# Patient Record
Sex: Female | Born: 1950 | Race: White | Hispanic: No | Marital: Married | Smoking: Current every day smoker
Health system: Southern US, Community
[De-identification: ages and names within clinical notes are randomized; demographics above are authoritative.]

## PROBLEM LIST (undated history)

## (undated) DIAGNOSIS — R51 Headache: Secondary | ICD-10-CM

## (undated) DIAGNOSIS — G459 Transient cerebral ischemic attack, unspecified: Secondary | ICD-10-CM

## (undated) DIAGNOSIS — E039 Hypothyroidism, unspecified: Secondary | ICD-10-CM

## (undated) DIAGNOSIS — R519 Headache, unspecified: Secondary | ICD-10-CM

## (undated) DIAGNOSIS — M1990 Unspecified osteoarthritis, unspecified site: Secondary | ICD-10-CM

## (undated) DIAGNOSIS — R112 Nausea with vomiting, unspecified: Secondary | ICD-10-CM

## (undated) DIAGNOSIS — Z9889 Other specified postprocedural states: Secondary | ICD-10-CM

## (undated) DIAGNOSIS — F419 Anxiety disorder, unspecified: Secondary | ICD-10-CM

## (undated) DIAGNOSIS — J449 Chronic obstructive pulmonary disease, unspecified: Secondary | ICD-10-CM

## (undated) DIAGNOSIS — I609 Nontraumatic subarachnoid hemorrhage, unspecified: Secondary | ICD-10-CM

## (undated) DIAGNOSIS — C519 Malignant neoplasm of vulva, unspecified: Secondary | ICD-10-CM

## (undated) DIAGNOSIS — I10 Essential (primary) hypertension: Secondary | ICD-10-CM

## (undated) DIAGNOSIS — I639 Cerebral infarction, unspecified: Secondary | ICD-10-CM

## (undated) DIAGNOSIS — E785 Hyperlipidemia, unspecified: Secondary | ICD-10-CM

## (undated) DIAGNOSIS — T884XXA Failed or difficult intubation, initial encounter: Secondary | ICD-10-CM

## (undated) DIAGNOSIS — G8929 Other chronic pain: Secondary | ICD-10-CM

## (undated) HISTORY — PX: BREAST SURGERY: SHX581

## (undated) HISTORY — PX: EYE SURGERY: SHX253

## (undated) HISTORY — DX: Nontraumatic subarachnoid hemorrhage, unspecified: I60.9

## (undated) HISTORY — PX: PLACEMENT OF BREAST IMPLANTS: SHX6334

## (undated) HISTORY — PX: ABDOMINAL HYSTERECTOMY: SHX81

## (undated) HISTORY — PX: TOTAL HIP ARTHROPLASTY: SHX124

## (undated) HISTORY — PX: JOINT REPLACEMENT: SHX530

## (undated) HISTORY — PX: THYROIDECTOMY, PARTIAL: SHX18

## (undated) HISTORY — PX: TONSILLECTOMY: SUR1361

---

## 2001-04-29 ENCOUNTER — Emergency Department (HOSPITAL_COMMUNITY): Admission: EM | Admit: 2001-04-29 | Discharge: 2001-04-29 | Payer: Self-pay | Admitting: Emergency Medicine

## 2002-11-19 ENCOUNTER — Ambulatory Visit (HOSPITAL_COMMUNITY): Admission: RE | Admit: 2002-11-19 | Discharge: 2002-11-19 | Payer: Self-pay | Admitting: General Surgery

## 2002-11-19 ENCOUNTER — Encounter: Payer: Self-pay | Admitting: General Surgery

## 2002-11-20 ENCOUNTER — Encounter: Payer: Self-pay | Admitting: General Surgery

## 2002-11-20 ENCOUNTER — Ambulatory Visit (HOSPITAL_COMMUNITY): Admission: RE | Admit: 2002-11-20 | Discharge: 2002-11-20 | Payer: Self-pay | Admitting: General Surgery

## 2003-01-05 ENCOUNTER — Encounter (HOSPITAL_COMMUNITY): Admission: RE | Admit: 2003-01-05 | Discharge: 2003-02-04 | Payer: Self-pay | Admitting: Family Medicine

## 2003-04-08 ENCOUNTER — Emergency Department (HOSPITAL_COMMUNITY): Admission: EM | Admit: 2003-04-08 | Discharge: 2003-04-08 | Payer: Self-pay | Admitting: Emergency Medicine

## 2003-04-08 ENCOUNTER — Encounter: Payer: Self-pay | Admitting: Emergency Medicine

## 2005-03-08 ENCOUNTER — Emergency Department: Payer: Self-pay | Admitting: Emergency Medicine

## 2006-10-04 ENCOUNTER — Emergency Department: Payer: Self-pay | Admitting: Emergency Medicine

## 2006-10-15 DIAGNOSIS — G459 Transient cerebral ischemic attack, unspecified: Secondary | ICD-10-CM

## 2006-10-15 HISTORY — DX: Transient cerebral ischemic attack, unspecified: G45.9

## 2006-10-17 ENCOUNTER — Ambulatory Visit: Payer: Self-pay | Admitting: Podiatry

## 2008-04-26 ENCOUNTER — Ambulatory Visit: Payer: Self-pay | Admitting: Internal Medicine

## 2008-08-15 ENCOUNTER — Ambulatory Visit: Payer: Self-pay | Admitting: Internal Medicine

## 2008-08-17 ENCOUNTER — Ambulatory Visit: Payer: Self-pay | Admitting: Internal Medicine

## 2008-11-09 ENCOUNTER — Ambulatory Visit: Payer: Self-pay | Admitting: Internal Medicine

## 2008-11-21 ENCOUNTER — Ambulatory Visit: Payer: Self-pay | Admitting: Family Medicine

## 2008-11-30 ENCOUNTER — Ambulatory Visit: Payer: Self-pay | Admitting: Internal Medicine

## 2008-12-07 ENCOUNTER — Ambulatory Visit: Payer: Self-pay

## 2009-01-03 ENCOUNTER — Ambulatory Visit: Payer: Self-pay

## 2009-01-03 ENCOUNTER — Encounter (INDEPENDENT_AMBULATORY_CARE_PROVIDER_SITE_OTHER): Payer: Self-pay | Admitting: Neurology

## 2009-03-11 ENCOUNTER — Ambulatory Visit: Payer: Self-pay | Admitting: Internal Medicine

## 2009-07-11 ENCOUNTER — Ambulatory Visit: Payer: Self-pay | Admitting: Neurology

## 2010-02-20 ENCOUNTER — Ambulatory Visit: Payer: Self-pay | Admitting: Internal Medicine

## 2010-08-29 ENCOUNTER — Ambulatory Visit: Payer: Self-pay | Admitting: Internal Medicine

## 2010-09-17 ENCOUNTER — Emergency Department: Payer: Self-pay | Admitting: Emergency Medicine

## 2010-09-19 ENCOUNTER — Emergency Department: Payer: Self-pay | Admitting: Emergency Medicine

## 2011-01-12 ENCOUNTER — Ambulatory Visit: Payer: Self-pay | Admitting: Internal Medicine

## 2011-01-18 ENCOUNTER — Ambulatory Visit: Payer: Self-pay | Admitting: Internal Medicine

## 2011-01-20 ENCOUNTER — Ambulatory Visit: Payer: Self-pay | Admitting: Family Medicine

## 2011-01-22 ENCOUNTER — Ambulatory Visit: Payer: Self-pay | Admitting: Internal Medicine

## 2011-06-14 ENCOUNTER — Ambulatory Visit: Payer: Self-pay

## 2012-05-27 ENCOUNTER — Emergency Department: Payer: Self-pay | Admitting: Emergency Medicine

## 2012-05-27 LAB — CBC WITH DIFFERENTIAL/PLATELET
Basophil #: 0.1 10*3/uL (ref 0.0–0.1)
Basophil %: 1.2 %
Eosinophil #: 0.2 10*3/uL (ref 0.0–0.7)
Eosinophil %: 2.1 %
HCT: 43.5 % (ref 35.0–47.0)
HGB: 14.8 g/dL (ref 12.0–16.0)
Lymphocyte #: 3.1 10*3/uL (ref 1.0–3.6)
Lymphocyte %: 38.3 %
MCH: 33.5 pg (ref 26.0–34.0)
MCHC: 33.9 g/dL (ref 32.0–36.0)
MCV: 99 fL (ref 80–100)
Monocyte #: 0.8 x10 3/mm (ref 0.2–0.9)
Monocyte %: 9.4 %
Neutrophil #: 4 10*3/uL (ref 1.4–6.5)
Neutrophil %: 49 %
Platelet: 273 10*3/uL (ref 150–440)
RBC: 4.4 10*6/uL (ref 3.80–5.20)
RDW: 13.4 % (ref 11.5–14.5)
WBC: 8.1 10*3/uL (ref 3.6–11.0)

## 2012-05-27 LAB — COMPREHENSIVE METABOLIC PANEL
Albumin: 3.8 g/dL (ref 3.4–5.0)
Alkaline Phosphatase: 209 U/L — ABNORMAL HIGH (ref 50–136)
Anion Gap: 10 (ref 7–16)
BUN: 13 mg/dL (ref 7–18)
Bilirubin,Total: 0.5 mg/dL (ref 0.2–1.0)
Calcium, Total: 8.9 mg/dL (ref 8.5–10.1)
Chloride: 105 mmol/L (ref 98–107)
Co2: 24 mmol/L (ref 21–32)
Creatinine: 0.78 mg/dL (ref 0.60–1.30)
EGFR (African American): >60
EGFR (Non-African Amer.): >60
Glucose: 104 mg/dL — ABNORMAL HIGH (ref 65–99)
Osmolality: 278 (ref 275–301)
Potassium: 3.1 mmol/L — ABNORMAL LOW (ref 3.5–5.1)
SGOT(AST): 21 U/L (ref 15–37)
SGPT (ALT): 17 U/L (ref 12–78)
Sodium: 139 mmol/L (ref 136–145)
Total Protein: 7.7 g/dL (ref 6.4–8.2)

## 2012-05-27 LAB — TROPONIN I: Troponin-I: <0.02 ng/mL

## 2012-05-27 LAB — TSH: Thyroid Stimulating Horm: 1.85 u[IU]/mL

## 2012-12-09 ENCOUNTER — Ambulatory Visit: Payer: Self-pay

## 2012-12-09 LAB — URINALYSIS, COMPLETE
Bilirubin,UR: NEGATIVE
Glucose,UR: NEGATIVE mg/dL (ref 0–75)
Ketone: NEGATIVE
Nitrite: NEGATIVE
Ph: 6.5 (ref 4.5–8.0)
Protein: NEGATIVE
Specific Gravity: 1.01 (ref 1.003–1.030)
WBC UR: >30 /HPF (ref 0–5)

## 2012-12-11 LAB — URINE CULTURE

## 2014-07-10 ENCOUNTER — Inpatient Hospital Stay: Payer: Self-pay | Admitting: Internal Medicine

## 2014-07-10 LAB — URINALYSIS, COMPLETE
BACTERIA: NONE SEEN
BILIRUBIN, UR: NEGATIVE
BLOOD: NEGATIVE
Glucose,UR: NEGATIVE mg/dL (ref 0–75)
KETONE: NEGATIVE
LEUKOCYTE ESTERASE: NEGATIVE
Nitrite: NEGATIVE
PROTEIN: NEGATIVE
Ph: 5 (ref 4.5–8.0)
RBC,UR: 1 /HPF (ref 0–5)
SQUAMOUS EPITHELIAL: NONE SEEN
Specific Gravity: 1.011 (ref 1.003–1.030)
WBC UR: <1 /HPF (ref 0–5)

## 2014-07-10 LAB — BASIC METABOLIC PANEL
Anion Gap: 7 (ref 7–16)
BUN: 17 mg/dL (ref 7–18)
CHLORIDE: 106 mmol/L (ref 98–107)
CREATININE: 1.03 mg/dL (ref 0.60–1.30)
Calcium, Total: 9 mg/dL (ref 8.5–10.1)
Co2: 25 mmol/L (ref 21–32)
EGFR (African American): >60
EGFR (Non-African Amer.): 57 — ABNORMAL LOW
Glucose: 90 mg/dL (ref 65–99)
Osmolality: 277 (ref 275–301)
POTASSIUM: 3.7 mmol/L (ref 3.5–5.1)
SODIUM: 138 mmol/L (ref 136–145)

## 2014-07-10 LAB — CBC WITH DIFFERENTIAL/PLATELET
BASOS ABS: 0.1 10*3/uL (ref 0.0–0.1)
BASOS PCT: 1.1 %
EOS PCT: 1.8 %
Eosinophil #: 0.1 10*3/uL (ref 0.0–0.7)
HCT: 40.3 % (ref 35.0–47.0)
HGB: 13.4 g/dL (ref 12.0–16.0)
LYMPHS ABS: 2.2 10*3/uL (ref 1.0–3.6)
LYMPHS PCT: 30.2 %
MCH: 34.4 pg — AB (ref 26.0–34.0)
MCHC: 33.2 g/dL (ref 32.0–36.0)
MCV: 104 fL — AB (ref 80–100)
Monocyte #: 0.6 x10 3/mm (ref 0.2–0.9)
Monocyte %: 7.7 %
NEUTROS ABS: 4.4 10*3/uL (ref 1.4–6.5)
Neutrophil %: 59.2 %
PLATELETS: 262 10*3/uL (ref 150–440)
RBC: 3.89 10*6/uL (ref 3.80–5.20)
RDW: 13.7 % (ref 11.5–14.5)
WBC: 7.5 10*3/uL (ref 3.6–11.0)

## 2014-07-10 LAB — PROTIME-INR
INR: 0.9
Prothrombin Time: 11.8 secs (ref 11.5–14.7)

## 2014-07-10 LAB — APTT: Activated PTT: 28.4 secs (ref 23.6–35.9)

## 2014-07-11 LAB — CBC WITH DIFFERENTIAL/PLATELET
Basophil #: 0.1 10*3/uL (ref 0.0–0.1)
Basophil %: 1.3 %
EOS PCT: 2.2 %
Eosinophil #: 0.1 10*3/uL (ref 0.0–0.7)
HCT: 37.5 % (ref 35.0–47.0)
HGB: 12.8 g/dL (ref 12.0–16.0)
LYMPHS PCT: 37.6 %
Lymphocyte #: 1.9 10*3/uL (ref 1.0–3.6)
MCH: 35.3 pg — ABNORMAL HIGH (ref 26.0–34.0)
MCHC: 34.1 g/dL (ref 32.0–36.0)
MCV: 104 fL — ABNORMAL HIGH (ref 80–100)
MONO ABS: 0.4 x10 3/mm (ref 0.2–0.9)
Monocyte %: 7.6 %
NEUTROS PCT: 51.3 %
Neutrophil #: 2.6 10*3/uL (ref 1.4–6.5)
Platelet: 246 10*3/uL (ref 150–440)
RBC: 3.62 10*6/uL — ABNORMAL LOW (ref 3.80–5.20)
RDW: 13.4 % (ref 11.5–14.5)
WBC: 5 10*3/uL (ref 3.6–11.0)

## 2014-07-11 LAB — BASIC METABOLIC PANEL
Anion Gap: 6 — ABNORMAL LOW (ref 7–16)
BUN: 12 mg/dL (ref 7–18)
CALCIUM: 7.8 mg/dL — AB (ref 8.5–10.1)
Chloride: 114 mmol/L — ABNORMAL HIGH (ref 98–107)
Co2: 23 mmol/L (ref 21–32)
Creatinine: 0.6 mg/dL (ref 0.60–1.30)
EGFR (African American): >60
GLUCOSE: 76 mg/dL (ref 65–99)
Osmolality: 283 (ref 275–301)
Potassium: 3.7 mmol/L (ref 3.5–5.1)
Sodium: 143 mmol/L (ref 136–145)

## 2014-07-12 LAB — CBC WITH DIFFERENTIAL/PLATELET
BASOS ABS: 0.1 10*3/uL (ref 0.0–0.1)
BASOS PCT: 0.7 %
EOS ABS: 0 10*3/uL (ref 0.0–0.7)
EOS PCT: 0.3 %
HCT: 36.2 % (ref 35.0–47.0)
HGB: 12.3 g/dL (ref 12.0–16.0)
Lymphocyte #: 1 10*3/uL (ref 1.0–3.6)
Lymphocyte %: 12.9 %
MCH: 35.2 pg — AB (ref 26.0–34.0)
MCHC: 34 g/dL (ref 32.0–36.0)
MCV: 104 fL — AB (ref 80–100)
Monocyte #: 0.7 x10 3/mm (ref 0.2–0.9)
Monocyte %: 8.9 %
NEUTROS ABS: 5.9 10*3/uL (ref 1.4–6.5)
Neutrophil %: 77.2 %
Platelet: 252 10*3/uL (ref 150–440)
RBC: 3.49 10*6/uL — ABNORMAL LOW (ref 3.80–5.20)
RDW: 13.5 % (ref 11.5–14.5)
WBC: 7.7 10*3/uL (ref 3.6–11.0)

## 2014-07-12 LAB — BASIC METABOLIC PANEL
Anion Gap: 7 (ref 7–16)
BUN: 6 mg/dL — ABNORMAL LOW (ref 7–18)
CALCIUM: 8.1 mg/dL — AB (ref 8.5–10.1)
CHLORIDE: 108 mmol/L — AB (ref 98–107)
Co2: 24 mmol/L (ref 21–32)
Creatinine: 0.53 mg/dL — ABNORMAL LOW (ref 0.60–1.30)
GLUCOSE: 137 mg/dL — AB (ref 65–99)
Osmolality: 277 (ref 275–301)
POTASSIUM: 4.1 mmol/L (ref 3.5–5.1)
Sodium: 139 mmol/L (ref 136–145)

## 2014-07-13 ENCOUNTER — Encounter: Payer: Self-pay | Admitting: Internal Medicine

## 2014-07-13 LAB — PATHOLOGY REPORT

## 2014-07-13 LAB — HEMOGLOBIN: HGB: 11.9 g/dL — ABNORMAL LOW (ref 12.0–16.0)

## 2014-07-15 ENCOUNTER — Encounter: Payer: Self-pay | Admitting: Internal Medicine

## 2014-07-17 LAB — URINALYSIS, COMPLETE
BILIRUBIN, UR: NEGATIVE
Blood: NEGATIVE
Glucose,UR: NEGATIVE mg/dL (ref 0–75)
Ketone: NEGATIVE
Leukocyte Esterase: NEGATIVE
NITRITE: NEGATIVE
Ph: 6 (ref 4.5–8.0)
Protein: NEGATIVE
RBC,UR: 19 /HPF (ref 0–5)
Specific Gravity: 1.018 (ref 1.003–1.030)
Squamous Epithelial: 3
WBC UR: NONE SEEN /HPF (ref 0–5)

## 2014-07-19 LAB — URINE CULTURE

## 2014-07-21 ENCOUNTER — Ambulatory Visit: Payer: Self-pay | Admitting: Internal Medicine

## 2014-08-03 ENCOUNTER — Encounter: Payer: Self-pay | Admitting: Emergency Medicine

## 2014-08-15 ENCOUNTER — Encounter: Payer: Self-pay | Admitting: Internal Medicine

## 2014-08-15 ENCOUNTER — Encounter: Payer: Self-pay | Admitting: Emergency Medicine

## 2014-09-14 ENCOUNTER — Encounter: Payer: Self-pay | Admitting: Internal Medicine

## 2014-09-14 ENCOUNTER — Encounter: Payer: Self-pay | Admitting: Emergency Medicine

## 2014-10-15 ENCOUNTER — Encounter: Payer: Self-pay | Admitting: Emergency Medicine

## 2014-10-15 ENCOUNTER — Encounter: Payer: Self-pay | Admitting: Internal Medicine

## 2015-02-05 NOTE — Discharge Summary (Signed)
PATIENT NAME:  Sydney Coleman, Sydney Coleman MR#:  [id number] DATE OF BIRTH:  09-Sep-1951  DATE OF ADMISSION:  07/10/2014 DATE OF DISCHARGE:  07/14/2104  DISCHARGE DIAGNOSES:  1.  Left hip fracture.  2.  Hypertension.  3.  History of cerebrovascular accident.  4.  Tobacco dependence.  PROCEDURE: Left hip hemiarthroplasty.  CONDITION: Stable.   CODE STATUS: Full code.   HOME MEDICATIONS: Please medication reconciliation list.   DIET: Low-sodium diet.   ACTIVITY: As tolerated.   FOLLOWUP CARE: Follow-up with PCP and Dr. Mack Guise within 1-2 weeks.   REASON FOR ADMISSION: Left hip pain.   CONSULTATIONS: Timoteo Gaul, MD.  HOSPITAL COURSE: The patient is a 64 year old Caucasian female with a history of hypertension, hyperlipidemia, history of cerebrovascular accident who presented to the ED with  left hip pain for 1 week, worsening Monday. The patient got a CT of the hip which showed subcapital fracture of the left femur. For detailed history and physical examination please refer to the admission note dictated by Dr. Tressia Miners.   ADMISSION LABORATORY DATA:  Unremarkable.   Chest x-ray showed vascular congestion and chronic bronchitis.   1.  For the left hip fracture, after admission Dr. Mack Guise did a hemiarthroplasty. After surgery, the patient was getting physical therapy, but she could not tolerate PT well. Physical therapy evaluation suggested the patient needed to go to a skilled nursing facility. Physical therapist suggested that the patient needed subacute rehabilitation.  2.  Hypertension, has been controlled.  3.  Tobacco abuse.   The patient has no complaints. Vital signs are stable. She is clinically stable and will be discharged to subacute rehabilitation today. I discussed the patient's discharge plan with the patient, the patient's husband, nurse and case Metallurgist.   TIME SPENT: About 36 minutes.    ____________________________ Demetrios Loll,  MD qc:lt D: 07/14/2014 10:31:59 ET T: 07/14/2014 10:58:23 ET JOB#: [id number]  cc: Demetrios Loll, MD, <Dictator> Demetrios Loll MD ELECTRONICALLY SIGNED 07/14/2014 13:10
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
## 2015-03-29 DIAGNOSIS — H18899 Other specified disorders of cornea, unspecified eye: Secondary | ICD-10-CM | POA: Diagnosis not present

## 2015-05-03 DIAGNOSIS — E780 Pure hypercholesterolemia: Secondary | ICD-10-CM | POA: Diagnosis not present

## 2015-05-03 DIAGNOSIS — E782 Mixed hyperlipidemia: Secondary | ICD-10-CM | POA: Diagnosis not present

## 2015-05-03 DIAGNOSIS — Z720 Tobacco use: Secondary | ICD-10-CM | POA: Diagnosis not present

## 2015-05-03 DIAGNOSIS — I10 Essential (primary) hypertension: Secondary | ICD-10-CM | POA: Diagnosis not present

## 2015-05-11 ENCOUNTER — Encounter: Payer: Self-pay | Admitting: *Deleted

## 2015-05-11 ENCOUNTER — Other Ambulatory Visit: Payer: Self-pay

## 2015-05-11 ENCOUNTER — Emergency Department
Admission: EM | Admit: 2015-05-11 | Discharge: 2015-05-12 | Disposition: A | Discharge status: Other Acute Inpatient Hospital | Payer: Medicare Other | Attending: Emergency Medicine | Admitting: Emergency Medicine

## 2015-05-11 ENCOUNTER — Emergency Department: Payer: Medicare Other

## 2015-05-11 DIAGNOSIS — F911 Conduct disorder, childhood-onset type: Secondary | ICD-10-CM | POA: Diagnosis not present

## 2015-05-11 DIAGNOSIS — I609 Nontraumatic subarachnoid hemorrhage, unspecified: Secondary | ICD-10-CM | POA: Diagnosis not present

## 2015-05-11 DIAGNOSIS — Z720 Tobacco use: Secondary | ICD-10-CM | POA: Diagnosis not present

## 2015-05-11 DIAGNOSIS — R4701 Aphasia: Secondary | ICD-10-CM | POA: Diagnosis not present

## 2015-05-11 DIAGNOSIS — R4182 Altered mental status, unspecified: Secondary | ICD-10-CM | POA: Diagnosis present

## 2015-05-11 DIAGNOSIS — R32 Unspecified urinary incontinence: Secondary | ICD-10-CM | POA: Diagnosis not present

## 2015-05-11 DIAGNOSIS — F1310 Sedative, hypnotic or anxiolytic abuse, uncomplicated: Secondary | ICD-10-CM | POA: Diagnosis not present

## 2015-05-11 HISTORY — DX: Nontraumatic subarachnoid hemorrhage, unspecified: I60.9

## 2015-05-11 LAB — URINE DRUG SCREEN, QUALITATIVE (ARMC ONLY)
AMPHETAMINES, UR SCREEN: NOT DETECTED
Barbiturates, Ur Screen: POSITIVE — AB
Benzodiazepine, Ur Scrn: POSITIVE — AB
Cannabinoid 50 Ng, Ur ~~LOC~~: NOT DETECTED
Cocaine Metabolite,Ur ~~LOC~~: NOT DETECTED
MDMA (Ecstasy)Ur Screen: NOT DETECTED
Methadone Scn, Ur: NOT DETECTED
Opiate, Ur Screen: NOT DETECTED
PHENCYCLIDINE (PCP) UR S: NOT DETECTED
Tricyclic, Ur Screen: NOT DETECTED

## 2015-05-11 LAB — COMPREHENSIVE METABOLIC PANEL
ALK PHOS: 219 U/L — AB (ref 38–126)
ALT: 13 U/L — AB (ref 14–54)
ANION GAP: 9 (ref 5–15)
AST: 16 U/L (ref 15–41)
Albumin: 4.3 g/dL (ref 3.5–5.0)
BUN: 11 mg/dL (ref 6–20)
CHLORIDE: 101 mmol/L (ref 101–111)
CO2: 28 mmol/L (ref 22–32)
CREATININE: 1 mg/dL (ref 0.44–1.00)
Calcium: 9.6 mg/dL (ref 8.9–10.3)
GFR calc Af Amer: >60 mL/min (ref >60)
GFR calc non Af Amer: 58 mL/min — ABNORMAL LOW (ref >60)
GLUCOSE: 103 mg/dL — AB (ref 65–99)
Potassium: 3.7 mmol/L (ref 3.5–5.1)
SODIUM: 138 mmol/L (ref 135–145)
Total Bilirubin: 0.8 mg/dL (ref 0.3–1.2)
Total Protein: 8 g/dL (ref 6.5–8.1)

## 2015-05-11 LAB — CBC
HEMATOCRIT: 45.3 % (ref 35.0–47.0)
Hemoglobin: 15.7 g/dL (ref 12.0–16.0)
MCH: 34.7 pg — ABNORMAL HIGH (ref 26.0–34.0)
MCHC: 34.7 g/dL (ref 32.0–36.0)
MCV: 99.9 fL (ref 80.0–100.0)
Platelets: 293 10*3/uL (ref 150–440)
RBC: 4.54 MIL/uL (ref 3.80–5.20)
RDW: 14.6 % — AB (ref 11.5–14.5)
WBC: 9.4 10*3/uL (ref 3.6–11.0)

## 2015-05-11 LAB — URINALYSIS COMPLETE WITH MICROSCOPIC (ARMC ONLY)
Bilirubin Urine: NEGATIVE
GLUCOSE, UA: NEGATIVE mg/dL
Hgb urine dipstick: NEGATIVE
NITRITE: POSITIVE — AB
Protein, ur: 30 mg/dL — AB
Specific Gravity, Urine: 1.025 (ref 1.005–1.030)
pH: 5 (ref 5.0–8.0)

## 2015-05-11 LAB — ETHANOL: Alcohol, Ethyl (B): <5 mg/dL (ref <5)

## 2015-05-11 NOTE — ED Provider Notes (Signed)
[facility] Emergency Department Provider Note  ____________________________________________  Time seen: 9:30 PM  I have reviewed the triage vital signs and the nursing notes.   HISTORY  Chief Complaint Altered Mental Status    HPI Sydney Coleman is a 64 y.o. female who presents with altered mental status. Per daughter yesterday patient complained of a mild headache and "feeling funny". Later in the day she began having hallucinations where she would report seeing orange "goop" on the ground and would be picking it up but there was nothing there. Police were called on patient was walking in the street in her underwear and T-shirt. Daughter reports this is completely unlike her mother. No history of psychiatric illness. Patient moving all x-rays well. No fevers chills reported. Is unclear if she is on blood thinners. No trauma reported     No past medical history on file.  There are no active problems to display for this patient.   No past surgical history on file.  No current outpatient prescriptions on file.  Allergies Amoxil  No family history on file.  Social History History  Substance Use Topics  . Smoking status: Current Every Day Smoker  . Smokeless tobacco: Not on file  . Alcohol Use: No    Review of Systems  The noted secondary to altered mental status  Constitutional: Negative for fever. Eyes: No visual changes reported ENT: No neck pain Cardiovascular: Negative for chest pain. Respiratory: Negative for shortness of breath. Gastrointestinal: No nausea Genitourinary: Patient has a chronic history of incontinence Musculoskeletal: Negative for back pain. Skin: Negative for abrasions or injuries Neurological: Possible headache yesterday Psychiatric: Slightly aggressive   ____________________________________________   PHYSICAL EXAM:  VITAL SIGNS: ED Triage Vitals  Enc Vitals Group     BP 05/11/15 2031 137/77 mmHg     Pulse  Rate 05/11/15 2031 99     Resp 05/11/15 2031 20     Temp 05/11/15 2031 97.8 F (36.6 C)     Temp Source 05/11/15 2031 Oral     SpO2 05/11/15 2031 100 %     Weight 05/11/15 2031 135 lb (61.236 kg)     Height 05/11/15 2031 5\' 3"  (1.6 m)     Head Cir --      Peak Flow --      Pain Score 05/11/15 2141 4     Pain Loc --      Pain Edu? --      Excl. in [place]? --      Constitutional: Awake and alert. Eyes: Conjunctivae are normal. PERRLA. EOMI  ENT   Head: Normocephalic and atraumatic.   Mouth/Throat: Mucous membranes are moist. Cardiovascular: Normal rate, regular rhythm. Normal and symmetric distal pulses are present in all extremities. No murmurs, rubs, or gallops. Respiratory: Normal respiratory effort without tachypnea nor retractions. Breath sounds are clear and equal bilaterally.  Gastrointestinal: Soft and non-tender in all quadrants. No distention. There is no CVA tenderness. Genitourinary: deferred Musculoskeletal: Nontender with normal range of motion in all extremities. No lower extremity tenderness nor edema. Neurologic:  Patient appears to be demonstrating some aphasia. Her answers are bizarre frequently but sometimes they are appropriate. Cranial nerves II through XII appear to be intact Skin:  Skin is warm, dry and intact. No rash noted. No lacerations or abrasions Psychiatric: Patient is angry and somewhat aggressive  ____________________________________________    LABS (pertinent positives/negatives)  Labs Reviewed  COMPREHENSIVE METABOLIC PANEL - Abnormal; Notable for the following:  Glucose, Bld 103 (*)    ALT 13 (*)    Alkaline Phosphatase 219 (*)    GFR calc non Af Amer 58 (*)    All other components within normal limits  CBC - Abnormal; Notable for the following:    MCH 34.7 (*)    RDW 14.6 (*)    All other components within normal limits  URINE DRUG SCREEN, QUALITATIVE ([facility] ONLY) - Abnormal; Notable for the following:    Barbiturates, Ur Screen  POSITIVE (*)    Benzodiazepine, Ur Scrn POSITIVE (*)    All other components within normal limits  ETHANOL  URINALYSIS COMPLETEWITH MICROSCOPIC ([facility] ONLY)    ____________________________________________   EKG  ED ECG REPORT I, Lavonia Drafts, the attending physician, personally viewed and interpreted this ECG.  Date: 05/11/2015 EKG Time: 8:39 PM Rate: 89 Rhythm: normal sinus rhythm QRS Axis: normal Intervals: normal ST/T Wave abnormalities: normal Conduction Disutrbances: none Narrative Interpretation: unremarkable   ____________________________________________    RADIOLOGY I have personally reviewed any xrays that were ordered on this patient: CT head shows left frontal subarachnoid hemorrhage  I discussed this with the radiologist  ____________________________________________   PROCEDURES  Procedure(s) performed: none  Critical Care performed: yes  CRITICAL CARE Performed by: Lavonia Drafts   Total critical care time: 40  Critical care time was exclusive of separately billable procedures and treating other patients.  Critical care was necessary to treat or prevent imminent or life-threatening deterioration.  Critical care was time spent personally by me on the following activities: development of treatment plan with patient and/or surrogate as well as nursing, discussions with consultants, evaluation of patient's response to treatment, examination of patient, obtaining history from patient or surrogate, ordering and performing treatments and interventions, ordering and review of laboratory studies, ordering and review of radiographic studies, pulse oximetry and re-evaluation of patient's condition.   ____________________________________________   INITIAL IMPRESSION / ASSESSMENT AND PLAN / ED COURSE  Pertinent labs & imaging results that were available during my care of the patient were reviewed by me and considered in my medical decision making (see  chart for details).  Patient with bizarre behavior. Unclear whether this may be psychiatric versus neurologic  Called by radiology with report of a subarachnoid hemorrhage. This likely explains aphasia and mood swings noted by daughter.  D/W Dr. Cyndy Freeze of Neurosurgery at [facility]. He feels this is more a neurology case.  Accepted by Dr. Wallie Char of Neurology at [facility].  ----------------------------------------- 10:33 PM on 05/11/2015 -----------------------------------------  Patient is threatening to leave. I do not believe that she has decisional capacity at this time and her daughter agrees with me given her significant mood and behavior changes and hallucinations. I will involuntarily commit her. Her blood pressure is stable currently as is her heart rate.  ____________________________________________   FINAL CLINICAL IMPRESSION(S) / ED DIAGNOSES  Final diagnoses:  Subarachnoid hemorrhage     Lavonia Drafts, MD 05/11/15 2235

## 2015-05-11 NOTE — ED Notes (Signed)
Pt ambulatory to triage with confusion. Hx TIA's.  Sx began yesterday.  Speech clear. Pt denies headache at this time.  Daughter reports that pt has been hallucinating since yesterday.  Denies drug use or ETOH use.  Pt reports seeing orange goop and today was outside down the street in her underwear and tee shirt.  Pt calm and cooperative in triage

## 2015-05-12 ENCOUNTER — Encounter (HOSPITAL_COMMUNITY): Payer: Self-pay | Admitting: Neurology

## 2015-05-12 ENCOUNTER — Inpatient Hospital Stay (HOSPITAL_COMMUNITY)
Admission: EM | Admit: 2015-05-12 | Discharge: 2015-05-13 | DRG: 065 | Disposition: A | Discharge status: Home / Self Care | Payer: Medicare Other | Source: Other Acute Inpatient Hospital | Attending: Neurology | Admitting: Neurology

## 2015-05-12 ENCOUNTER — Inpatient Hospital Stay (HOSPITAL_COMMUNITY): Payer: Medicare Other

## 2015-05-12 DIAGNOSIS — J439 Emphysema, unspecified: Secondary | ICD-10-CM | POA: Diagnosis not present

## 2015-05-12 DIAGNOSIS — Z823 Family history of stroke: Secondary | ICD-10-CM | POA: Diagnosis not present

## 2015-05-12 DIAGNOSIS — F209 Schizophrenia, unspecified: Secondary | ICD-10-CM | POA: Diagnosis present

## 2015-05-12 DIAGNOSIS — I639 Cerebral infarction, unspecified: Secondary | ICD-10-CM | POA: Diagnosis not present

## 2015-05-12 DIAGNOSIS — R2990 Unspecified symptoms and signs involving the nervous system: Secondary | ICD-10-CM

## 2015-05-12 DIAGNOSIS — F17210 Nicotine dependence, cigarettes, uncomplicated: Secondary | ICD-10-CM | POA: Diagnosis not present

## 2015-05-12 DIAGNOSIS — Z79899 Other long term (current) drug therapy: Secondary | ICD-10-CM | POA: Diagnosis not present

## 2015-05-12 DIAGNOSIS — I609 Nontraumatic subarachnoid hemorrhage, unspecified: Secondary | ICD-10-CM | POA: Diagnosis not present

## 2015-05-12 DIAGNOSIS — I10 Essential (primary) hypertension: Secondary | ICD-10-CM | POA: Diagnosis not present

## 2015-05-12 DIAGNOSIS — I700 Atherosclerosis of aorta: Secondary | ICD-10-CM | POA: Diagnosis not present

## 2015-05-12 DIAGNOSIS — I638 Other cerebral infarction: Secondary | ICD-10-CM | POA: Diagnosis not present

## 2015-05-12 DIAGNOSIS — F419 Anxiety disorder, unspecified: Secondary | ICD-10-CM | POA: Diagnosis not present

## 2015-05-12 DIAGNOSIS — R441 Visual hallucinations: Secondary | ICD-10-CM | POA: Diagnosis present

## 2015-05-12 DIAGNOSIS — F319 Bipolar disorder, unspecified: Secondary | ICD-10-CM | POA: Diagnosis not present

## 2015-05-12 DIAGNOSIS — R4701 Aphasia: Secondary | ICD-10-CM | POA: Diagnosis not present

## 2015-05-12 DIAGNOSIS — Z8673 Personal history of transient ischemic attack (TIA), and cerebral infarction without residual deficits: Secondary | ICD-10-CM

## 2015-05-12 DIAGNOSIS — Z0000 Encounter for general adult medical examination without abnormal findings: Secondary | ICD-10-CM

## 2015-05-12 DIAGNOSIS — R4182 Altered mental status, unspecified: Secondary | ICD-10-CM

## 2015-05-12 DIAGNOSIS — Z01818 Encounter for other preprocedural examination: Secondary | ICD-10-CM

## 2015-05-12 HISTORY — DX: Essential (primary) hypertension: I10

## 2015-05-12 HISTORY — DX: Transient cerebral ischemic attack, unspecified: G45.9

## 2015-05-12 HISTORY — DX: Nontraumatic subarachnoid hemorrhage, unspecified: I60.9

## 2015-05-12 HISTORY — DX: Malignant neoplasm of vulva, unspecified: C51.9

## 2015-05-12 HISTORY — DX: Headache: R51

## 2015-05-12 HISTORY — DX: Anxiety disorder, unspecified: F41.9

## 2015-05-12 HISTORY — DX: Headache, unspecified: R51.9

## 2015-05-12 HISTORY — DX: Other chronic pain: G89.29

## 2015-05-12 HISTORY — DX: Failed or difficult intubation, initial encounter: T88.4XXA

## 2015-05-12 LAB — GLUCOSE, CAPILLARY: Glucose-Capillary: 91 mg/dL (ref 65–99)

## 2015-05-12 LAB — APTT: aPTT: 20 seconds — ABNORMAL LOW (ref 24–37)

## 2015-05-12 LAB — SEDIMENTATION RATE: Sed Rate: 5 mm/hr (ref 0–22)

## 2015-05-12 LAB — PROTIME-INR
INR: 1.08 (ref 0.00–1.49)
Prothrombin Time: 14.2 seconds (ref 11.6–15.2)

## 2015-05-12 LAB — MRSA PCR SCREENING: MRSA by PCR: NEGATIVE

## 2015-05-12 MED ORDER — PANTOPRAZOLE SODIUM 40 MG IV SOLR
40.0000 mg | Freq: Every day | INTRAVENOUS | Status: DC
  Administered 2015-05-12: 40 mg via INTRAVENOUS
  Filled 2015-05-12 (×2): qty 40

## 2015-05-12 MED ORDER — ACETAMINOPHEN 325 MG PO TABS: 650.0000 mg | ORAL_TABLET | ORAL | Status: DC | PRN

## 2015-05-12 MED ORDER — LORAZEPAM 2 MG/ML IJ SOLN
0.5000 mg | Freq: Three times a day (TID) | INTRAMUSCULAR | Status: DC | PRN
  Administered 2015-05-13: 0.5 mg via INTRAVENOUS
  Filled 2015-05-12 (×2): qty 1

## 2015-05-12 MED ORDER — GADOBENATE DIMEGLUMINE 529 MG/ML IV SOLN: 13.0000 mL | Freq: Once | INTRAVENOUS | Status: AC | PRN

## 2015-05-12 MED ORDER — ACETAMINOPHEN-CODEINE #3 300-30 MG PO TABS
1.0000 | ORAL_TABLET | ORAL | Status: DC | PRN
  Administered 2015-05-12: 2 via ORAL
  Filled 2015-05-12: qty 2

## 2015-05-12 MED ORDER — ACETAMINOPHEN 650 MG RE SUPP: 650.0000 mg | RECTAL | Status: DC | PRN

## 2015-05-12 MED ORDER — SODIUM CHLORIDE 0.9 % IV SOLN
INTRAVENOUS | Status: DC
  Administered 2015-05-12 – 2015-05-13 (×2): via INTRAVENOUS

## 2015-05-12 MED ORDER — LABETALOL HCL 5 MG/ML IV SOLN: 10.0000 mg | INTRAVENOUS | Status: DC | PRN

## 2015-05-12 MED ORDER — STROKE: EARLY STAGES OF RECOVERY BOOK
Freq: Once | Status: AC
  Administered 2015-05-12: 1
  Filled 2015-05-12: qty 1

## 2015-05-12 MED ORDER — SENNOSIDES-DOCUSATE SODIUM 8.6-50 MG PO TABS
1.0000 | ORAL_TABLET | Freq: Two times a day (BID) | ORAL | Status: DC
Start: 2015-05-12 — End: 2015-05-14
  Administered 2015-05-12: 1 via ORAL
  Filled 2015-05-12 (×5): qty 1

## 2015-05-12 MED ORDER — NICOTINE 21 MG/24HR TD PT24
21.0000 mg | MEDICATED_PATCH | Freq: Every day | TRANSDERMAL | Status: DC
  Filled 2015-05-12 (×2): qty 1

## 2015-05-12 NOTE — Progress Notes (Signed)
At this time, patient is refusing insertion of IV.  At this time patient has no IV access.  Necessity for the IV access has been thoroughly explained to the patient and she is still refusing the IV.

## 2015-05-12 NOTE — H&P (Signed)
Admission H&P    Chief Complaint: Altered mental status with subarachnoid hemorrhage demonstrated on CT of the head.  HPI: Sydney Coleman is an 64 y.o. female with a history of hypertension, TIAs, anxiety and chronic headaches, who presented to the emergency room and [facility] for acute change in mental status. Exact onset is unclear. Her daughter thinks that symptoms may have started last night a possible early this morning with confusion and at times visual hallucinations. This evening she was noted to be outside of her house wearing underwear and T-shirt, and was subsequently brought to the emergency room. CT scan of her head showed mild acute subarachnoid hemorrhage involving left frontal region. There was no mass effect. There is no history of recent trauma. Laboratory studies were unremarkable except for urinalysis which showed findings indicative of possible UTI. Patient has been afebrile.  LSN: Unclear tPA Given: No: Acute SAH mRankin:  Past Medical History  Diagnosis Date  . TIA (transient ischemic attack)   . Hypertension   . Chronic headaches   . Anxiety     No past surgical history on file.  Family history: Positive for stroke involving her mother.  Social History:  reports that she has been smoking.  She does not have any smokeless tobacco history on file. She reports that she does not drink alcohol. Her drug history is not on file.  Allergies:  Allergies  Allergen Reactions  . Amoxil [Amoxicillin] Rash   Medications: Patient's preadmission medications were reviewed by me.  ROS: History obtained from patient's daughter.  General ROS: negative for - chills, fatigue, fever, night sweats, weight gain or weight loss Psychological ROS: negative for - behavioral disorder, hallucinations, memory difficulties, mood swings or suicidal ideation Ophthalmic ROS: negative for - blurry vision, double vision, eye pain or loss of vision ENT ROS: negative for - epistaxis, nasal  discharge, oral lesions, sore throat, tinnitus or vertigo Allergy and Immunology ROS: negative for - hives or itchy/watery eyes Hematological and Lymphatic ROS: negative for - bleeding problems, bruising or swollen lymph nodes Endocrine ROS: negative for - galactorrhea, hair pattern changes, polydipsia/polyuria or temperature intolerance Respiratory ROS: negative for - cough, hemoptysis, shortness of breath or wheezing Cardiovascular ROS: negative for - chest pain, dyspnea on exertion, edema or irregular heartbeat Gastrointestinal ROS: negative for - abdominal pain, diarrhea, hematemesis, nausea/vomiting or stool incontinence Genito-Urinary ROS: negative for - dysuria, hematuria, incontinence or urinary frequency/urgency Musculoskeletal ROS: negative for - joint swelling or muscular weakness Neurological ROS: as noted in HPI Dermatological ROS: negative for rash and skin lesion changes  Physical Examination: Vital signs: Temp 97.6; pulse 72/m; respirations 18/m; BP 100/50; O2 sat 97%.  HEENT-  Normocephalic, no lesions, without obvious abnormality.  Normal external eye and conjunctiva.  Normal TM's bilaterally.  Normal auditory canals and external ears. Normal external nose, mucus membranes and septum.  Normal pharynx. Neck supple with no masses, nodes, nodules or enlargement. Cardiovascular - regular rate and rhythm, S1, S2 normal, no murmur, click, rub or gallop Lungs - chest clear, no wheezing, rales, normal symmetric air entry Abdomen - soft, non-tender; bowel sounds normal; no masses,  no organomegaly Extremities - no joint deformities, effusion, or inflammation, no edema and no skin discoloration  Neurologic Examination: Mental Status: Alert, oriented, moderately agitated.  Speech fluent without evidence of aphasia. Able to follow commands without difficulty. Cranial Nerves: II-Visual fields were normal. III/IV/VI-Pupils were equal and reacted normally to light. Extraocular movements  were full and conjugate.    V/VII-no  facial numbness and no facial weakness. VIII-normal. X-normal speech and symmetrical palatal movement. XI: trapezius strength/neck flexion strength normal bilaterally XII-midline tongue extension with normal strength. Motor: 5/5 bilaterally with normal tone and bulk Sensory: Normal throughout. Deep Tendon Reflexes: 1+ and symmetric. Plantars: Mute bilaterally Cerebellar: Normal finger-to-nose testing. Carotid auscultation: Normal  Results for orders placed or performed during the hospital encounter of 05/12/15 (from the past 48 hour(s))  Glucose, capillary     Status: None   Collection Time: 05/12/15 12:54 AM  Result Value Ref Range   Glucose-Capillary 91 65 - 99 mg/dL   Ct Head Wo Contrast  05/11/2015   CLINICAL DATA:  Confusion being yesterday.  Hallucinations.  EXAM: CT HEAD WITHOUT CONTRAST  TECHNIQUE: Contiguous axial images were obtained from the base of the skull through the vertex without intravenous contrast.  COMPARISON:  07/21/2014  FINDINGS: Mild acute subarachnoid hemorrhage is seen in the left frontal region. No other sites of intracranial hemorrhage identified. No evidence of brain edema, mass effect, or other signs of acute cerebral infarct. Mild to moderate chronic small vessel disease is stable in appearance. No evidence hydrocephalus. No skull abnormality identified.  IMPRESSION: Mild acute subarachnoid hemorrhage in left frontal region. No evidence of mass effect or midline shift.  Stable mild moderate chronic small vessel disease.  Critical Value/emergent results were called by telephone at the time of interpretation on 05/11/2015 at 9:25 pm to Dr. Lavonia Drafts , who verbally acknowledged these results.   Electronically Signed   By: Earle Gell M.D.   On: 05/11/2015 21:28    Assessment: 64 y.o. female with a history of hypertension and TIAs presenting with altered mental status with apparent delirium, which is resolving, as well as  acute left frontal subarachnoid hemorrhage as described above.  Stroke Risk Factors - family history, hypertension and smoking  Plan: 1. HgbA1c, fasting lipid panel 2. MRI, MRA  of the brain without contrast 3. PT consult, OT consult 4. Echocardiogram 5. Carotid dopplers 6. Prophylactic therapy-None 7. Risk factor modification 8. Telemetry monitoring  This patient is critically ill and at significant risk of neurological worsening or death, and care requires constant monitoring of vital signs, hemodynamics,respiratory and cardiac monitoring, neurological assessment, discussion with family, other specialists and medical decision making of high complexity. Total critical care time was 60 minutes.  C.R. Nicole Kindred, MD [facility] [phone]  05/12/2015, 1:44 AM

## 2015-05-12 NOTE — Progress Notes (Signed)
STROKE TEAM PROGRESS NOTE   HISTORY Sydney Coleman is an 64 y.o. female with a history of hypertension, TIAs, anxiety and chronic headaches, who presented to the emergency room at [facility] for acute change in mental status. Exact onset is unclear (LKW 05/12/2015, time unknown). Her daughter thinks that symptoms may have started last night a possible early this morning with confusion and at times visual hallucinations. This evening she was noted to be outside of her house wearing underwear and T-shirt, and was subsequently brought to the emergency room. CT scan of her head showed mild acute subarachnoid hemorrhage involving left frontal region. There was no mass effect. There is no history of recent trauma. Laboratory studies were unremarkable except for urinalysis which showed findings indicative of possible UTI. Patient has been afebrile. Patient was not administered TPA secondary to [facility]. She was admitted for further evaluation and treatment.   SUBJECTIVE (INTERVAL HISTORY) Her sitter is at the bedside.  Overall she feels her condition is rapidly improving. She is much more oriented and pleasant this am. Mood stabilized. Blood pressure has not been elevated.   OBJECTIVE Temp:  [97.6 F (36.4 C)-97.8 F (36.6 C)] 97.6 F (36.4 C) (07/28 0354) Pulse Rate:  [69-99] 70 (07/28 0800) Cardiac Rhythm:  [-] Normal sinus rhythm (07/28 0800) Resp:  [14-20] 16 (07/28 0700) BP: (85-137)/(41-84) 102/49 mmHg (07/28 0800) SpO2:  [93 %-100 %] 97 % (07/28 0800) Weight:  [61.236 kg (135 lb)] 61.236 kg (135 lb) (07/27 2031)   Recent Labs Lab 05/12/15 0054  GLUCAP 91    Recent Labs Lab 05/11/15 2037  NA 138  K 3.7  CL 101  CO2 28  GLUCOSE 103*  BUN 11  CREATININE 1.00  CALCIUM 9.6    Recent Labs Lab 05/11/15 2037  AST 16  ALT 13*  ALKPHOS 219*  BILITOT 0.8  PROT 8.0  ALBUMIN 4.3    Recent Labs Lab 05/11/15 2037  WBC 9.4  HGB 15.7  HCT 45.3  MCV 99.9  PLT 293   No results for  input(s): CKTOTAL, CKMB, CKMBINDEX, TROPONINI in the last 168 hours.  Recent Labs  05/12/15 0136  LABPROT 14.2  INR 1.08    Recent Labs  05/11/15 2037  COLORURINE AMBER*  LABSPEC 1.025  PHURINE 5.0  GLUCOSEU NEGATIVE  HGBUR NEGATIVE  BILIRUBINUR NEGATIVE  KETONESUR TRACE*  PROTEINUR 30*  NITRITE POSITIVE*  LEUKOCYTESUR 3+*    No results found for: CHOL, TRIG, HDL, CHOLHDL, VLDL, LDLCALC No results found for: HGBA1C    Component Value Date/Time   LABOPIA NONE DETECTED 05/11/2015 2037   LABBENZ POSITIVE* 05/11/2015 2037   AMPHETMU NONE DETECTED 05/11/2015 2037   THCU NONE DETECTED 05/11/2015 2037   LABBARB POSITIVE* 05/11/2015 2037     Recent Labs Lab 05/11/15 2037  ETH <5    Ct Head Wo Contrast  05/12/2015   CLINICAL DATA:  Change in mental status. Confusion, hallucination for 2 days. Follow-up subarachnoid hemorrhage. History of transient ischemic attack, hypertension, chronic headaches.  EXAM: CT HEAD WITHOUT CONTRAST  TECHNIQUE: Contiguous axial images were obtained from the base of the skull through the vertex without intravenous contrast.  COMPARISON:  CT head May 11, 2015  FINDINGS: Small amount of residual LEFT frontal subarachnoid hemorrhage. No intraparenchymal hemorrhage, mass effect, midline shift or acute large vascular territory infarct. The ventricles and sulci are otherwise normal for patient's age. Tiny hypodensities in the basal ganglia can be seen with small old lacunar infarcts or, perivascular spaces. Patchy  to confluent supratentorial white matter hypodensities.  No abnormal extra-axial fluid collections. Moderate calcific atherosclerosis of the carotid siphons. Basal cisterns are patent.  Ocular globes and orbital contents are normal. Visualized paranasal sinuses and mastoid air cells are well aerated. No skull fracture.  IMPRESSION: Small amount of residual LEFT frontal subarachnoid hemorrhage.  At least moderate white matter changes compatible with  chronic small vessel ischemic disease.   Electronically Signed   By: Elon Alas M.D.   On: 05/12/2015 06:09   Ct Head Wo Contrast  05/11/2015   CLINICAL DATA:  Confusion being yesterday.  Hallucinations.  EXAM: CT HEAD WITHOUT CONTRAST  TECHNIQUE: Contiguous axial images were obtained from the base of the skull through the vertex without intravenous contrast.  COMPARISON:  07/21/2014  FINDINGS: Mild acute subarachnoid hemorrhage is seen in the left frontal region. No other sites of intracranial hemorrhage identified. No evidence of brain edema, mass effect, or other signs of acute cerebral infarct. Mild to moderate chronic small vessel disease is stable in appearance. No evidence hydrocephalus. No skull abnormality identified.  IMPRESSION: Mild acute subarachnoid hemorrhage in left frontal region. No evidence of mass effect or midline shift.  Stable mild moderate chronic small vessel disease.  Critical Value/emergent results were called by telephone at the time of interpretation on 05/11/2015 at 9:25 pm to Dr. Lavonia Drafts , who verbally acknowledged these results.   Electronically Signed   By: Earle Gell M.D.   On: 05/11/2015 21:28   Chest Port 1 View  05/12/2015   CLINICAL DATA:  Retained adult health maintenance  EXAM: PORTABLE CHEST - 1 VIEW  COMPARISON:  None.  FINDINGS: The heart size is normal. The lungs are clear. Emphysematous changes are again noted. Breast implants are noted. The visualized soft tissues and bony thorax are unremarkable. Atherosclerotic calcifications are noted at the aortic arch.  IMPRESSION: No active disease.  Imaging findings of potential clinical significance:  1. Emphysema 2. Atherosclerosis of the thoracic aorta   Electronically Signed   By: San Morelle M.D.   On: 05/12/2015 07:18     PHYSICAL EXAM Pleasant frail middle-aged Caucasian lady currently not in distress. . Afebrile. Head is nontraumatic. Neck is supple without bruit.    Cardiac exam no murmur  or gallop. Lungs are clear to auscultation. Distal pulses are well felt. Neurological Exam ;  Awake  Alert oriented x 3. Normal speech and language.mildly diminished attention, registration and recall. Follows 3 step commands. eye movements full without nystagmus.fundi were not visualized. Vision acuity and fields appear normal. Hearing is normal. Palatal movements are normal. Face symmetric. Tongue midline. Normal strength, tone, reflexes and coordination. Normal sensation. Gait deferred .ASSESSMENT/PLAN Sydney Coleman is a 64 y.o. female with history of hypertension, TIAs, anxiety and chronic headaches presenting with altered mental status. She did not receive IV t-PA due to [facility] on CT.   Stroke:  Left frontal SAH secondary to unknown etiology, AVM vs cortical venous thrombosis  No visible trauma, no source of injury  Resultant  Mild expressive aphasia  Repeat CT shows resolving SAH  MRI brain ordered  MRA brain ordered  Consider cerebral angio based on MRI results. Will put on schedule for tomorrow in the event it is needed.  Check vasculitis panel   SCDs for VTE prophylaxis  Diet NPO time specified  no antithrombotic scheduled prior to admission. Does admit to taking ~1 Gabriel Earing Powder/day  Continue hydration  Continue ICU level care  Therapy recommendations:  Pending.  Ok to be OOB from stroke standpoint  Disposition:  Anticipate return home  Hypertension  Reported history  Stable in hospital  No Home meds listed  Other Stroke Risk Factors  Advanced age  Cigarette smoker, advised to stop smoking. Refused patch  UDS positive for benzos and barbituates  Hx TIA  Family hx stroke (mother)  Other Active Problems  UTI, UA pos for nitrates, 3+ leukocytes, many bacteria. Culture pending. Has had a history of 8 bladder surgeries. Will hold abx until culture results available given hx.  Other Pertinent History  Chronic headaches. Takes Fiorecet, not  daily.  [facility] day # 0  Radene Journey [facility] [place] for Pager information 05/12/2015 9:57 AM  I have personally examined this patient, reviewed notes, independently viewed imaging studies, participated in medical decision making and plan of care. I have made any additions or clarifications directly to the above note. Agree with note above. She presented with confusion and hallucinations and brain scan shows frontal subarachnoid hemorrhage of unclear etiology. She remains at risk for neurological worsening, average expansion and needs close neurological monitoring and tight blood pressure control. Plan to check MRI scan today and if no obvious etiology found will do  cerebral catheter angiogram tomorrow. No family available at bedside This patient is critically ill and at significant risk of neurological worsening, death and care requires constant monitoring of vital signs, hemodynamics,respiratory and cardiac monitoring, extensive review of multiple databases, frequent neurological assessment, discussion with family, other specialists and medical decision making of high complexity.I have made any additions or clarifications directly to the above note.This critical care time does not reflect procedure time, or teaching time or supervisory time of PA/NP/Med Resident etc but could involve care discussion time.  I spent 30 minutes of neurocritical care time  in the care of  this patient.     Antony Contras, MD Medical Director [facility] Stroke [facility] Pager: [phone] 05/12/2015 2:19 PM    To contact Stroke Continuity provider, please refer to [email or web address] After hours, contact General Neurology

## 2015-05-12 NOTE — Evaluation (Signed)
Physical Therapy Evaluation Patient Details Name: Sydney Coleman MRN: [id number] DOB: 12/05/1950 Today's Date: 05/12/2015   History of Present Illness  Pt is a 64 y/o female with a PMH of HTN, TIA's, anxiety and chronic headaches, who presented to the ED at [facility] for acute change in mental status. CT scan revealed mild acute subsrachnoid hemorrhage involving the left frontal region. Labs revealed possible UTI.   Clinical Impression  Pt admitted with above diagnosis. Pt currently with functional limitations due to the deficits listed below (see PT Problem List). At the time of PT eval, pt was able to perform transfers and ambulation with min assist progressing to a light min guard assist. Balance improved throughout session. Pt will benefit from skilled PT to increase their independence and safety with mobility to allow discharge to the venue listed below.    Note: Evaluation completed 7/28, and order start time 7/29. Discussed with RN Shea Stakes who states that MD wanting pt OOB today, and gave the go-ahead to complete PT eval this morning.      Follow Up Recommendations Home health PT;Supervision - Intermittent (Depending on progress - may need no follow up)    Equipment Recommendations  None recommended by PT    Recommendations for Other Services       Precautions / Restrictions Precautions Precautions: Fall Restrictions Weight Bearing Restrictions: No      Mobility  Bed Mobility Overal bed mobility: Needs Assistance Bed Mobility: Supine to Sit     Supine to sit: Supervision     General bed mobility comments: No physical assistance required.   Transfers Overall transfer level: Needs assistance Equipment used: None Transfers: Sit to/from Stand Sit to Stand: Min guard         General transfer comment: Min guard initially for safety, and pt progressed to supervision level by end of session.   Ambulation/Gait Ambulation/Gait assistance: Min assist;Min guard Ambulation  Distance (Feet): 200 Feet Assistive device: None Gait Pattern/deviations: Step-through pattern;Decreased stride length Gait velocity: Decreased Gait velocity interpretation: Below normal speed for age/gender General Gait Details: Pt initially was very unsteady and required min assist to maintain balance. As pt walked, balance improved and pt at a light min guard level by end of session.   Stairs            Wheelchair Mobility    Modified Rankin (Stroke Patients Only)       Balance Overall balance assessment: Needs assistance Sitting-balance support: Feet supported;No upper extremity supported Sitting balance-Leahy Scale: Good     Standing balance support: No upper extremity supported;During functional activity Standing balance-Leahy Scale: Poor Standing balance comment: Required assist initially                              Pertinent Vitals/Pain Pain Assessment: No/denies pain    Home Living Family/patient expects to be discharged to:: Private residence Living Arrangements: Spouse/significant other Available Help at Discharge: Family;Available PRN/intermittently Type of Home: House Home Access: Level entry     Home Layout: One level Home Equipment: Walker - 2 wheels;Bedside commode Additional Comments: Pt states that her husband does not work however she does not believe that he will stay home with her 24 hours if needed. Husband not present and not able to confirm how much assist he will be able to provide.    Prior Function Level of Independence: Independent  Hand Dominance   Dominant Hand: Right    Extremity/Trunk Assessment   Upper Extremity Assessment: Defer to OT evaluation           Lower Extremity Assessment: Overall WFL for tasks assessed      Cervical / Trunk Assessment: Normal  Communication   Communication: No difficulties  Cognition Arousal/Alertness: Awake/alert Behavior During Therapy: WFL for tasks  assessed/performed Overall Cognitive Status: Within Functional Limits for tasks assessed                      General Comments      Exercises        Assessment/Plan    PT Assessment Patient needs continued PT services  PT Diagnosis Difficulty walking;Generalized weakness   PT Problem List Decreased strength;Decreased range of motion;Decreased activity tolerance;Decreased balance;Decreased mobility;Decreased knowledge of use of DME;Decreased safety awareness;Decreased knowledge of precautions  PT Treatment Interventions DME instruction;Gait training;Stair training;Functional mobility training;Therapeutic activities;Therapeutic exercise;Neuromuscular re-education;Patient/family education   PT Goals (Current goals can be found in the Care Plan section) Acute Rehab PT Goals Patient Stated Goal: [place] PT Goal Formulation: With patient Time For Goal Achievement: 05/19/15 Potential to Achieve Goals: Good    Frequency Min 3X/week   Barriers to discharge        Co-evaluation               End of Session Equipment Utilized During Treatment: Gait belt Activity Tolerance: Patient tolerated treatment well Patient left: in chair;with call bell/phone within reach;with nursing/sitter in room Nurse Communication: Mobility status         Time: 1010-1040 PT Time Calculation (min) (ACUTE ONLY): 30 min   Charges:   PT Evaluation $Initial PT Evaluation Tier I: 1 Procedure PT Treatments $Gait Training: 8-22 mins   PT G Codes:        Rolinda Roan 11-Jun-2015, 11:15 AM   Rolinda Roan, PT, DPT Acute Rehabilitation Services Pager: [phone]

## 2015-05-12 NOTE — Progress Notes (Signed)
Patient threatening to leave AMA.  Explained to patient the importance of her hospital stay and why she is here.  Patients states, "you cant keep me here, I know my rights".  Was able to calm patient down, but she states she is "not going to stay for days".

## 2015-05-12 NOTE — Progress Notes (Signed)
Called Dr. Nicole Kindred and notified him of change in patient's neuro status.  Patient has become increasingly more difficult to arouse, and is no longer alert and oriented to self, time, and situation.  Orders received for a stat head CT.  Patient was transported down to CT, scanned, and transported back to 9H73 with no complications.

## 2015-05-13 ENCOUNTER — Encounter (HOSPITAL_COMMUNITY): Payer: Self-pay | Admitting: Physician Assistant

## 2015-05-13 ENCOUNTER — Inpatient Hospital Stay (HOSPITAL_COMMUNITY): Payer: Medicare Other

## 2015-05-13 DIAGNOSIS — I639 Cerebral infarction, unspecified: Secondary | ICD-10-CM

## 2015-05-13 LAB — COMPLEMENT, TOTAL

## 2015-05-13 LAB — ANTITHROMBIN III: AntiThromb III Func: 112 % (ref 75–120)

## 2015-05-13 LAB — C4 COMPLEMENT: Complement C4, Body Fluid: 24 mg/dL (ref 14–44)

## 2015-05-13 LAB — ANTINUCLEAR ANTIBODIES, IFA: ANA Ab, IFA: NEGATIVE

## 2015-05-13 LAB — C3 COMPLEMENT: C3 Complement: 142 mg/dL (ref 82–167)

## 2015-05-13 MED ORDER — FENTANYL CITRATE (PF) 100 MCG/2ML IJ SOLN
INTRAMUSCULAR | Status: AC
  Filled 2015-05-13: qty 2

## 2015-05-13 MED ORDER — SODIUM CHLORIDE 0.9 % IV SOLN: INTRAVENOUS | Status: AC

## 2015-05-13 MED ORDER — MIDAZOLAM HCL 2 MG/2ML IJ SOLN
INTRAMUSCULAR | Status: AC | PRN
  Administered 2015-05-13: 1 mg via INTRAVENOUS

## 2015-05-13 MED ORDER — LIDOCAINE HCL 1 % IJ SOLN
INTRAMUSCULAR | Status: AC
  Filled 2015-05-13: qty 20

## 2015-05-13 MED ORDER — LORAZEPAM 2 MG/ML IJ SOLN
0.5000 mg | Freq: Once | INTRAMUSCULAR | Status: AC
  Administered 2015-05-13: 0.5 mg via INTRAVENOUS

## 2015-05-13 MED ORDER — HEPARIN SODIUM (PORCINE) 1000 UNIT/ML IJ SOLN
INTRAMUSCULAR | Status: AC
  Administered 2015-05-13: 15:00:00
  Filled 2015-05-13: qty 1

## 2015-05-13 MED ORDER — HEPARIN SODIUM (PORCINE) 1000 UNIT/ML IJ SOLN: 500.0000 [IU] | Freq: Once | INTRAMUSCULAR | Status: DC

## 2015-05-13 MED ORDER — IOHEXOL 300 MG/ML  SOLN
150.0000 mL | Freq: Once | INTRAMUSCULAR | Status: AC | PRN
Start: 2015-05-13 — End: 2015-05-13
  Administered 2015-05-13: 70 mL via INTRAVENOUS

## 2015-05-13 MED ORDER — MIDAZOLAM HCL 2 MG/2ML IJ SOLN
INTRAMUSCULAR | Status: AC
  Filled 2015-05-13: qty 2

## 2015-05-13 MED ORDER — FENTANYL CITRATE (PF) 100 MCG/2ML IJ SOLN
INTRAMUSCULAR | Status: AC | PRN
  Administered 2015-05-13: 25 ug via INTRAVENOUS

## 2015-05-13 NOTE — Sedation Documentation (Signed)
Patient is resting comfortably. 

## 2015-05-13 NOTE — Progress Notes (Signed)
STROKE TEAM PROGRESS NOTE   HISTORY Sydney Coleman is an 64 y.o. female with a history of hypertension, TIAs, anxiety and chronic headaches, who presented to the emergency room at [facility] for acute change in mental status. Exact onset is unclear (LKW 05/12/2015, time unknown). Her daughter thinks that symptoms may have started last night a possible early this morning with confusion and at times visual hallucinations. This evening she was noted to be outside of her house wearing underwear and T-shirt, and was subsequently brought to the emergency room. CT scan of her head showed mild acute subarachnoid hemorrhage involving left frontal region. There was no mass effect. There is no history of recent trauma. Laboratory studies were unremarkable except for urinalysis which showed findings indicative of possible UTI. Patient has been afebrile. Patient was not administered TPA secondary to [facility]. She was admitted for further evaluation and treatment.   SUBJECTIVE (INTERVAL HISTORY) Her husband and sitter is at the bedside.  Overall she feels her condition is rapidly improving. She is much more anxious this am and wants to go home. Mood stabilized. Blood pressure has not been elevated. MRI scan with contrast shows enhancing left frontal convexity dural vessels raising concern for dural AV fistula versus small AVM there are also 2 tiny lacunar infarcts in the right occipital and left parietal white matter   OBJECTIVE Temp:  [97.3 F (36.3 C)-98.1 F (36.7 C)] 97.3 F (36.3 C) (07/29 1226) Pulse Rate:  [59-83] 59 (07/29 1200) Cardiac Rhythm:  [-] Normal sinus rhythm (07/29 1200) Resp:  [13-20] 16 (07/29 1200) BP: (92-166)/(49-149) 123/80 mmHg (07/29 1200) SpO2:  [91 %-100 %] 100 % (07/29 1200) Weight:  [135 lb 12.9 oz (61.6 kg)] 135 lb 12.9 oz (61.6 kg) (07/28 2000)   Recent Labs Lab 05/12/15 0054  GLUCAP 91    Recent Labs Lab 05/11/15 2037  NA 138  K 3.7  CL 101  CO2 28  GLUCOSE 103*  BUN 11   CREATININE 1.00  CALCIUM 9.6    Recent Labs Lab 05/11/15 2037  AST 16  ALT 13*  ALKPHOS 219*  BILITOT 0.8  PROT 8.0  ALBUMIN 4.3    Recent Labs Lab 05/11/15 2037  WBC 9.4  HGB 15.7  HCT 45.3  MCV 99.9  PLT 293   No results for input(s): CKTOTAL, CKMB, CKMBINDEX, TROPONINI in the last 168 hours.  Recent Labs  05/12/15 0136  LABPROT 14.2  INR 1.08    Recent Labs  05/11/15 2037  COLORURINE AMBER*  LABSPEC 1.025  PHURINE 5.0  GLUCOSEU NEGATIVE  HGBUR NEGATIVE  BILIRUBINUR NEGATIVE  KETONESUR TRACE*  PROTEINUR 30*  NITRITE POSITIVE*  LEUKOCYTESUR 3+*    No results found for: CHOL, TRIG, HDL, CHOLHDL, VLDL, LDLCALC No results found for: HGBA1C    Component Value Date/Time   LABOPIA NONE DETECTED 05/11/2015 2037   LABBENZ POSITIVE* 05/11/2015 2037   AMPHETMU NONE DETECTED 05/11/2015 2037   THCU NONE DETECTED 05/11/2015 2037   LABBARB POSITIVE* 05/11/2015 2037     Recent Labs Lab 05/11/15 2037  ETH <5    Ct Head Wo Contrast  05/12/2015   CLINICAL DATA:  Change in mental status. Confusion, hallucination for 2 days. Follow-up subarachnoid hemorrhage. History of transient ischemic attack, hypertension, chronic headaches.  EXAM: CT HEAD WITHOUT CONTRAST  TECHNIQUE: Contiguous axial images were obtained from the base of the skull through the vertex without intravenous contrast.  COMPARISON:  CT head May 11, 2015  FINDINGS: Small amount of residual  LEFT frontal subarachnoid hemorrhage. No intraparenchymal hemorrhage, mass effect, midline shift or acute large vascular territory infarct. The ventricles and sulci are otherwise normal for patient's age. Tiny hypodensities in the basal ganglia can be seen with small old lacunar infarcts or, perivascular spaces. Patchy to confluent supratentorial white matter hypodensities.  No abnormal extra-axial fluid collections. Moderate calcific atherosclerosis of the carotid siphons. Basal cisterns are patent.  Ocular globes and  orbital contents are normal. Visualized paranasal sinuses and mastoid air cells are well aerated. No skull fracture.  IMPRESSION: Small amount of residual LEFT frontal subarachnoid hemorrhage.  At least moderate white matter changes compatible with chronic small vessel ischemic disease.   Electronically Signed   By: Elon Alas M.D.   On: 05/12/2015 06:09   Ct Head Wo Contrast  05/11/2015   CLINICAL DATA:  Confusion being yesterday.  Hallucinations.  EXAM: CT HEAD WITHOUT CONTRAST  TECHNIQUE: Contiguous axial images were obtained from the base of the skull through the vertex without intravenous contrast.  COMPARISON:  07/21/2014  FINDINGS: Mild acute subarachnoid hemorrhage is seen in the left frontal region. No other sites of intracranial hemorrhage identified. No evidence of brain edema, mass effect, or other signs of acute cerebral infarct. Mild to moderate chronic small vessel disease is stable in appearance. No evidence hydrocephalus. No skull abnormality identified.  IMPRESSION: Mild acute subarachnoid hemorrhage in left frontal region. No evidence of mass effect or midline shift.  Stable mild moderate chronic small vessel disease.  Critical Value/emergent results were called by telephone at the time of interpretation on 05/11/2015 at 9:25 pm to Dr. Lavonia Drafts , who verbally acknowledged these results.   Electronically Signed   By: Earle Gell M.D.   On: 05/11/2015 21:28   Mr Jodene Nam Head Wo Contrast  05/12/2015   CLINICAL DATA:  Subarachnoid hemorrhage on head CT. Confusion and hallucinations.  EXAM: MRI HEAD WITHOUT AND WITH CONTRAST  MRA HEAD WITHOUT CONTRAST  TECHNIQUE: Multiplanar, multiecho pulse sequences of the brain and surrounding structures were obtained without and with intravenous contrast. Angiographic images of the head were obtained using MRA technique without contrast.  CONTRAST:  13 mL MultiHance  COMPARISON:  Head CT 05/12/2015, MRA 07/11/2009, and MRI 12/07/2008  FINDINGS: MRI  HEAD FINDINGS  There is an 8 mm acute subcortical infarct in the left parietal lobe. There is also a punctate 3 mm acute cortical infarct in the inferomedial right occipital lobe. Cerebral volume is within normal limits for age. Patchy T2 hyperintensities throughout the subcortical and deep cerebral white matter bilaterally are nonspecific but consistent with age advanced chronic small vessel ischemia, progressed from the prior MRI.  Sulcal FLAIR hyperintensity and susceptibility artifact is present in the anterior left frontal lobe corresponding to the subarachnoid hemorrhage seen on CT. There is leptomeningeal enhancement and numerous small vessels along the surface of the left frontal lobe in this region.  There is also sulcal susceptibility artifact in the superior and anterior right frontal lobe as well as in the left parietal and occipital lobes without definite sulcal FLAIR hyperintensity in these regions, possibly reflecting hemosiderin staining related to remote subarachnoid hemorrhage.  Orbits are unremarkable. Paranasal sinuses are clear. There are trace bilateral mastoid effusions. Major intracranial vascular flow voids are preserved.  MRA HEAD FINDINGS  The visualized distal vertebral arteries are patent and codominant without stenosis. PICA and SCA origins are patent. Basilar artery is patent without stenosis. There are posterior communicating arteries bilaterally. There is mild PCA branch vessel irregularity  bilaterally without evidence of significant proximal stenosis.  Internal carotid arteries are patent from skullbase to carotid termini without evidence of significant stenosis. A 2 mm aneurysm is question projecting medially from the proximal left supraclinoid ICA in the superior hypophyseal region, grossly unchanged from the prior MRA. ACAs and MCAs are patent with mild branch vessel irregularity but no significant proximal stenosis.  IMPRESSION: 1. Acute left frontal subarachnoid hemorrhage.  Leptomeningeal enhancement and mildly prominent cortical vessels are present in this region, and a small pial AVM or AV fistula is not excluded. Consider catheter angiography for further evaluation. 2. Evidence of likely chronic subarachnoid hemorrhage in the right frontal and left parieto-occipital regions. 3. Subcentimeter acute infarcts in the left parietal and right occipital lobes. 4. Extensive chronic small vessel ischemia in the cerebral white matter, progressed from prior MRI. 5. No major intracranial arterial occlusion or significant proximal stenosis. 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   Electronically Signed   By: Logan Bores   On: 05/12/2015 20:41   Mr Brain W Wo Contrast  05/12/2015   CLINICAL DATA:  Subarachnoid hemorrhage on head CT. Confusion and hallucinations.  EXAM: MRI HEAD WITHOUT AND WITH CONTRAST  MRA HEAD WITHOUT CONTRAST  TECHNIQUE: Multiplanar, multiecho pulse sequences of the brain and surrounding structures were obtained without and with intravenous contrast. Angiographic images of the head were obtained using MRA technique without contrast.  CONTRAST:  13 mL MultiHance  COMPARISON:  Head CT 05/12/2015, MRA 07/11/2009, and MRI 12/07/2008  FINDINGS: MRI HEAD FINDINGS  There is an 8 mm acute subcortical infarct in the left parietal lobe. There is also a punctate 3 mm acute cortical infarct in the inferomedial right occipital lobe. Cerebral volume is within normal limits for age. Patchy T2 hyperintensities throughout the subcortical and deep cerebral white matter bilaterally are nonspecific but consistent with age advanced chronic small vessel ischemia, progressed from the prior MRI.  Sulcal FLAIR hyperintensity and susceptibility artifact is present in the anterior left frontal lobe corresponding to the subarachnoid hemorrhage seen on CT. There is leptomeningeal enhancement and numerous small vessels along the surface of the left frontal lobe in this  region.  There is also sulcal susceptibility artifact in the superior and anterior right frontal lobe as well as in the left parietal and occipital lobes without definite sulcal FLAIR hyperintensity in these regions, possibly reflecting hemosiderin staining related to remote subarachnoid hemorrhage.  Orbits are unremarkable. Paranasal sinuses are clear. There are trace bilateral mastoid effusions. Major intracranial vascular flow voids are preserved.  MRA HEAD FINDINGS  The visualized distal vertebral arteries are patent and codominant without stenosis. PICA and SCA origins are patent. Basilar artery is patent without stenosis. There are posterior communicating arteries bilaterally. There is mild PCA branch vessel irregularity bilaterally without evidence of significant proximal stenosis.  Internal carotid arteries are patent from skullbase to carotid termini without evidence of significant stenosis. A 2 mm aneurysm is question projecting medially from the proximal left supraclinoid ICA in the superior hypophyseal region, grossly unchanged from the prior MRA. ACAs and MCAs are patent with mild branch vessel irregularity but no significant proximal stenosis.  IMPRESSION: 1. Acute left frontal subarachnoid hemorrhage. Leptomeningeal enhancement and mildly prominent cortical vessels are present in this region, and a small pial AVM or AV fistula is not excluded. Consider catheter angiography for further evaluation. 2. Evidence of likely chronic subarachnoid hemorrhage in the right frontal and left parieto-occipital regions. 3. Subcentimeter acute infarcts in the left  parietal and right occipital lobes. 4. Extensive chronic small vessel ischemia in the cerebral white matter, progressed from prior MRI. 5. No major intracranial arterial occlusion or significant proximal stenosis. 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   Electronically Signed   By: Logan Bores   On: 05/12/2015 20:41   Chest  Port 1 View  05/12/2015   CLINICAL DATA:  Retained adult health maintenance  EXAM: PORTABLE CHEST - 1 VIEW  COMPARISON:  None.  FINDINGS: The heart size is normal. The lungs are clear. Emphysematous changes are again noted. Breast implants are noted. The visualized soft tissues and bony thorax are unremarkable. Atherosclerotic calcifications are noted at the aortic arch.  IMPRESSION: No active disease.  Imaging findings of potential clinical significance:  1. Emphysema 2. Atherosclerosis of the thoracic aorta   Electronically Signed   By: San Morelle M.D.   On: 05/12/2015 07:18     PHYSICAL EXAM Pleasant frail middle-aged Caucasian lady currently not in distress. . Afebrile. Head is nontraumatic. Neck is supple without bruit.    Cardiac exam no murmur or gallop. Lungs are clear to auscultation. Distal pulses are well felt. Neurological Exam ;  Awake  Alert oriented x 3. Normal speech and language.mildly diminished attention, registration and recall. Follows 3 step commands. eye movements full without nystagmus.fundi were not visualized. Vision acuity and fields appear normal. Hearing is normal. Palatal movements are normal. Face symmetric. Tongue midline. Normal strength, tone, reflexes and coordination. Normal sensation. Gait deferred .ASSESSMENT/PLAN Sydney Coleman is a 64 y.o. female with history of hypertension, TIAs, anxiety and chronic headaches presenting with altered mental status. She did not receive IV t-PA due to [facility] on CT.   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 Incidental silent small right occipital and pareital tiny white matter infarcts  No visible trauma, no source of injury  Resultant  Mild expressive aphasia  Repeat CT shows resolving SAH  MRI brain ordered  MRA brain ordered  Consider cerebral angio based on MRI results. Will put on schedule for tomorrow in the event it is needed.  Check vasculitis panel    SCDs for VTE prophylaxis Diet NPO time specified Except for: Sips with Meds  no antithrombotic scheduled prior to admission. Does admit to taking ~1 Gabriel Earing Powder/day  Continue hydration  Continue ICU level care  Therapy recommendations:  Pending. Ok to be OOB from stroke standpoint  Disposition:  Anticipate return home  Hypertension  Reported history  Stable in hospital  No Home meds listed  Other Stroke Risk Factors  Advanced age  Cigarette smoker, advised to stop smoking. Refused patch  UDS positive for benzos and barbituates  Hx TIA  Family hx stroke (mother)  Other Active Problems  UTI, UA pos for nitrates, 3+ leukocytes, many bacteria. Culture pending. Has had a history of 8 bladder surgeries. Will hold abx until culture results available given hx.  Other Pertinent History  Chronic headaches. Takes Fiorecet, not daily.  [facility] day # [place] for Pager information 05/13/2015 1:02 PM  I have personally examined this patient, reviewed notes, independently viewed imaging studies, participated in medical decision making and plan of care. I have made any additions or clarifications directly to the above note. Agree with note above. She presented with confusion and hallucinations and brain scan shows frontal subarachnoid hemorrhage   MRI scan shows dilated vessels  over the left frontal convexity with dural enhancement raising possibility of dural AV fistula versus a small AV malformation. Patient appears reluctant to undergo cerebral catheter angiogram but after extensive discussion with her and her husband I have convinced her to undergo a diagnostic angiogram to see if the vascular abnormality is amenable to endovascular treatment    Antony Contras, MD Medical Director [place] Pager: [phone] 05/13/2015 1:02 PM    To contact Stroke Continuity provider, please refer to  [email or web address] After hours, contact General Neurology

## 2015-05-13 NOTE — Progress Notes (Signed)
Pt discharged home with husband. CSW came up from ED to complete IVC paperwork with MD.  Dr Nicole Kindred came to bedside to assess and discharge pt. He put discharge orders in computer. I was unable to get AVS to print for patient's family to sign- even after working with MD, hospital [facility], and charge RN. Ended up writing follow up instructions on piece of paper with medications per MD and hospital [facility].  Had patient's family sign this instead of AVS since I could not get this to print, Patient and family verbalized understanding of discharge.  IV discontinued, angiocath intact. Pt ambulated outside into personal vehicle.  No questions or concerns about discharge.

## 2015-05-13 NOTE — Progress Notes (Signed)
  Patient currently refusing to sign consent for cerebral angiography.  Please call again when patient is willing to proceed.  Gareth Eagle, PA-C Interventional Radiology

## 2015-05-13 NOTE — Progress Notes (Addendum)
Patient is requesting to leave tonight by 8pm. Dr. Leonie Man spoke with patient this morning updating her on the scheduled cerebral angiogram. Dr. Leonie Man also informed pt if there was no concerning results from this procedure he would discharge her home this evening.  Per patient request I paged neuro on call and spoke with Dr. Armida Sans, he suggested pt stay until 8 pm, he will then come and evaluate pt with the plans to discharge her home.   Babs Bertin RN

## 2015-05-13 NOTE — Sedation Documentation (Signed)
Vital signs stable. 

## 2015-05-13 NOTE — Procedures (Signed)
S/P bilateral common carotid arteriograms,bilat vert arteriograms and  Selective Lt external carotid arteriograms. Rt CFA approach. Findings. 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 3.No AV shunting to suggest AVM or DAVF  4.NNo dissections noted 5Hypoplastic Lt transverse sinus

## 2015-05-13 NOTE — Progress Notes (Signed)
OT Cancellation Note  Patient Details Name: Sydney Coleman MRN: [id number] DOB: 1950-12-19   Cancelled Treatment:    Reason Eval/Treat Not Completed: Patient not medically ready Pt currently on bedrest. Please update activity orders to initiate therapy. thanks [place], OTR/L  [phone] 05/13/2015 05/13/2015, 7:00 AM

## 2015-05-13 NOTE — Progress Notes (Signed)
Jody CSW from ED is coming to look at pt's IVC papers and prepare paperwork for pt's discharge.   Dr. Nicole Kindred will come to see pt for assessment,  get her ready for discharge, and sign any papers needed to resend IVC.  Babs Bertin RN

## 2015-05-13 NOTE — Progress Notes (Signed)
I contacted America Brown from ED about IVC papers. She will resend IVC papers when pt is discharged home. Call her cell at [phone] when time to discharge.  Babs Bertin RN

## 2015-05-13 NOTE — H&P (Signed)
Chief Complaint: Patient was seen in consultation today for cerebral angiography at the request of Antony Contras  Referring Physician(s): Antony Contras  History of Present Illness: Sydney Coleman is a 64 y.o. female with a history of hypertension, TIAs, anxiety and chronic headaches, who presented to the emergency room at [facility] for acute change in mental status.   On the evening of 05/11/2015, she was found outside her house wearing underwear and T-shirt, and was subsequently brought to the emergency room.  Exact onset is unclear.   CT scan of her head showed mild acute subarachnoid hemorrhage involving left frontal region. There was no mass effect.   There is no history of recent trauma.  Laboratory studies were unremarkable except for urinalysis which showed findings indicative of possible UTI. Patient has been afebrile.  Ms Cirilo is somewhat uncooperative and really doesn't like answering my questions.  She keeps asking if she can leave after this procedure.  Her husband is at her bedside and assists with my questioning.   Past Medical History  Diagnosis Date  . TIA (transient ischemic attack)   . Hypertension   . Chronic headaches   . Anxiety   . Difficult intubation   . SAH (subarachnoid hemorrhage) 05/11/2015  . Cancer of vulva     Past Surgical History  Procedure Laterality Date  . Thyroidectomy, partial    . Placement of breast implants    . Abdominal hysterectomy    . Tonsillectomy      Allergies: Amoxil and Other  Medications: Prior to Admission medications   Medication Sig Start Date End Date Taking? Authorizing Provider  clonazePAM (KLONOPIN) 0.5 MG tablet Take 0.5 mg by mouth daily.   Yes Historical Provider, MD  losartan (COZAAR) 25 MG tablet Take 25 mg by mouth daily.   Yes Historical Provider, MD  pravastatin (PRAVACHOL) 10 MG tablet Take 10 mg by mouth daily.   Yes Historical Provider, MD  butalbital-acetaminophen-caffeine (FIORICET, ESGIC)  50-325-40 MG per tablet Take 1 tablet by mouth 4 (four) times daily as needed for migraine.  05/09/15   Historical Provider, MD     Family History  Problem Relation Age of Onset  . Stroke Mother     History   Social History  . Marital Status: Married    Spouse Name: N/A  . Number of Children: N/A  . Years of Education: N/A   Social History Main Topics  . Smoking status: Current Every Day Smoker    Types: Cigarettes  . Smokeless tobacco: Never Used     Comment: Refuses to tell how much she smokes   . Alcohol Use: No  . Drug Use: Not on file  . Sexual Activity: Not on file   Other Topics Concern  . None   Social History Narrative    Review of Systems  Constitutional: Negative for fever, chills, activity change and appetite change.  Respiratory: Negative for cough, chest tightness and shortness of breath.   Cardiovascular: Negative for chest pain.  Gastrointestinal: Negative for nausea, vomiting, abdominal pain and abdominal distention.  Musculoskeletal: Negative.   Skin: Negative.   Neurological: Positive for headaches. Negative for dizziness, seizures, syncope, speech difficulty, light-headedness and numbness.  Psychiatric/Behavioral: Positive for agitation. Negative for hallucinations and confusion. The patient is nervous/anxious.     Vital Signs: BP 123/80 mmHg  Pulse 59  Temp(Src) 97.3 F (36.3 C) (Oral)  Resp 16  Ht 5\' 3"  (1.6 m)  Wt 135 lb 12.9 oz (61.6  kg)  BMI 24.06 kg/m2  SpO2 100%  Physical Exam  Constitutional: She is oriented to person, place, and time. She appears well-developed and well-nourished.  HENT:  Head: Normocephalic and atraumatic.  Eyes: EOM are normal.  Neck: Normal range of motion. Neck supple.  Cardiovascular: Normal rate, regular rhythm and normal heart sounds.   Pulmonary/Chest: Effort normal and breath sounds normal. No respiratory distress.  Abdominal: Soft. Bowel sounds are normal. She exhibits no distension. There is no  tenderness.  Musculoskeletal: Normal range of motion.  Neurological: She is alert and oriented to person, place, and time.  Skin: Skin is warm and dry.  Psychiatric:  Patient is unpleasant, somewhat uncooperative, agitated and anxious.  Keeps asking to go home, but is now willing to go forward with procedure.  Vitals reviewed.     Mallampati Score:  MD Evaluation Airway: WNL Heart: WNL Abdomen: WNL Chest/ Lungs: WNL ASA  Classification: 3 Mallampati/Airway Score: Two  Imaging: Ct Head Wo Contrast  05/12/2015   CLINICAL DATA:  Change in mental status. Confusion, hallucination for 2 days. Follow-up subarachnoid hemorrhage. History of transient ischemic attack, hypertension, chronic headaches.  EXAM: CT HEAD WITHOUT CONTRAST  TECHNIQUE: Contiguous axial images were obtained from the base of the skull through the vertex without intravenous contrast.  COMPARISON:  CT head May 11, 2015  FINDINGS: Small amount of residual LEFT frontal subarachnoid hemorrhage. No intraparenchymal hemorrhage, mass effect, midline shift or acute large vascular territory infarct. The ventricles and sulci are otherwise normal for patient's age. Tiny hypodensities in the basal ganglia can be seen with small old lacunar infarcts or, perivascular spaces. Patchy to confluent supratentorial white matter hypodensities.  No abnormal extra-axial fluid collections. Moderate calcific atherosclerosis of the carotid siphons. Basal cisterns are patent.  Ocular globes and orbital contents are normal. Visualized paranasal sinuses and mastoid air cells are well aerated. No skull fracture.  IMPRESSION: Small amount of residual LEFT frontal subarachnoid hemorrhage.  At least moderate white matter changes compatible with chronic small vessel ischemic disease.   Electronically Signed   By: Elon Alas M.D.   On: 05/12/2015 06:09   Ct Head Wo Contrast  05/11/2015   CLINICAL DATA:  Confusion being yesterday.  Hallucinations.  EXAM: CT  HEAD WITHOUT CONTRAST  TECHNIQUE: Contiguous axial images were obtained from the base of the skull through the vertex without intravenous contrast.  COMPARISON:  07/21/2014  FINDINGS: Mild acute subarachnoid hemorrhage is seen in the left frontal region. No other sites of intracranial hemorrhage identified. No evidence of brain edema, mass effect, or other signs of acute cerebral infarct. Mild to moderate chronic small vessel disease is stable in appearance. No evidence hydrocephalus. No skull abnormality identified.  IMPRESSION: Mild acute subarachnoid hemorrhage in left frontal region. No evidence of mass effect or midline shift.  Stable mild moderate chronic small vessel disease.  Critical Value/emergent results were called by telephone at the time of interpretation on 05/11/2015 at 9:25 pm to Dr. Lavonia Drafts , who verbally acknowledged these results.   Electronically Signed   By: Earle Gell M.D.   On: 05/11/2015 21:28   Mr Jodene Nam Head Wo Contrast  05/12/2015   CLINICAL DATA:  Subarachnoid hemorrhage on head CT. Confusion and hallucinations.  EXAM: MRI HEAD WITHOUT AND WITH CONTRAST  MRA HEAD WITHOUT CONTRAST  TECHNIQUE: Multiplanar, multiecho pulse sequences of the brain and surrounding structures were obtained without and with intravenous contrast. Angiographic images of the head were obtained using MRA technique without contrast.  CONTRAST:  13 mL MultiHance  COMPARISON:  Head CT 05/12/2015, MRA 07/11/2009, and MRI 12/07/2008  FINDINGS: MRI HEAD FINDINGS  There is an 8 mm acute subcortical infarct in the left parietal lobe. There is also a punctate 3 mm acute cortical infarct in the inferomedial right occipital lobe. Cerebral volume is within normal limits for age. Patchy T2 hyperintensities throughout the subcortical and deep cerebral white matter bilaterally are nonspecific but consistent with age advanced chronic small vessel ischemia, progressed from the prior MRI.  Sulcal FLAIR hyperintensity and  susceptibility artifact is present in the anterior left frontal lobe corresponding to the subarachnoid hemorrhage seen on CT. There is leptomeningeal enhancement and numerous small vessels along the surface of the left frontal lobe in this region.  There is also sulcal susceptibility artifact in the superior and anterior right frontal lobe as well as in the left parietal and occipital lobes without definite sulcal FLAIR hyperintensity in these regions, possibly reflecting hemosiderin staining related to remote subarachnoid hemorrhage.  Orbits are unremarkable. Paranasal sinuses are clear. There are trace bilateral mastoid effusions. Major intracranial vascular flow voids are preserved.  MRA HEAD FINDINGS  The visualized distal vertebral arteries are patent and codominant without stenosis. PICA and SCA origins are patent. Basilar artery is patent without stenosis. There are posterior communicating arteries bilaterally. There is mild PCA branch vessel irregularity bilaterally without evidence of significant proximal stenosis.  Internal carotid arteries are patent from skullbase to carotid termini without evidence of significant stenosis. A 2 mm aneurysm is question projecting medially from the proximal left supraclinoid ICA in the superior hypophyseal region, grossly unchanged from the prior MRA. ACAs and MCAs are patent with mild branch vessel irregularity but no significant proximal stenosis.  IMPRESSION: 1. Acute left frontal subarachnoid hemorrhage. Leptomeningeal enhancement and mildly prominent cortical vessels are present in this region, and a small pial AVM or AV fistula is not excluded. Consider catheter angiography for further evaluation. 2. Evidence of likely chronic subarachnoid hemorrhage in the right frontal and left parieto-occipital regions. 3. Subcentimeter acute infarcts in the left parietal and right occipital lobes. 4. Extensive chronic small vessel ischemia in the cerebral white matter, progressed  from prior MRI. 5. No major intracranial arterial occlusion or significant proximal stenosis. 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   Electronically Signed   By: Logan Bores   On: 05/12/2015 20:41   Mr Brain W Wo Contrast  05/12/2015   CLINICAL DATA:  Subarachnoid hemorrhage on head CT. Confusion and hallucinations.  EXAM: MRI HEAD WITHOUT AND WITH CONTRAST  MRA HEAD WITHOUT CONTRAST  TECHNIQUE: Multiplanar, multiecho pulse sequences of the brain and surrounding structures were obtained without and with intravenous contrast. Angiographic images of the head were obtained using MRA technique without contrast.  CONTRAST:  13 mL MultiHance  COMPARISON:  Head CT 05/12/2015, MRA 07/11/2009, and MRI 12/07/2008  FINDINGS: MRI HEAD FINDINGS  There is an 8 mm acute subcortical infarct in the left parietal lobe. There is also a punctate 3 mm acute cortical infarct in the inferomedial right occipital lobe. Cerebral volume is within normal limits for age. Patchy T2 hyperintensities throughout the subcortical and deep cerebral white matter bilaterally are nonspecific but consistent with age advanced chronic small vessel ischemia, progressed from the prior MRI.  Sulcal FLAIR hyperintensity and susceptibility artifact is present in the anterior left frontal lobe corresponding to the subarachnoid hemorrhage seen on CT. There is leptomeningeal enhancement and numerous small vessels along the surface of  the left frontal lobe in this region.  There is also sulcal susceptibility artifact in the superior and anterior right frontal lobe as well as in the left parietal and occipital lobes without definite sulcal FLAIR hyperintensity in these regions, possibly reflecting hemosiderin staining related to remote subarachnoid hemorrhage.  Orbits are unremarkable. Paranasal sinuses are clear. There are trace bilateral mastoid effusions. Major intracranial vascular flow voids are preserved.  MRA HEAD FINDINGS   The visualized distal vertebral arteries are patent and codominant without stenosis. PICA and SCA origins are patent. Basilar artery is patent without stenosis. There are posterior communicating arteries bilaterally. There is mild PCA branch vessel irregularity bilaterally without evidence of significant proximal stenosis.  Internal carotid arteries are patent from skullbase to carotid termini without evidence of significant stenosis. A 2 mm aneurysm is question projecting medially from the proximal left supraclinoid ICA in the superior hypophyseal region, grossly unchanged from the prior MRA. ACAs and MCAs are patent with mild branch vessel irregularity but no significant proximal stenosis.  IMPRESSION: 1. Acute left frontal subarachnoid hemorrhage. Leptomeningeal enhancement and mildly prominent cortical vessels are present in this region, and a small pial AVM or AV fistula is not excluded. Consider catheter angiography for further evaluation. 2. Evidence of likely chronic subarachnoid hemorrhage in the right frontal and left parieto-occipital regions. 3. Subcentimeter acute infarcts in the left parietal and right occipital lobes. 4. Extensive chronic small vessel ischemia in the cerebral white matter, progressed from prior MRI. 5. No major intracranial arterial occlusion or significant proximal stenosis. 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   Electronically Signed   By: Logan Bores   On: 05/12/2015 20:41   Chest Port 1 View  05/12/2015   CLINICAL DATA:  Retained adult health maintenance  EXAM: PORTABLE CHEST - 1 VIEW  COMPARISON:  None.  FINDINGS: The heart size is normal. The lungs are clear. Emphysematous changes are again noted. Breast implants are noted. The visualized soft tissues and bony thorax are unremarkable. Atherosclerotic calcifications are noted at the aortic arch.  IMPRESSION: No active disease.  Imaging findings of potential clinical significance:  1. Emphysema  2. Atherosclerosis of the thoracic aorta   Electronically Signed   By: San Morelle M.D.   On: 05/12/2015 07:18    Labs:  CBC:  Recent Labs  07/10/14 1043 07/11/14 0313 07/12/14 0317 07/13/14 0452 05/11/15 2037  WBC 7.5 5.0 7.7  --  9.4  HGB 13.4 12.8 12.3 11.9* 15.7  HCT 40.3 37.5 36.2  --  45.3  PLT 262 246 252  --  293    COAGS:  Recent Labs  07/10/14 1043 05/12/15 0136  INR 0.9 1.08  APTT 28.4 20*    BMP:  Recent Labs  07/10/14 1043 07/11/14 0313 07/12/14 0317 05/11/15 2037  NA 138 143 139 138  K 3.7 3.7 4.1 3.7  CL 106 114* 108* 101  CO2 25 23 24 28   GLUCOSE 90 76 137* 103*  BUN 17 12 6* 11  CALCIUM 9.0 7.8* 8.1* 9.6  CREATININE 1.03 0.60 0.53* 1.00  GFRNONAA  --   --   --  58*  GFRAA  --   --   --  >60    LIVER FUNCTION TESTS:  Recent Labs  05/11/15 2037  BILITOT 0.8  AST 16  ALT 13*  ALKPHOS 219*  PROT 8.0  ALBUMIN 4.3    TUMOR MARKERS: No results for input(s): AFPTM, CEA, CA199, CHROMGRNA in the last 8760  hours.    Assessment and Plan:  Mental status changes secondary to subarachnoid hemorrhage  Patient is alert and oriented now and willing to proceed with cerebral angiography today by Dr. Estanislado Pandy  Risks and Benefits discussed with the patient including, but not limited to bleeding, infection, vascular injury, contrast induced renal failure, stroke or even death. All of the patient's questions were answered, patient is agreeable to proceed. Consent signed and in chart.   Thank you for this interesting consult.  I greatly enjoyed meeting DORAINE SCHEXNIDER and look forward to participating in their care.  A copy of this report was sent to the requesting provider on this date.  Signed: Murrell Redden PA-C 05/13/2015, 12:43 PM   I spent a total of 20 Minutes  in face to face in clinical consultation, greater than 50% of which was counseling/coordinating care for cerebral angiography.

## 2015-05-14 LAB — URINE CULTURE: Special Requests: NORMAL

## 2015-05-14 LAB — PROTEIN S, TOTAL: Protein S Ag, Total: 144 % (ref 58–150)

## 2015-05-14 LAB — LUPUS ANTICOAGULANT PANEL
DRVVT: 41.9 s (ref 0.0–55.1)
PTT Lupus Anticoagulant: 36 s (ref 0.0–50.0)

## 2015-05-14 LAB — PROTEIN C ACTIVITY: Protein C Activity: 174 % — ABNORMAL HIGH (ref 74–151)

## 2015-05-14 LAB — PROTEIN S ACTIVITY: Protein S Activity: 142 % (ref 60–145)

## 2015-05-15 LAB — CARDIOLIPIN ANTIBODIES, IGG, IGM, IGA: Anticardiolipin IgG: <9 GPL U/mL (ref 0–14)

## 2015-05-15 LAB — HOMOCYSTEINE: Homocysteine: 21.9 umol/L — ABNORMAL HIGH (ref 0.0–15.0)

## 2015-05-16 ENCOUNTER — Ambulatory Visit (INDEPENDENT_AMBULATORY_CARE_PROVIDER_SITE_OTHER): Payer: Medicare Other | Admitting: Neurology

## 2015-05-16 ENCOUNTER — Encounter: Payer: Self-pay | Admitting: Neurology

## 2015-05-16 VITALS — BP 125/77 | HR 78 | Ht 63.0 in | Wt 131.0 lb

## 2015-05-16 DIAGNOSIS — I609 Nontraumatic subarachnoid hemorrhage, unspecified: Secondary | ICD-10-CM

## 2015-05-16 DIAGNOSIS — R413 Other amnesia: Secondary | ICD-10-CM | POA: Diagnosis not present

## 2015-05-16 DIAGNOSIS — F05 Delirium due to known physiological condition: Secondary | ICD-10-CM | POA: Diagnosis not present

## 2015-05-16 LAB — PROTEIN C, TOTAL: PROTEIN C, TOTAL: 125 % (ref 70–140)

## 2015-05-16 LAB — BETA-2-GLYCOPROTEIN I ABS, IGG/M/A
Beta-2 Glyco I IgG: <9 GPI IgG units (ref 0–20)
Beta-2-Glycoprotein I IgM: <9 GPI IgM units (ref 0–32)

## 2015-05-16 NOTE — Progress Notes (Signed)
PATIENT: Sydney Coleman DOB: 1950-10-26  Chief Complaint  Patient presents with  . Acute Subarachnoid Hemorrhage    MMSE 25/30 - 13 animals. She is here with her husband, [name], for a hospital follow up. She is still having frequent headaches.  She has also been having hallucinations.  Gives the example of seeing people in her backyard that were not there.  Says these hallucinations are very real to her and have scared her to the point of calling the police.    HISTORICAL  Sydney Coleman is a 64 year old right-handed female, accompanied by her husband, seen in refer by her primary care physician Dr. Consuello Masse for evaluation of memory loss, frequent headaches, visual hallucinations  She is under a lot of stress recently, mother passed away in 10/28/14, she also complains of chronic headaches, she has been taking Fioricet 120 mg each months, she stated that she is getting prescription from her primary care physician.  She reported sudden onset of visual hallucinations, which is new for her, in May 11 2015, she woke up in the morning, saw 2 people in her backyard, looking at her through the windows, the man wear black out fit, with a blond hair female, she called the police man, who did not find any evidence of intruder. 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, left frontal subarachnoid hemorrhage, unknown etiology, 4 vessel angiogram was normal, there was no evidence of AVM, or AV fistula  She was taken to the hospital, was diagnosed with UTI, treated with antibiotics  She denies significant memory trouble, no longer has visual  hallucinations, no visual loss, no gait difficulty, she used to be a Armed forces operational officer, still very active, has chronic headaches for many years, taking Fioricet 120 tablets each month, when I confronted her with excessive Fioricet use, potential barbiturate withdrawal or overuse, she does not want to try different medications for her headaches, insist on"if you do not prescribed Fioricet for me, I will find somebody who will" , she has an empty bottle Fioricet which was last refilled April 12 2015 120 tablets,  REVIEW OF SYSTEMS: Full 14 system review of systems performed and notable only for as above  ALLERGIES: Allergies  Allergen Reactions  . Amoxil [Amoxicillin] Rash  . Other Rash    Elastic    HOME MEDICATIONS: Current Outpatient Prescriptions  Medication Sig Dispense Refill  . butalbital-acetaminophen-caffeine (FIORICET, ESGIC) 50-325-40 MG per tablet Take 1 tablet by mouth 4 (four) times daily as needed for migraine.   5  . clonazePAM (KLONOPIN) 0.5 MG tablet Take 0.5 mg by mouth daily.    Marland Kitchen losartan (COZAAR) 25 MG tablet Take 25 mg by mouth daily.    . pravastatin (PRAVACHOL) 10 MG tablet Take 10 mg by mouth daily.      PAST MEDICAL HISTORY: Past Medical History  Diagnosis Date  . TIA (transient ischemic attack)   . Hypertension   . Chronic headaches   . Anxiety   . Difficult intubation   . SAH (subarachnoid hemorrhage) 05/11/2015  . Cancer of vulva   . Acute spontaneous subarachnoid intracranial  hemorrhage     PAST SURGICAL HISTORY: Past Surgical History  Procedure Laterality Date  . Thyroidectomy, partial    . Placement of breast implants    . Abdominal hysterectomy    . Tonsillectomy      FAMILY HISTORY: Family History  Problem Relation Age of Onset  . Stroke Mother   . Congestive Heart Failure Father   . Heart disease Mother     SOCIAL HISTORY:  History   Social History  . Marital Status: Married    Spouse Name: N/A  . Number of Children: 3  . Years  of Education: HS   Occupational History  . Retired    Social History Main Topics  . Smoking status: Current Every Day Smoker -- 1.00 packs/day    Types: Cigarettes  . Smokeless tobacco: Never Used     Comment: Refuses to tell how much she smokes   . Alcohol Use: No  . Drug Use: No  . Sexual Activity: Not on file   Other Topics Concern  . Not on file   Social History Narrative   Lives at home with her husband.   Right-handed.   2 cups coffee per day.     PHYSICAL EXAM   Filed Vitals:   05/16/15 1409  BP: 125/77  Pulse: 78  Height: 5\' 3"  (1.6 m)  Weight: 131 lb (59.421 kg)    Not recorded      Body mass index is 23.21 kg/(m^2).  PHYSICAL EXAMNIATION:  Gen: NAD, conversant, well nourised, obese, well groomed                     Cardiovascular: Regular rate rhythm, no peripheral edema, warm, nontender. Eyes: Conjunctivae clear without exudates or hemorrhage Neck: Supple, no carotid bruise. Pulmonary: Clear to auscultation bilaterally   NEUROLOGICAL EXAM:  MENTAL STATUS: Speech:    Speech is normal; fluent and spontaneous with normal comprehension.  Cognition:    The patient is  not oriented to date, month    recent and remote memory: She missed 3 out of 3 recalls;     language fluent;     normal attention, concentration,     fund of knowledge.  CRANIAL NERVES: CN II: Visual fields are full to confrontation. Fundoscopic exam is normal with sharp discs and no vascular changes. Pupil equal round reactive to light  CN III, IV, VI: extraocular movement are normal. No ptosis. CN V: Facial sensation is intact to pinprick in all 3 divisions bilaterally. Corneal responses are intact.  CN VII: Face is symmetric with normal eye closure and smile. CN VIII: Hearing is normal to rubbing fingers CN IX, X: Palate elevates symmetrically. Phonation is normal. CN XI: Head turning and shoulder shrug are intact CN XII: Tongue is midline with normal movements and no  atrophy.  MOTOR: There is no pronator drift of out-stretched arms. Muscle bulk and tone are normal. Muscle strength is normal.  REFLEXES: Reflexes are 2+ and symmetric at the biceps, triceps, knees, and ankles. Plantar responses are flexor.  SENSORY: Light touch, pinprick, position sense, and vibration sense are intact in fingers and toes.  COORDINATION: Rapid alternating movements and fine finger movements are intact. There is no dysmetria on finger-to-nose and heel-knee-shin. There are no abnormal or extraneous movements.   GAIT/STANCE: Posture is normal. Gait is steady with normal steps, base, arm swing, and turning. Heel and toe walking are normal. Tandem gait is normal.  Romberg is absent.   DIAGNOSTIC DATA (  LABS, IMAGING, TESTING) - I reviewed patient records, labs, notes, testing and imaging myself where available.  Lab Results  Component Value Date   WBC 9.4 05/11/2015   HGB 15.7 05/11/2015   HCT 45.3 05/11/2015   MCV 99.9 05/11/2015   PLT 293 05/11/2015      Component Value Date/Time   NA 138 05/11/2015 2037   NA 139 07/12/2014 0317   K 3.7 05/11/2015 2037   K 4.1 07/12/2014 0317   CL 101 05/11/2015 2037   CL 108* 07/12/2014 0317   CO2 28 05/11/2015 2037   CO2 24 07/12/2014 0317   GLUCOSE 103* 05/11/2015 2037   GLUCOSE 137* 07/12/2014 0317   BUN 11 05/11/2015 2037   BUN 6* 07/12/2014 0317   CREATININE 1.00 05/11/2015 2037   CREATININE 0.53* 07/12/2014 0317   CALCIUM 9.6 05/11/2015 2037   CALCIUM 8.1* 07/12/2014 0317   PROT 8.0 05/11/2015 2037   PROT 7.7 05/27/2012 0150   ALBUMIN 4.3 05/11/2015 2037   ALBUMIN 3.8 05/27/2012 0150   AST 16 05/11/2015 2037   AST 21 05/27/2012 0150   ALT 13* 05/11/2015 2037   ALT 17 05/27/2012 0150   ALKPHOS 219* 05/11/2015 2037   ALKPHOS 209* 05/27/2012 0150   BILITOT 0.8 05/11/2015 2037   BILITOT 0.5 05/27/2012 0150   GFRNONAA 58* 05/11/2015 2037   GFRNONAA >60 05/27/2012 0150   GFRAA >60 05/11/2015 2037   GFRAA  >60 05/27/2012 0150   ASSESSMENT AND PLAN  APARNA VANDERWEELE is a 64 y.o. female    Left frontal subarachnoid hemorrhage unknown etiology   Holding off antiplatelet treatment Chronic headaches, chronic use of Fioricet, barbiturate abuse  I have suggested her stop chronic Fioricet use Memory loss: Mini-Mental Status Examination 25 out of 30, likely a vascular component  Complete laboratory evaluation   Marcial Pacas, M.D. Ph.D.  [facility] [address] [place], [place] [address] Ph: [phone] Fax: [phone]  CC: Dr. Consuello Masse

## 2015-05-17 ENCOUNTER — Telehealth: Payer: Self-pay | Admitting: Neurology

## 2015-05-17 ENCOUNTER — Encounter: Payer: Self-pay | Admitting: *Deleted

## 2015-05-17 LAB — C-REACTIVE PROTEIN: CRP: 27.7 mg/L — ABNORMAL HIGH (ref 0.0–4.9)

## 2015-05-17 LAB — PHENOBARBITAL LEVEL: Phenobarbital, Serum: <3 ug/mL — ABNORMAL LOW (ref 15–40)

## 2015-05-17 LAB — THYROID PANEL WITH TSH
FREE THYROXINE INDEX: 1.9 (ref 1.2–4.9)
T3 Uptake Ratio: 26 % (ref 24–39)
T4, Total: 7.2 ug/dL (ref 4.5–12.0)
TSH: 2.06 u[IU]/mL (ref 0.450–4.500)

## 2015-05-17 LAB — VITAMIN B12: Vitamin B-12: 458 pg/mL (ref 211–946)

## 2015-05-17 LAB — FOLATE: Folate: <2 ng/mL — ABNORMAL LOW (ref >3.0)

## 2015-05-17 LAB — ANA W/REFLEX IF POSITIVE: Anti Nuclear Antibody(ANA): NEGATIVE

## 2015-05-17 LAB — RPR: RPR: NONREACTIVE

## 2015-05-17 NOTE — Telephone Encounter (Signed)
She is aware of lab results and will start a folic acid supplement.

## 2015-05-17 NOTE — Telephone Encounter (Signed)
Sharyn Lull, please call patient, laboratory showed low folic acid less than 2, she should take over-the-counter folic acid supplement, elevated C reactive protein of unknown clinical significance, may repeat C reactive protein at next visit   rest of the laboratory was normal

## 2015-05-18 NOTE — Addendum Note (Signed)
Addended by: Marcial Pacas on: 05/18/2015 01:22 PM   Modules accepted: Orders

## 2015-05-20 LAB — FACTOR 5 LEIDEN

## 2015-05-20 LAB — PROTHROMBIN GENE MUTATION

## 2015-05-24 DIAGNOSIS — H2511 Age-related nuclear cataract, right eye: Secondary | ICD-10-CM | POA: Diagnosis not present

## 2015-05-24 DIAGNOSIS — H18411 Arcus senilis, right eye: Secondary | ICD-10-CM | POA: Diagnosis not present

## 2015-05-24 DIAGNOSIS — H25011 Cortical age-related cataract, right eye: Secondary | ICD-10-CM | POA: Diagnosis not present

## 2015-05-24 DIAGNOSIS — H18412 Arcus senilis, left eye: Secondary | ICD-10-CM | POA: Diagnosis not present

## 2015-06-07 NOTE — Discharge Summary (Signed)
Physician Discharge Summary  Patient ID: AFTYN NOTT MRN: [id number] DOB/AGE: 05/07/1951 64 y.o.  Admit date: 05/12/2015 Discharge date: 05/13/2015 Admission Diagnoses: Altered mental status with subarachnoid hemorrhage demonstrated on CT of the hea Discharge Diagnoses: Left frontal subarachnoid hemorrhage of cryptogenic etiology with negative diagnostic cerebral catheter angiogram. Active Problems:   SAH (subarachnoid hemorrhage) Headaches Altered mental status ,confusion and hallucinations   Discharged Condition: fair  Hospital Course: Sydney Coleman is an 64 y.o. female with a history of hypertension, TIAs, anxiety and chronic headaches, who presented to the emergency room and [facility] for acute change in mental status. Exact onset is unclear. Her daughter thought that symptoms may have started last night a possible early this morning with confusion and at times visual hallucinations. This evening she was noted to be outside of her house wearing underwear and T-shirt, and was subsequently brought to the emergency room. CT scan of her head showed mild acute subarachnoid hemorrhage involving left frontal region. There was no mass effect. There was no history of recent trauma. Laboratory studies were unremarkable except for urinalysis which showed findings indicative of possible UTI. Patient has been afebrile. Her neurological exam was significant only for slight agitation. There is no neck stiffness or focal neurological deficits noted. Her blood pressure was not significantly elevated. MRI scan of the brain with contrast was obtained which showed enhancing left frontal convexity dural vessels which raise concern for dural AV fistula or a small AVM. There are also 2 incidental tiny lacunar infarcts noted in the right occipital and left parietal white matter. 4 vessel diagnostic cerebral catheter angiogram was performed that showed no evidence of AVM or aneurysms and was normal. ANA and complement levels  were normal. Urine drug screen was positive only for benzos and evaluation rates. Patient had history of smoking and was counseled to quit smoking. She refused a nicotine patch. Her urinalysis was positive for nitrates and 3+ leukocytes and many bacteria. Urine cultures were pending at the time of discharge as patient was not willing to stay further and wanted discharge [place]. Social worker was contacted about IVC paperwork . She was seen by neuro hospitalists on-call Dr. Nicole Kindred at the time of discharge and found to be medically stable. She was advised to take Fioricet for headaches and Pravachol for elevated lipids and quit smoking.  LSN: Unclear tPA Given: No: Acute SAH  Consults: Interventional neuroradiology -Dr Estanislado Pandy Significant Diagnostic Studies:    CT head 05/11/2015 :Mild acute subarachnoid hemorrhage in left frontal region. No evidence of mass effect or midline shift. Stable mild moderate chronic small vessel disease CT Head 05/12/15 : Small amount of residual LEFT frontal subarachnoid hemorrhage. At least moderate white matter changes compatible with chronic small vessel ischemic disease MRI Brain 05/12/15 : 1. Acute left frontal subarachnoid hemorrhage. Leptomeningeal enhancement and mildly prominent cortical vessels are present in this region, and a small pial AVM or AV fistula is not excluded. Consider catheter angiography for further evaluation. 2. Evidence of likely chronic subarachnoid hemorrhage in the right frontal and left parieto-occipital regions. 3. Subcentimeter acute infarcts in the left parietal and right occipital lobes. 4. Extensive chronic small vessel ischemia in the cerebral white matter, progressed from prior MRI. 5. No major intracranial arterial occlusion or significant proximal stenosis. 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 Cerebral catheter angio 05/13/2015 :Angiographically no evidence of arteriovenous shunting to  suggest the presence of a dural AV fistula, arteriovenous malformation, or of dissections involving the  left anterior cerebral circulation. 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 rate 14, height 5\' 3"  (1.6 m), weight 135 lb 12.9 oz (61.6 kg), SpO2 99 %.  PHYSICAL EXAM Pleasant frail middle-aged Caucasian lady currently not in distress. . Afebrile. Head is nontraumatic. Neck is supple without bruit. Cardiac exam no murmur or gallop. Lungs are clear to auscultation. Distal pulses are well felt. Neurological Exam ;  Awake Alert oriented x 3. Normal speech and language.mildly diminished attention, registration and recall. Follows 3 step commands. eye movements full without nystagmus.fundi were not visualized. Vision acuity and fields appear normal. Hearing is normal. Palatal movements are normal. Face symmetric. Tongue midline. Normal strength, tone, reflexes and coordination. Normal sensation. Gait deferred  Disposition: 01-Home or Self Care    Home Discharge Medications   Medication List    ASK your doctor about these medications        butalbital-acetaminophen-caffeine 50-325-40 MG per tablet  Commonly known as:  FIORICET, ESGIC  Take 1 tablet by mouth 4 (four) times daily as needed for migraine.     clonazePAM 0.5 MG tablet  Commonly known as:  KLONOPIN  Take 0.5 mg by mouth daily.     losartan 25 MG tablet  Commonly known as:  COZAAR  Take 25 mg by mouth daily.     pravastatin 10 MG tablet  Commonly known as:  PRAVACHOL  Take 10 mg by mouth daily.        If d/c to Inpatient Rehab, Medications to to continued on Rehab       Follow-up Information    Call  Jairo Bellew, MD.   Specialties:  Neurology, Radiology   Contact information:   [address] [place] [address] [phone]       Signed: Antony Contras 06/07/2015, 2:28 PM

## 2015-06-08 DIAGNOSIS — I10 Essential (primary) hypertension: Secondary | ICD-10-CM | POA: Diagnosis not present

## 2015-06-08 DIAGNOSIS — R748 Abnormal levels of other serum enzymes: Secondary | ICD-10-CM | POA: Diagnosis not present

## 2015-06-08 DIAGNOSIS — E782 Mixed hyperlipidemia: Secondary | ICD-10-CM | POA: Diagnosis not present

## 2015-06-08 DIAGNOSIS — F411 Generalized anxiety disorder: Secondary | ICD-10-CM | POA: Diagnosis not present

## 2015-06-08 DIAGNOSIS — Z720 Tobacco use: Secondary | ICD-10-CM | POA: Diagnosis not present

## 2015-06-09 ENCOUNTER — Telehealth: Payer: Self-pay | Admitting: Neurology

## 2015-06-09 NOTE — Telephone Encounter (Signed)
I called Sydney Coleman and she did not answer, left her a message to call back and to schedule her EEG.Marland KitchenMarland KitchenI did tell her there was an opening 06/24/2015 in the am if she was available.

## 2015-06-09 NOTE — Telephone Encounter (Signed)
Patient is calling to see if she is to schedule an EEG. Please call and advise. Thank you.

## 2015-06-09 NOTE — Telephone Encounter (Signed)
Sydney Coleman is calling to schedule patient an appointment.

## 2015-06-13 NOTE — Telephone Encounter (Signed)
Called pt and scheduled EEG..the patient stated she does not want to see Krista Blue again but wants to see Leonie Man again..so after EEG f/u with Leonie Man

## 2015-06-13 NOTE — Telephone Encounter (Signed)
She will schedule her follow up after she has her EEG on 07/12/15.

## 2015-06-16 ENCOUNTER — Ambulatory Visit: Payer: Medicare Other | Admitting: Neurology

## 2015-07-12 ENCOUNTER — Ambulatory Visit (INDEPENDENT_AMBULATORY_CARE_PROVIDER_SITE_OTHER): Payer: Medicare Other | Admitting: Neurology

## 2015-07-12 ENCOUNTER — Ambulatory Visit: Payer: Medicare Other | Admitting: Neurology

## 2015-07-12 DIAGNOSIS — R413 Other amnesia: Secondary | ICD-10-CM

## 2015-07-12 DIAGNOSIS — I609 Nontraumatic subarachnoid hemorrhage, unspecified: Secondary | ICD-10-CM

## 2015-07-12 DIAGNOSIS — R410 Disorientation, unspecified: Secondary | ICD-10-CM

## 2015-07-12 DIAGNOSIS — F05 Delirium due to known physiological condition: Secondary | ICD-10-CM

## 2015-07-13 NOTE — Procedures (Signed)
   HISTORY: 64 year old female, with history of memory loss, headache, also complains of visual hallucinations, previous history of left frontal subarachnoid hemorrhage  TECHNIQUE:  16 channel EEG was performed based on standard 10-16 international system. One channel was dedicated to EKG, which has demonstrates normal sinus rhythm of 66 beats per minutes.  Upon awakening, the posterior background activity was well-developed, in alpha range, 9 Hz , reactive to eye opening and closure.  There was no evidence of epileptiform discharge.  Photic stimulation was performed, which induced a symmetric photic driving.  Hyperventilation was performed, there was no abnormality elicit.  No sleep was achieved.  CONCLUSION: This is a  normal awake EEG.  There is no electrodiagnostic evidence of epileptiform discharge

## 2015-07-15 DIAGNOSIS — H25012 Cortical age-related cataract, left eye: Secondary | ICD-10-CM | POA: Diagnosis not present

## 2015-07-15 DIAGNOSIS — H25042 Posterior subcapsular polar age-related cataract, left eye: Secondary | ICD-10-CM | POA: Diagnosis not present

## 2015-07-15 DIAGNOSIS — H2512 Age-related nuclear cataract, left eye: Secondary | ICD-10-CM | POA: Diagnosis not present

## 2015-07-15 DIAGNOSIS — H25011 Cortical age-related cataract, right eye: Secondary | ICD-10-CM | POA: Diagnosis not present

## 2015-07-15 DIAGNOSIS — H25811 Combined forms of age-related cataract, right eye: Secondary | ICD-10-CM | POA: Diagnosis not present

## 2015-07-15 DIAGNOSIS — H2511 Age-related nuclear cataract, right eye: Secondary | ICD-10-CM | POA: Diagnosis not present

## 2015-07-18 ENCOUNTER — Telehealth: Payer: Self-pay | Admitting: Neurology

## 2015-07-18 NOTE — Telephone Encounter (Signed)
Pt called requesting EEG results. Please call and advise at [phone].

## 2015-07-18 NOTE — Telephone Encounter (Signed)
Please call her, EEG was normal

## 2015-07-18 NOTE — Telephone Encounter (Signed)
Spoke to Dominga - she is aware of results.

## 2015-08-01 ENCOUNTER — Emergency Department (HOSPITAL_COMMUNITY)
Admission: EM | Admit: 2015-08-01 | Discharge: 2015-08-01 | Disposition: A | Discharge status: Home / Self Care | Payer: Medicare Other | Attending: Emergency Medicine | Admitting: Emergency Medicine

## 2015-08-01 ENCOUNTER — Emergency Department (HOSPITAL_COMMUNITY): Payer: Medicare Other

## 2015-08-01 ENCOUNTER — Encounter (HOSPITAL_COMMUNITY): Payer: Self-pay | Admitting: Emergency Medicine

## 2015-08-01 DIAGNOSIS — Z720 Tobacco use: Secondary | ICD-10-CM | POA: Insufficient documentation

## 2015-08-01 DIAGNOSIS — S82041A Displaced comminuted fracture of right patella, initial encounter for closed fracture: Secondary | ICD-10-CM | POA: Insufficient documentation

## 2015-08-01 DIAGNOSIS — Z859 Personal history of malignant neoplasm, unspecified: Secondary | ICD-10-CM | POA: Insufficient documentation

## 2015-08-01 DIAGNOSIS — M25561 Pain in right knee: Secondary | ICD-10-CM | POA: Diagnosis not present

## 2015-08-01 DIAGNOSIS — G8929 Other chronic pain: Secondary | ICD-10-CM | POA: Diagnosis not present

## 2015-08-01 DIAGNOSIS — Z7982 Long term (current) use of aspirin: Secondary | ICD-10-CM | POA: Insufficient documentation

## 2015-08-01 DIAGNOSIS — Y998 Other external cause status: Secondary | ICD-10-CM | POA: Diagnosis not present

## 2015-08-01 DIAGNOSIS — S82001A Unspecified fracture of right patella, initial encounter for closed fracture: Secondary | ICD-10-CM | POA: Diagnosis not present

## 2015-08-01 DIAGNOSIS — S8991XA Unspecified injury of right lower leg, initial encounter: Secondary | ICD-10-CM | POA: Diagnosis present

## 2015-08-01 DIAGNOSIS — Y9389 Activity, other specified: Secondary | ICD-10-CM | POA: Insufficient documentation

## 2015-08-01 DIAGNOSIS — Y92480 Sidewalk as the place of occurrence of the external cause: Secondary | ICD-10-CM | POA: Diagnosis not present

## 2015-08-01 DIAGNOSIS — Z8673 Personal history of transient ischemic attack (TIA), and cerebral infarction without residual deficits: Secondary | ICD-10-CM | POA: Insufficient documentation

## 2015-08-01 DIAGNOSIS — W01198A Fall on same level from slipping, tripping and stumbling with subsequent striking against other object, initial encounter: Secondary | ICD-10-CM | POA: Diagnosis not present

## 2015-08-01 DIAGNOSIS — Z79899 Other long term (current) drug therapy: Secondary | ICD-10-CM | POA: Insufficient documentation

## 2015-08-01 DIAGNOSIS — S50312A Abrasion of left elbow, initial encounter: Secondary | ICD-10-CM | POA: Insufficient documentation

## 2015-08-01 DIAGNOSIS — T148 Other injury of unspecified body region: Secondary | ICD-10-CM | POA: Diagnosis not present

## 2015-08-01 DIAGNOSIS — I10 Essential (primary) hypertension: Secondary | ICD-10-CM | POA: Insufficient documentation

## 2015-08-01 DIAGNOSIS — M7989 Other specified soft tissue disorders: Secondary | ICD-10-CM | POA: Diagnosis not present

## 2015-08-01 MED ORDER — BACITRACIN ZINC 500 UNIT/GM EX OINT
1.0000 "application " | TOPICAL_OINTMENT | Freq: Once | CUTANEOUS | Status: AC
  Administered 2015-08-01: 1 via TOPICAL
  Filled 2015-08-01: qty 0.9

## 2015-08-01 MED ORDER — HYDROCODONE-ACETAMINOPHEN 5-325 MG PO TABS: 1.0000 | ORAL_TABLET | ORAL | Status: DC | PRN

## 2015-08-01 NOTE — ED Notes (Addendum)
Pt requested to not be put in pt gown.

## 2015-08-01 NOTE — ED Provider Notes (Signed)
CSN: [id number]     Arrival date & time 08/01/15  1326 History   First MD Initiated Contact with Patient 08/01/15 1328     Chief Complaint  Patient presents with  . Knee Pain  . Elbow Pain    HPI Patient presents to the emergency room with complaints of knee injury.  She was walking outside when she tripped and stumbled on an uneven area on the sidewalk.  Patient fell landing on her right knee as well as her right hand. She also scraped her left elbow. Primarily is having pain in her right knee right now. She denies any trouble with hip pain or elbow pain. She denies any head injury. No loss of consciousness. No neck or back pain. Past Medical History  Diagnosis Date  . TIA (transient ischemic attack)   . Hypertension   . Chronic headaches   . Anxiety   . Difficult intubation   . SAH (subarachnoid hemorrhage) ([place]) 05/11/2015  . Cancer of vulva ([place])   . Acute spontaneous subarachnoid intracranial hemorrhage [facility])    Past Surgical History  Procedure Laterality Date  . Thyroidectomy, partial    . Placement of breast implants    . Abdominal hysterectomy    . Tonsillectomy    . Joint replacement     Family History  Problem Relation Age of Onset  . Stroke Mother   . Congestive Heart Failure Father   . Heart disease Mother    Social History  Substance Use Topics  . Smoking status: Current Every Day Smoker -- 1.00 packs/day    Types: Cigarettes  . Smokeless tobacco: Never Used     Comment: Refuses to tell how much she smokes   . Alcohol Use: No   OB History    No data available     Review of Systems  All other systems reviewed and are negative.     Allergies  Amoxil; Augmentin; and Other  Home Medications   Prior to Admission medications   Medication Sig Start Date End Date Taking? Authorizing Provider  aspirin 81 MG tablet Take 81 mg by mouth every morning.   Yes Historical Provider, MD  clonazePAM (KLONOPIN) 0.5 MG tablet Take 0.5 mg by mouth daily as needed  for anxiety.    Yes Historical Provider, MD  cyanocobalamin 100 MCG tablet Take 100 mcg by mouth daily.   Yes Historical Provider, MD  FOLIC ACID PO Take 1 tablet by mouth daily.    Yes Historical Provider, MD  losartan (COZAAR) 25 MG tablet Take 25 mg by mouth at bedtime.    Yes Historical Provider, MD  pravastatin (PRAVACHOL) 20 MG tablet Take 20 mg by mouth daily.   Yes Historical Provider, MD  HYDROcodone-acetaminophen (NORCO/VICODIN) 5-325 MG tablet Take 1 tablet by mouth every 4 (four) hours as needed. 08/01/15   Dorie Rank, MD   BP 117/79 mmHg  Pulse 60  Temp(Src) 97.3 F (36.3 C) (Oral)  Resp 16  Ht 5\' 3"  (1.6 m)  Wt 138 lb (62.596 kg)  BMI 24.45 kg/m2  SpO2 100% Physical Exam  Constitutional: No distress.  HENT:  Head: Normocephalic and atraumatic.  Right Ear: External ear normal.  Left Ear: External ear normal.  Eyes: Conjunctivae are normal. Right eye exhibits no discharge. Left eye exhibits no discharge. No scleral icterus.  Neck: Neck supple. No tracheal deviation present.  Cardiovascular: Normal rate, regular rhythm and intact distal pulses.   Pulmonary/Chest: Effort normal and breath sounds normal. No stridor. No  respiratory distress. She has no wheezes. She has no rales.  Abdominal: Soft. Bowel sounds are normal. She exhibits no distension. There is no tenderness. There is no rebound and no guarding.  Musculoskeletal: She exhibits no edema.       Right shoulder: Normal.       Left shoulder: Normal.       Left elbow: She exhibits no laceration. No tenderness found.       Right hip: Normal.       Left hip: Normal.       Right knee: She exhibits decreased range of motion and swelling. Tenderness found.       Right ankle: Normal.       Left ankle: Normal.       Cervical back: Normal.       Thoracic back: Normal.       Lumbar back: Normal.  Small abrasion noted left elbow, full range of motion,  Neurological: She is alert. She has normal strength. No cranial nerve  deficit (no facial droop, extraocular movements intact, no slurred speech) or sensory deficit. She exhibits normal muscle tone. She displays no seizure activity. Coordination normal.  Skin: Skin is warm and dry. No rash noted.  Psychiatric: She has a normal mood and affect.  Nursing note and vitals reviewed.   ED Course  Procedures (including critical care time) Labs Review Labs Reviewed - No data to display  Imaging Review Dg Knee Complete 4 Views Right  08/01/2015  CLINICAL DATA:  Pt tripped with a flip flop shoe when it was caught on the sidewalk outside, right knee pain swelling, anterior abrasion to anterior right knee EXAM: RIGHT KNEE - COMPLETE 4+ VIEW COMPARISON:  None. FINDINGS: Mild narrowing of the medial compartment. There is a comminuted fracture through the central patella. Fracture fragments are mildly displaced. There is a moderate joint effusion and there is prepatellar soft tissue swelling. Fracture line is primarily horizontal in orientation with 2 dominant fracture components in several small satellite fragments. IMPRESSION: Fracture of the patella Electronically Signed   By: Skipper Cliche M.D.   On: 08/01/2015 14:45      MDM   Final diagnoses:  Patella fracture, right, closed, initial encounter    The patient's x-ray shows a patellar fracture. I reviewed the films with the patient.  Patient will be given a knee immobilizer. I ordered crutches. Plan on discharge home with outpatient orthopedic follow-up.    Dorie Rank, MD 08/01/15 [id number]

## 2015-08-01 NOTE — ED Notes (Signed)
MD at bedside. 

## 2015-08-01 NOTE — ED Notes (Signed)
Pt requests that only the right knee be dressed at this time.

## 2015-08-01 NOTE — Discharge Instructions (Signed)
Knee Fracture, Adult A knee fracture is a break in a bone of the knee. The break may be in the kneecap (patella), the lower part of the thigh bone (femur), or the upper part of the shin bone (tibia). There are several types of fractures. They include:  Stable. In this type of fracture, the bones of the knee remain in place after the break.  Displaced. In this type of fracture, the bones no longer line up after the break.  Comminuted. In this type of fracture, the bone breaks into several pieces.  Open. In this type of fracture, the broken bone comes through the skin. CAUSES This injury is usually caused by a fall. This injury can happen because of the impact of the fall or from a violent contraction of the leg muscles before you hit the ground. It can also result from a car accident or a collision with a hard surface. RISK FACTORS This injury is more likely to develop in people who:  Are female.  Are 98-48 years old.  Participate in high-energy sports.  Have a condition that weakens the bones, such as osteoporosis.  Have had a knee replacement. SYMPTOMS Symptoms of this injury include:  Pain.  Swelling.  Bruising.  Inability to bend your knee.  Misshapen knee.  Inability to walk.  Inability to use your injured leg to support your body weight. DIAGNOSIS This injury is diagnosed with a physical exam. Your health care provider may also order:  Imaging studies, such as an X-ray, CT scan, MRI scan, or ultrasound.  A procedure called arthroscopy to view the inside of your knee with a small camera. TREATMENT Treatment for this injury may involve:  Wearing a splint until swelling goes down.  Wearing a cast to keep the fractured bone from moving while it heals. A cast is usually put on after swelling has gone down.  Surgery to move a bone back into place. HOME CARE INSTRUCTIONS If You Have a Splint:  Wear it as directed by your health care provider. Remove it only as  directed by your health care provider.  Loosen the splint if your toes become numb and tingle, or if they turn cold and blue.  Do not put pressure on any part of your splint. If You Have a Cast:  Do not stick anything inside the cast to scratch your skin. Doing that increases your risk of infection.  Check the skin around the cast every day. Report any concerns to your health care provider. You may put lotion on dry skin around the edges of the cast. Do not apply lotion to the skin underneath the cast. Bathing  Cover the cast or splint with a watertight plastic bag to protect it from water while you take a bath or a shower. Do not let the cast or splint get wet. Managing Pain, Stiffness, and Swelling  If directed, apply ice to the injured area:  Put ice in a plastic bag.  Place a towel between your skin and the bag.  Leave the ice on for 20 minutes, 2-3 times a day.  Move your toes often to avoid stiffness and to lessen swelling.  Raise the injured area above the level of your heart while you are lying down. Driving  Do not drive or operate heavy machinery while taking pain medicine.  Do not drive while wearing a cast or splint on a hand or foot that you use for driving. Activity  Return to your normal activities as directed  by your health care provider. Ask your health care provider what activities are safe for you. General Instructions  Do not put pressure on any part of the cast or splint until it is fully hardened. This may take several hours.  Keep the cast or splint clean and dry.  Do not use any tobacco products, including cigarettes, chewing tobacco, or electronic cigarettes. Tobacco can delay bone healing. If you need help quitting, ask your health care provider.  Take medicines only as directed by your health care provider.  Keep all follow-up visits as directed by your health care provider. This is important. SEEK MEDICAL CARE IF:  You have knee pain and  swelling.  You have trouble walking.  Your cast becomes wet or damaged or suddenly feels too tight. SEEK IMMEDIATE MEDICAL CARE IF:  Your pain and swelling get worse.  You have severe pain below the fracture.  Your skin or toenails turn blue or gray, feel cold, or become numb.  You have fluid, blood, or pus coming from under your cast.   This information is not intended to replace advice given to you by your health care provider. Make sure you discuss any questions you have with your health care provider.   Document Released: 08/14/2006 Document Revised: 10/22/2014 Document Reviewed: 06/02/2014 Elsevier Interactive Patient Education [employer].

## 2015-08-01 NOTE — ED Notes (Signed)
Patient reporting she does not want to use crutches, advised she has a wheelchair at home she will use.

## 2015-08-01 NOTE — ED Notes (Signed)
Pt was outside on the sidewalk smoking a cigarette when she fell. Pt states that the sidewalk was uneven and caused her to fall. Pt is c/o right knee pain, left elbow and hand contusion. EMS states she has no deformity to knee. Pt denies neck and back pain. Pt denies feeling dizzy prior to falling.

## 2015-08-05 ENCOUNTER — Encounter
Admission: RE | Admit: 2015-08-05 | Discharge: 2015-08-05 | Disposition: A | Discharge status: Home / Self Care | Payer: Medicare Other | Source: Ambulatory Visit | Attending: Specialist | Admitting: Specialist

## 2015-08-05 DIAGNOSIS — S82031A Displaced transverse fracture of right patella, initial encounter for closed fracture: Secondary | ICD-10-CM | POA: Diagnosis not present

## 2015-08-05 DIAGNOSIS — Z01818 Encounter for other preprocedural examination: Secondary | ICD-10-CM | POA: Insufficient documentation

## 2015-08-05 HISTORY — DX: Unspecified osteoarthritis, unspecified site: M19.90

## 2015-08-05 HISTORY — DX: Cerebral infarction, unspecified: I63.9

## 2015-08-05 HISTORY — DX: Other specified postprocedural states: R11.2

## 2015-08-05 HISTORY — DX: Other specified postprocedural states: Z98.890

## 2015-08-05 LAB — CBC
HEMATOCRIT: 40 % (ref 35.0–47.0)
Hemoglobin: 13.7 g/dL (ref 12.0–16.0)
MCH: 34.2 pg — AB (ref 26.0–34.0)
MCHC: 34.2 g/dL (ref 32.0–36.0)
MCV: 100 fL (ref 80.0–100.0)
PLATELETS: 251 10*3/uL (ref 150–440)
RBC: 4 MIL/uL (ref 3.80–5.20)
RDW: 13.1 % (ref 11.5–14.5)
WBC: 7.3 10*3/uL (ref 3.6–11.0)

## 2015-08-05 LAB — DIFFERENTIAL
BASOS PCT: 1 %
Basophils Absolute: 0.1 10*3/uL (ref 0–0.1)
EOS PCT: 3 %
Eosinophils Absolute: 0.2 10*3/uL (ref 0–0.7)
Lymphocytes Relative: 39 %
Lymphs Abs: 2.8 10*3/uL (ref 1.0–3.6)
MONO ABS: 0.5 10*3/uL (ref 0.2–0.9)
Monocytes Relative: 7 %
Neutro Abs: 3.7 10*3/uL (ref 1.4–6.5)
Neutrophils Relative %: 50 %

## 2015-08-05 LAB — BASIC METABOLIC PANEL
Anion gap: 7 (ref 5–15)
BUN: 12 mg/dL (ref 6–20)
CO2: 28 mmol/L (ref 22–32)
Calcium: 8.8 mg/dL — ABNORMAL LOW (ref 8.9–10.3)
Chloride: 103 mmol/L (ref 101–111)
Creatinine, Ser: 0.61 mg/dL (ref 0.44–1.00)
GFR calc Af Amer: >60 mL/min (ref >60)
GFR calc non Af Amer: >60 mL/min (ref >60)
Glucose, Bld: 106 mg/dL — ABNORMAL HIGH (ref 65–99)
POTASSIUM: 3.9 mmol/L (ref 3.5–5.1)
Sodium: 138 mmol/L (ref 135–145)

## 2015-08-05 NOTE — Patient Instructions (Signed)
  Your procedure is scheduled on: August 08, 2015(Monday) Report to Day Surgery.[facility]) To find out your arrival time please call [phone] between 1PM - 3PM on Arrival time is 11:15 A.M.  Remember: Instructions that are not followed completely may result in serious medical risk, up to and including death, or upon the discretion of your surgeon and anesthesiologist your surgery may need to be rescheduled.    __x__ 1. Do not eat food or drink liquids after midnight. No gum chewing or hard candies.     ____ 2. No Alcohol for 24 hours before or after surgery.   ____ 3. Bring all medications with you on the day of surgery if instructed.    __x__ 4. Notify your doctor if there is any change in your medical condition     (cold, fever, infections).     Do not wear jewelry, make-up, hairpins, clips or nail polish.  Do not wear lotions, powders, or perfumes. You may wear deodorant.  Do not shave 48 hours prior to surgery. Men may shave face and neck.  Do not bring valuables to the hospital.    [facility] is not responsible for any belongings or valuables.               Contacts, dentures or bridgework may not be worn into surgery.  Leave your suitcase in the car. After surgery it may be brought to your room.  For patients admitted to the hospital, discharge time is determined by your                treatment team.   Patients discharged the day of surgery will not be allowed to drive home.   Please read over the following fact sheets that you were given:   Surgical Site Infection Prevention   ____ Take these medicines the morning of surgery with A SIP OF WATER:    1. Clonazepam  2. Losartan  3.   4.  5.  6.  ____ Fleet Enema (as directed)   _x__ Use CHG Soap as directed  ____ Use inhalers on the day of surgery  ____ Stop metformin 2 days prior to surgery    ____ Take 1/2 of usual insulin dose the night before surgery and none on the morning of surgery.   __x__  Stop Coumadin/Plavix/aspirin on (STOP ASPIRIN NOW)  __x__ Stop Anti-inflammatories on (Tylenol ok to take for pain if needed)   ____ Stop supplements until after surgery.    ____ Bring C-Pap to the hospital.

## 2015-08-08 ENCOUNTER — Telehealth: Payer: Self-pay | Admitting: Neurology

## 2015-08-08 NOTE — Telephone Encounter (Signed)
Received paperwork from [facility] - her last office visit was 05/16/15 and she was already off ASA - information faxed and confirmed to [phone].

## 2015-08-08 NOTE — Telephone Encounter (Addendum)
She is off aspirin now due to reported left frontal subarachnoid hemorrhage  She is seen for memory loss, clear to proceed for emergency surgery from neurology standpoint

## 2015-08-09 ENCOUNTER — Ambulatory Visit
Admission: RE | Admit: 2015-08-09 | Discharge: 2015-08-09 | Disposition: A | Discharge status: Home / Self Care | Payer: Medicare Other | Source: Ambulatory Visit | Attending: Orthopedic Surgery | Admitting: Orthopedic Surgery

## 2015-08-09 ENCOUNTER — Encounter
Admission: RE | Admit: 2015-08-09 | Discharge: 2015-08-09 | Disposition: A | Discharge status: Home / Self Care | Payer: Medicare Other | Source: Ambulatory Visit | Attending: Orthopedic Surgery | Admitting: Orthopedic Surgery

## 2015-08-09 DIAGNOSIS — F419 Anxiety disorder, unspecified: Secondary | ICD-10-CM | POA: Diagnosis not present

## 2015-08-09 DIAGNOSIS — M810 Age-related osteoporosis without current pathological fracture: Secondary | ICD-10-CM | POA: Diagnosis not present

## 2015-08-09 DIAGNOSIS — Z888 Allergy status to other drugs, medicaments and biological substances status: Secondary | ICD-10-CM | POA: Diagnosis not present

## 2015-08-09 DIAGNOSIS — M6281 Muscle weakness (generalized): Secondary | ICD-10-CM | POA: Diagnosis not present

## 2015-08-09 DIAGNOSIS — R2689 Other abnormalities of gait and mobility: Secondary | ICD-10-CM | POA: Diagnosis not present

## 2015-08-09 DIAGNOSIS — F17210 Nicotine dependence, cigarettes, uncomplicated: Secondary | ICD-10-CM | POA: Diagnosis not present

## 2015-08-09 DIAGNOSIS — S82001A Unspecified fracture of right patella, initial encounter for closed fracture: Secondary | ICD-10-CM | POA: Diagnosis not present

## 2015-08-09 DIAGNOSIS — E785 Hyperlipidemia, unspecified: Secondary | ICD-10-CM | POA: Diagnosis not present

## 2015-08-09 DIAGNOSIS — Z419 Encounter for procedure for purposes other than remedying health state, unspecified: Secondary | ICD-10-CM

## 2015-08-09 DIAGNOSIS — I10 Essential (primary) hypertension: Secondary | ICD-10-CM | POA: Diagnosis not present

## 2015-08-09 DIAGNOSIS — Z01818 Encounter for other preprocedural examination: Secondary | ICD-10-CM | POA: Diagnosis not present

## 2015-08-09 DIAGNOSIS — S82031A Displaced transverse fracture of right patella, initial encounter for closed fracture: Secondary | ICD-10-CM | POA: Diagnosis not present

## 2015-08-09 DIAGNOSIS — M1990 Unspecified osteoarthritis, unspecified site: Secondary | ICD-10-CM | POA: Diagnosis not present

## 2015-08-09 DIAGNOSIS — Z881 Allergy status to other antibiotic agents status: Secondary | ICD-10-CM | POA: Diagnosis not present

## 2015-08-09 DIAGNOSIS — Z8673 Personal history of transient ischemic attack (TIA), and cerebral infarction without residual deficits: Secondary | ICD-10-CM | POA: Diagnosis not present

## 2015-08-09 DIAGNOSIS — Z7982 Long term (current) use of aspirin: Secondary | ICD-10-CM | POA: Diagnosis not present

## 2015-08-09 DIAGNOSIS — M25561 Pain in right knee: Secondary | ICD-10-CM | POA: Diagnosis not present

## 2015-08-09 DIAGNOSIS — S82041A Displaced comminuted fracture of right patella, initial encounter for closed fracture: Secondary | ICD-10-CM | POA: Diagnosis not present

## 2015-08-09 DIAGNOSIS — Z8544 Personal history of malignant neoplasm of other female genital organs: Secondary | ICD-10-CM | POA: Diagnosis not present

## 2015-08-09 DIAGNOSIS — Z79899 Other long term (current) drug therapy: Secondary | ICD-10-CM | POA: Diagnosis not present

## 2015-08-09 LAB — URINALYSIS COMPLETE WITH MICROSCOPIC (ARMC ONLY)
Bilirubin Urine: NEGATIVE
Glucose, UA: NEGATIVE mg/dL
Hgb urine dipstick: NEGATIVE
Ketones, ur: NEGATIVE mg/dL
Nitrite: POSITIVE — AB
PROTEIN: NEGATIVE mg/dL
Specific Gravity, Urine: 1.009 (ref 1.005–1.030)
pH: 6 (ref 5.0–8.0)

## 2015-08-09 LAB — PROTIME-INR
INR: 0.92
Prothrombin Time: 12.6 seconds (ref 11.4–15.0)

## 2015-08-09 LAB — APTT: APTT: 28 s (ref 24–36)

## 2015-08-10 ENCOUNTER — Ambulatory Visit: Payer: Medicare Other | Admitting: Anesthesiology

## 2015-08-10 ENCOUNTER — Encounter: Payer: Self-pay | Admitting: *Deleted

## 2015-08-10 ENCOUNTER — Encounter
Admission: RE | Disposition: A | Discharge status: Home / Self Care | Payer: Self-pay | Source: Ambulatory Visit | Attending: Orthopedic Surgery

## 2015-08-10 ENCOUNTER — Inpatient Hospital Stay
Admission: RE | Admit: 2015-08-10 | Discharge: 2015-08-11 | DRG: 517 | Disposition: A | Discharge status: Home / Self Care | Payer: Medicare Other | Source: Ambulatory Visit | Attending: Orthopedic Surgery | Admitting: Orthopedic Surgery

## 2015-08-10 ENCOUNTER — Ambulatory Visit: Payer: Medicare Other

## 2015-08-10 DIAGNOSIS — Z8544 Personal history of malignant neoplasm of other female genital organs: Secondary | ICD-10-CM | POA: Diagnosis not present

## 2015-08-10 DIAGNOSIS — F419 Anxiety disorder, unspecified: Secondary | ICD-10-CM | POA: Diagnosis present

## 2015-08-10 DIAGNOSIS — M1990 Unspecified osteoarthritis, unspecified site: Secondary | ICD-10-CM | POA: Diagnosis present

## 2015-08-10 DIAGNOSIS — W19XXXA Unspecified fall, initial encounter: Secondary | ICD-10-CM | POA: Diagnosis present

## 2015-08-10 DIAGNOSIS — S82041A Displaced comminuted fracture of right patella, initial encounter for closed fracture: Secondary | ICD-10-CM | POA: Diagnosis not present

## 2015-08-10 DIAGNOSIS — Z8673 Personal history of transient ischemic attack (TIA), and cerebral infarction without residual deficits: Secondary | ICD-10-CM | POA: Diagnosis not present

## 2015-08-10 DIAGNOSIS — Z419 Encounter for procedure for purposes other than remedying health state, unspecified: Secondary | ICD-10-CM

## 2015-08-10 DIAGNOSIS — Z79899 Other long term (current) drug therapy: Secondary | ICD-10-CM

## 2015-08-10 DIAGNOSIS — I10 Essential (primary) hypertension: Secondary | ICD-10-CM | POA: Diagnosis present

## 2015-08-10 DIAGNOSIS — S82001A Unspecified fracture of right patella, initial encounter for closed fracture: Secondary | ICD-10-CM | POA: Diagnosis not present

## 2015-08-10 DIAGNOSIS — S82031A Displaced transverse fracture of right patella, initial encounter for closed fracture: Secondary | ICD-10-CM | POA: Diagnosis not present

## 2015-08-10 DIAGNOSIS — F17210 Nicotine dependence, cigarettes, uncomplicated: Secondary | ICD-10-CM | POA: Diagnosis present

## 2015-08-10 DIAGNOSIS — Z7982 Long term (current) use of aspirin: Secondary | ICD-10-CM

## 2015-08-10 DIAGNOSIS — Z881 Allergy status to other antibiotic agents status: Secondary | ICD-10-CM | POA: Diagnosis not present

## 2015-08-10 DIAGNOSIS — S82009A Unspecified fracture of unspecified patella, initial encounter for closed fracture: Secondary | ICD-10-CM | POA: Diagnosis present

## 2015-08-10 DIAGNOSIS — Y929 Unspecified place or not applicable: Secondary | ICD-10-CM

## 2015-08-10 DIAGNOSIS — Z888 Allergy status to other drugs, medicaments and biological substances status: Secondary | ICD-10-CM | POA: Diagnosis not present

## 2015-08-10 HISTORY — PX: ORIF PATELLA: SHX5033

## 2015-08-10 SURGERY — OPEN REDUCTION INTERNAL FIXATION (ORIF) PATELLA
Anesthesia: General | Laterality: Right

## 2015-08-10 MED ORDER — BUPIVACAINE HCL (PF) 0.5 % IJ SOLN
INTRAMUSCULAR | Status: DC | PRN
  Administered 2015-08-10: 30 mL

## 2015-08-10 MED ORDER — POLYETHYLENE GLYCOL 3350 17 G PO PACK: 17.0000 g | PACK | Freq: Every day | ORAL | Status: DC | PRN

## 2015-08-10 MED ORDER — CLINDAMYCIN PHOSPHATE 600 MG/50ML IV SOLN: 600.0000 mg | Freq: Once | INTRAVENOUS | Status: DC

## 2015-08-10 MED ORDER — PRAVASTATIN SODIUM 20 MG PO TABS
20.0000 mg | ORAL_TABLET | Freq: Every day | ORAL | Status: DC
  Administered 2015-08-11: 20 mg via ORAL
  Filled 2015-08-10 (×2): qty 1

## 2015-08-10 MED ORDER — CLINDAMYCIN PHOSPHATE 600 MG/50ML IV SOLN
INTRAVENOUS | Status: AC
  Administered 2015-08-10: 600 mg via INTRAVENOUS
  Filled 2015-08-10: qty 50

## 2015-08-10 MED ORDER — ONDANSETRON HCL 4 MG/2ML IJ SOLN
INTRAMUSCULAR | Status: DC | PRN
  Administered 2015-08-10: 4 mg via INTRAVENOUS

## 2015-08-10 MED ORDER — PHENOL 1.4 % MT LIQD: 1.0000 | OROMUCOSAL | Status: DC | PRN

## 2015-08-10 MED ORDER — OXYCODONE HCL 5 MG/5ML PO SOLN: 5.0000 mg | Freq: Once | ORAL | Status: DC | PRN

## 2015-08-10 MED ORDER — KETAMINE HCL 50 MG/ML IJ SOLN
INTRAMUSCULAR | Status: DC | PRN
  Administered 2015-08-10: 25 mg via INTRAMUSCULAR
  Administered 2015-08-10: 25 mg via INTRAVENOUS

## 2015-08-10 MED ORDER — VANCOMYCIN HCL IN DEXTROSE 1-5 GM/200ML-% IV SOLN
1000.0000 mg | Freq: Once | INTRAVENOUS | Status: DC
  Administered 2015-08-10: 1000 mg via INTRAVENOUS

## 2015-08-10 MED ORDER — FENTANYL CITRATE (PF) 100 MCG/2ML IJ SOLN
INTRAMUSCULAR | Status: DC | PRN
  Administered 2015-08-10 (×4): 50 ug via INTRAVENOUS

## 2015-08-10 MED ORDER — OXYCODONE HCL 5 MG PO TABS
5.0000 mg | ORAL_TABLET | ORAL | Status: DC | PRN
  Administered 2015-08-10 – 2015-08-11 (×4): 5 mg via ORAL
  Filled 2015-08-10 (×4): qty 1

## 2015-08-10 MED ORDER — FOLIC ACID 1 MG PO TABS
1.0000 mg | ORAL_TABLET | Freq: Every day | ORAL | Status: DC
  Administered 2015-08-10 – 2015-08-11 (×2): 1 mg via ORAL
  Filled 2015-08-10 (×2): qty 1

## 2015-08-10 MED ORDER — ALUM & MAG HYDROXIDE-SIMETH 200-200-20 MG/5ML PO SUSP: 30.0000 mL | ORAL | Status: DC | PRN

## 2015-08-10 MED ORDER — MENTHOL 3 MG MT LOZG: 1.0000 | LOZENGE | OROMUCOSAL | Status: DC | PRN

## 2015-08-10 MED ORDER — GLYCOPYRROLATE 0.2 MG/ML IJ SOLN
INTRAMUSCULAR | Status: DC | PRN
Start: 2015-08-10 — End: 2015-08-10
  Administered 2015-08-10: 0.2 mg via INTRAVENOUS

## 2015-08-10 MED ORDER — ACETAMINOPHEN 325 MG PO TABS: 650.0000 mg | ORAL_TABLET | Freq: Four times a day (QID) | ORAL | Status: DC | PRN

## 2015-08-10 MED ORDER — LIDOCAINE HCL (PF) 2 % IJ SOLN
INTRAMUSCULAR | Status: DC | PRN
  Administered 2015-08-10: 50 mg

## 2015-08-10 MED ORDER — ONDANSETRON HCL 4 MG/2ML IJ SOLN
INTRAMUSCULAR | Status: AC
  Administered 2015-08-10: 4 mg
  Filled 2015-08-10: qty 2

## 2015-08-10 MED ORDER — NEOMYCIN-POLYMYXIN B GU 40-200000 IR SOLN
Status: DC | PRN
  Administered 2015-08-10: 2 mL

## 2015-08-10 MED ORDER — PHENYLEPHRINE HCL 10 MG/ML IJ SOLN
INTRAMUSCULAR | Status: DC | PRN
  Administered 2015-08-10 (×2): 100 ug via INTRAVENOUS

## 2015-08-10 MED ORDER — ASPIRIN EC 325 MG PO TBEC
325.0000 mg | DELAYED_RELEASE_TABLET | Freq: Two times a day (BID) | ORAL | Status: DC
  Administered 2015-08-10 – 2015-08-11 (×2): 325 mg via ORAL
  Filled 2015-08-10 (×2): qty 1

## 2015-08-10 MED ORDER — LABETALOL HCL 5 MG/ML IV SOLN
INTRAVENOUS | Status: DC | PRN
  Administered 2015-08-10 (×2): 5 mg via INTRAVENOUS

## 2015-08-10 MED ORDER — FENTANYL CITRATE (PF) 100 MCG/2ML IJ SOLN
INTRAMUSCULAR | Status: AC
  Filled 2015-08-10: qty 2

## 2015-08-10 MED ORDER — BUPIVACAINE HCL (PF) 0.5 % IJ SOLN
INTRAMUSCULAR | Status: AC
  Filled 2015-08-10: qty 30

## 2015-08-10 MED ORDER — LACTATED RINGERS IV SOLN
INTRAVENOUS | Status: DC
  Administered 2015-08-10 (×2): via INTRAVENOUS

## 2015-08-10 MED ORDER — BISACODYL 10 MG RE SUPP: 10.0000 mg | Freq: Every day | RECTAL | Status: DC | PRN

## 2015-08-10 MED ORDER — ONDANSETRON HCL 4 MG PO TABS: 4.0000 mg | ORAL_TABLET | Freq: Four times a day (QID) | ORAL | Status: DC | PRN

## 2015-08-10 MED ORDER — IPRATROPIUM-ALBUTEROL 0.5-2.5 (3) MG/3ML IN SOLN: 3.0000 mL | Freq: Once | RESPIRATORY_TRACT | Status: DC | PRN

## 2015-08-10 MED ORDER — LACTATED RINGERS IV SOLN: INTRAVENOUS | Status: DC

## 2015-08-10 MED ORDER — DEXAMETHASONE SODIUM PHOSPHATE 4 MG/ML IJ SOLN
INTRAMUSCULAR | Status: DC | PRN
  Administered 2015-08-10: 5 mg via INTRAVENOUS

## 2015-08-10 MED ORDER — FENTANYL CITRATE (PF) 100 MCG/2ML IJ SOLN
25.0000 ug | INTRAMUSCULAR | Status: DC | PRN
  Administered 2015-08-10 (×2): 25 ug via INTRAVENOUS
  Administered 2015-08-10: 50 ug via INTRAVENOUS
  Administered 2015-08-10 (×2): 25 ug via INTRAVENOUS

## 2015-08-10 MED ORDER — FAMOTIDINE 20 MG PO TABS
20.0000 mg | ORAL_TABLET | Freq: Once | ORAL | Status: AC
  Administered 2015-08-10: 20 mg via ORAL

## 2015-08-10 MED ORDER — CLONAZEPAM 0.5 MG PO TABS: 0.5000 mg | ORAL_TABLET | Freq: Every day | ORAL | Status: DC | PRN

## 2015-08-10 MED ORDER — VITAMIN B-12 100 MCG PO TABS
100.0000 ug | ORAL_TABLET | Freq: Every day | ORAL | Status: DC
  Administered 2015-08-10 – 2015-08-11 (×2): 100 ug via ORAL
  Filled 2015-08-10 (×2): qty 1

## 2015-08-10 MED ORDER — VANCOMYCIN HCL IN DEXTROSE 1-5 GM/200ML-% IV SOLN
INTRAVENOUS | Status: AC
  Administered 2015-08-10: 1000 mg via INTRAVENOUS
  Filled 2015-08-10: qty 200

## 2015-08-10 MED ORDER — ONDANSETRON HCL 4 MG/2ML IJ SOLN: 4.0000 mg | Freq: Four times a day (QID) | INTRAMUSCULAR | Status: DC | PRN

## 2015-08-10 MED ORDER — MORPHINE SULFATE (PF) 2 MG/ML IV SOLN
2.0000 mg | INTRAVENOUS | Status: DC | PRN
  Administered 2015-08-10 – 2015-08-11 (×5): 2 mg via INTRAVENOUS
  Filled 2015-08-10 (×5): qty 1

## 2015-08-10 MED ORDER — MIDAZOLAM HCL 5 MG/5ML IJ SOLN
INTRAMUSCULAR | Status: DC | PRN
  Administered 2015-08-10: 2 mg via INTRAVENOUS

## 2015-08-10 MED ORDER — MAGNESIUM CITRATE PO SOLN: 1.0000 | Freq: Once | ORAL | Status: DC | PRN

## 2015-08-10 MED ORDER — PROPOFOL 10 MG/ML IV BOLUS
INTRAVENOUS | Status: DC | PRN
  Administered 2015-08-10: 150 mg via INTRAVENOUS

## 2015-08-10 MED ORDER — SODIUM CHLORIDE 0.9 % IV SOLN
75.0000 mL/h | INTRAVENOUS | Status: DC
  Administered 2015-08-10: 75 mL/h via INTRAVENOUS

## 2015-08-10 MED ORDER — DOCUSATE SODIUM 100 MG PO CAPS
100.0000 mg | ORAL_CAPSULE | Freq: Two times a day (BID) | ORAL | Status: DC
  Administered 2015-08-10 – 2015-08-11 (×2): 100 mg via ORAL
  Filled 2015-08-10 (×2): qty 1

## 2015-08-10 MED ORDER — OXYCODONE HCL 5 MG PO TABS: 5.0000 mg | ORAL_TABLET | Freq: Once | ORAL | Status: DC | PRN

## 2015-08-10 MED ORDER — CLINDAMYCIN PHOSPHATE 600 MG/50ML IV SOLN
600.0000 mg | Freq: Four times a day (QID) | INTRAVENOUS | Status: AC
  Administered 2015-08-10 – 2015-08-11 (×2): 600 mg via INTRAVENOUS
  Filled 2015-08-10 (×2): qty 50

## 2015-08-10 MED ORDER — NEOMYCIN-POLYMYXIN B GU 40-200000 IR SOLN
Status: AC
  Filled 2015-08-10: qty 2

## 2015-08-10 MED ORDER — LOSARTAN POTASSIUM 25 MG PO TABS
25.0000 mg | ORAL_TABLET | Freq: Every day | ORAL | Status: DC
  Filled 2015-08-10 (×2): qty 1

## 2015-08-10 MED ORDER — FAMOTIDINE 20 MG PO TABS
ORAL_TABLET | ORAL | Status: AC
  Administered 2015-08-10: 20 mg via ORAL
  Filled 2015-08-10: qty 1

## 2015-08-10 MED ORDER — ACETAMINOPHEN 650 MG RE SUPP: 650.0000 mg | Freq: Four times a day (QID) | RECTAL | Status: DC | PRN

## 2015-08-10 SURGICAL SUPPLY — 63 items
BANDAGE ELASTIC 6 CLIP NS LF (GAUZE/BANDAGES/DRESSINGS) IMPLANT
BLADE SURG SZ10 CARB STEEL (BLADE) ×3 IMPLANT
BNDG COHESIVE 4X5 TAN STRL (GAUZE/BANDAGES/DRESSINGS) ×3 IMPLANT
BNDG ESMARK 6X12 TAN STRL LF (GAUZE/BANDAGES/DRESSINGS) ×3 IMPLANT
BRACE KNEE POST OP SHORT (BRACE) ×3 IMPLANT
BRUSH SCRUB 4% CHG (MISCELLANEOUS) ×3 IMPLANT
CANISTER SUCT 1200ML W/VALVE (MISCELLANEOUS) ×3 IMPLANT
COOLER POLAR GLACIER W/PUMP (MISCELLANEOUS) ×3 IMPLANT
DECANTER SPIKE VIAL GLASS SM (MISCELLANEOUS) ×3 IMPLANT
DRAPE C-ARM XRAY 36X54 (DRAPES) ×3 IMPLANT
DRAPE C-ARMOR (DRAPES) ×3 IMPLANT
DRAPE FLUOR MINI C-ARM 54X84 (DRAPES) ×3 IMPLANT
DRAPE INCISE IOBAN 66X45 STRL (DRAPES) ×3 IMPLANT
DRAPE U-SHAPE 47X51 STRL (DRAPES) ×3 IMPLANT
DURAPREP 26ML APPLICATOR (WOUND CARE) ×9 IMPLANT
GAUZE PETRO XEROFOAM 1X8 (MISCELLANEOUS) ×3 IMPLANT
GAUZE SPONGE 4X4 12PLY STRL (GAUZE/BANDAGES/DRESSINGS) ×3 IMPLANT
GLOVE BIOGEL PI IND STRL 7.0 (GLOVE) ×1 IMPLANT
GLOVE BIOGEL PI IND STRL 8 (GLOVE) ×1 IMPLANT
GLOVE BIOGEL PI IND STRL 9 (GLOVE) ×1 IMPLANT
GLOVE BIOGEL PI INDICATOR 7.0 (GLOVE) ×2
GLOVE BIOGEL PI INDICATOR 8 (GLOVE) ×2
GLOVE BIOGEL PI INDICATOR 9 (GLOVE) ×2
GLOVE SKINSENSE N 8.5 STRL (GLOVE) ×9 IMPLANT
GLOVE SKINSENSE NS SZ6.5 (GLOVE) ×2
GLOVE SKINSENSE NS SZ7.5 (GLOVE) ×2
GLOVE SKINSENSE STRL SZ6.5 (GLOVE) ×1 IMPLANT
GLOVE SKINSENSE STRL SZ7.5 (GLOVE) ×1 IMPLANT
GLOVE SURG 9.0 ORTHO LTXF (GLOVE) IMPLANT
GOWN STRL REUS TWL 2XL XL LVL4 (GOWN DISPOSABLE) ×3 IMPLANT
GOWN STRL REUS W/ TWL LRG LVL3 (GOWN DISPOSABLE) ×2 IMPLANT
GOWN STRL REUS W/TWL LRG LVL3 (GOWN DISPOSABLE) ×4
IMMOB KNEE 14 THIGH 24 706614 (SOFTGOODS) IMPLANT
IV CATH ANGIO 14GX1.88 NO SAFE (IV SOLUTION) IMPLANT
K-WIRE 1.6 (WIRE) ×4
K-WIRE FX5X1.6XNS BN SS (WIRE) ×2
KIT RM TURNOVER STRD PROC AR (KITS) ×3 IMPLANT
KWIRE FX5X1.6XNS BN SS (WIRE) ×2 IMPLANT
NDL SAFETY 18GX1.5 (NEEDLE) ×3 IMPLANT
NEEDLE FILTER BLUNT 18X 1/2SAF (NEEDLE) ×2
NEEDLE FILTER BLUNT 18X1 1/2 (NEEDLE) ×1 IMPLANT
NS IRRIG 1000ML POUR BTL (IV SOLUTION) IMPLANT
PACK EXTREMITY ARMC (MISCELLANEOUS) ×3 IMPLANT
PAD ABD DERMACEA PRESS 5X9 (GAUZE/BANDAGES/DRESSINGS) ×6 IMPLANT
PAD GROUND ADULT SPLIT (MISCELLANEOUS) ×3 IMPLANT
PAD PREP 24X41 OB/GYN DISP (PERSONAL CARE ITEMS) ×3 IMPLANT
PAD WRAPON POLAR KNEE (MISCELLANEOUS) ×1 IMPLANT
PADDING CAST 4IN STRL (MISCELLANEOUS) ×4
PADDING CAST BLEND 4X4 STRL (MISCELLANEOUS) ×2 IMPLANT
RETRIEVER SUT HEWSON (MISCELLANEOUS) ×3 IMPLANT
SPONGE LAP 18X18 5 PK (GAUZE/BANDAGES/DRESSINGS) ×3 IMPLANT
STAPLER SKIN PROX 35W (STAPLE) ×3 IMPLANT
STOCKINETTE BIAS CUT 6 980064 (GAUZE/BANDAGES/DRESSINGS) ×3 IMPLANT
STOCKINETTE IMPERVIOUS 9X36 MD (GAUZE/BANDAGES/DRESSINGS) ×3 IMPLANT
SUT FIBERWIRE #5 38 BLUE (WIRE) IMPLANT
SUT STEEL 7 (SUTURE) IMPLANT
SUT VIC AB 0 CT1 36 (SUTURE) ×3 IMPLANT
SUT VIC AB 2-0 CT1 (SUTURE) ×3 IMPLANT
SUT VIC AB 2-0 CT1 36 (SUTURE) IMPLANT
SYRINGE 10CC LL (SYRINGE) ×3 IMPLANT
WIRE CERLCAGE COILS 1.0 10 (WIRE) ×3 IMPLANT
WIRE Z .062 C-WIRE SPADE TIP (WIRE) ×3 IMPLANT
WRAPON POLAR PAD KNEE (MISCELLANEOUS) ×3

## 2015-08-10 NOTE — Op Note (Signed)
08/10/2015  6:25 PM  PATIENT:  Sydney Coleman  64 y.o. female  PRE-OPERATIVE DIAGNOSIS:  feracture patella right  POST-OPERATIVE DIAGNOSIS:  fractured patella right  PROCEDURE:  Procedure(s): OPEN REDUCTION INTERNAL (ORIF) FIXATION PATELLA TENSION BAND WIRING (Right)  SURGEON:  Surgeon(s) and Role:    * Thornton Park, MD - Primary  ASSISTANTS: Milagros Reap PA student  ANESTHESIA:   general  EBL:  Total I/O In: 900 [I.V.:900] Out: -   LOCAL MEDICATIONS USED:  0.25% MARCAINE  Amount: 30 ml  TOURNIQUET:   Total Tourniquet Time Documented: Thigh (Right) - 102 minutes Total: Thigh (Right) - 102 minutes    PLAN OF CARE: Admit to inpatient   PATIENT DISPOSITION:  PACU - hemodynamically stable.    PREOPERATIVE INDICATIONS:  Sydney Coleman is a  64 y.o. female with a diagnosis of fracture patella right with displacement requiring surgical   I discussed the risks and benefits of surgery. The risks include but are not limited to infection, bleeding requiring blood transfusion, nerve or blood vessel injury, joint stiffness or loss of motion, persistent pain, weakness or instability, malunion, nonunion and hardware failure and the need for further surgery. Medical risks include but are not limited to DVT and pulmonary embolism, myocardial infarction, stroke, pneumonia, respiratory failure and death. Patient understood these risks and wished to proceed.    OPERATIVE IMPLANTS: 0.062 K wires x 3 and 18G malleable wire  OPERATIVE FINDINGS: Comminuted fracture of right patella  OPERATIVE PROCEDURE: Patient was met in the preoperative area with her husband at the bedside. I reviewed the surgical plan for tension band wire fixation of her patella fracture. I reviewed the risks and benefits of surgery. She understood the risks included but are not limited to infection, bleeding, nerve or blood vessel injury, knee stiffness, persistent pain, malunion, nonunion, hardware failure and the need  for further surgery. Medical risks include but are not limited to DVT and pulmonary embolism, myocardial infarction, stroke, pneumonia, respiratory failure and death. Patient understood these resources proceed.  Patient brought to the operating room. She was placed supine on the operative table. She underwent general anesthesia with an LMA.  Patient had a tourniquet applied to the right upper thigh. She was prepped and draped in a sterile fashion. A timeout was performed to verify the patient's name, date of birth, medical record number, correct site of surgery correct procedure to be performed. Was also used to verify the patient received antibiotics appropriate instruments, implants and radiographic studies were available in the room. Once all in attendance were in agreement case began.  A vertical incision was made centered over the patella. Full-thickness skin flaps were developed. The fracture was then identified. The retinaculum however was not torn. The fracture was irrigated. I was gently curetted to remove any interposed soft tissue. A small incision was made to the retinaculum to allow for palpation of the undersurface the patella. This allowed for anatomic alignment of the articular surface. The hemarthrosis was evacuated with suction. The joint was irrigated with GU impregnated saline. A large fracture clamp was then used to hold the fracture reduction. The inferior pole was split into 2 fragments by a second vertical fracture line. Three K wires were then advanced across the transverse mid patella fracture site in an inferior to superior direction.  The fracture reduction and position of the K wires was then confirmed on AP and lateral C-arm imaging.   An 18-gauge malleable wire was then used to create  a figure-of-eight construct and wrapped around both the inferior and superior ends of the vertical K wires. This was then tensioned to allow for formation of a tension band construct. Again the  fracture reduction and hardware position was confirmed on AP and lateral C-arm images. The K wires were then bent kneeling 180 using a needle driver and Frazier suction tip . Excess K wire was then cut with a wire cutter. The bent ends of the K wires were then rotated 180 and malleted into position using a bone tamp into the superior pole of patella. The distal ends of the K wires were then cut with the wire cutter. The figure-of-eight construct reduced all 3 fracture fragments of the patella to a near anatomic position. Final C-arm images were taken. The wound was copiously irrigated.  The retinaculum medially was repaired with #2 Tycron. The figure-of-eight construct was covered with the soft tissue envelope closed with 0 Vicryl. Subcutaneous tissue was closed with an interrupted 2-0 Vicryl and the skin was approximated with staples. All sharp and instrument counts were correct at the conclusion the case. I was scrubbed and present the entire case. I spoke with the patient's family in the postop consultation room at the conclusion the case with them know the patient was stable in the recovery room in the case had been performed without complication.   Timoteo Gaul, MD

## 2015-08-10 NOTE — Transfer of Care (Signed)
Immediate Anesthesia Transfer of Care Note  Patient: MADDI Coleman  Procedure(s) Performed: Procedure(s): OPEN REDUCTION INTERNAL (ORIF) FIXATION PATELLA TENSION BAND WIRING (Right)  Patient Location: PACU  Anesthesia Type:General  Level of Consciousness: awake  Airway & Oxygen Therapy: Patient Spontanous Breathing and Patient connected to face mask oxygen  Post-op Assessment: Report given to RN  Post vital signs: Reviewed  Last Vitals:  Filed Vitals:   08/10/15 1805  BP: 146/80  Pulse: 85  Temp: 35.9 C  Resp: 19    Complications: No apparent anesthesia complications

## 2015-08-10 NOTE — Anesthesia Procedure Notes (Signed)
Procedure Name: LMA Insertion Performed by: Skylan Lara Pre-anesthesia Checklist: Patient identified, Patient being monitored, Timeout performed, Emergency Drugs available and Suction available Patient Re-evaluated:Patient Re-evaluated prior to inductionOxygen Delivery Method: Circle system utilized Preoxygenation: Pre-oxygenation with 100% oxygen Intubation Type: IV induction Ventilation: Mask ventilation without difficulty LMA: LMA inserted LMA Size: 4.0 Tube type: Oral Number of attempts: 1 Placement Confirmation: positive ETCO2 and breath sounds checked- equal and bilateral Tube secured with: Tape Dental Injury: Teeth and Oropharynx as per pre-operative assessment      

## 2015-08-10 NOTE — Progress Notes (Signed)
After Vancomycin started pt began c/o not feeling well- feeling itchy flushed and hot- dr Mack Guise was present antibiotic dcd and cleocin ordered

## 2015-08-10 NOTE — Anesthesia Postprocedure Evaluation (Signed)
  Anesthesia Post-op Note  Patient: Sydney Coleman  Procedure(s) Performed: Procedure(s): OPEN REDUCTION INTERNAL (ORIF) FIXATION PATELLA TENSION BAND WIRING (Right)  Anesthesia type:General LMA  Patient location: PACU  Post pain: Pain level controlled  Post assessment: Post-op Vital signs reviewed, Patient's Cardiovascular Status Stable, Respiratory Function Stable, Patent Airway and No signs of Nausea or vomiting  Post vital signs: Reviewed and stable  Last Vitals:  Filed Vitals:   08/10/15 1905  BP: 160/84  Pulse: 77  Temp: 36.8 C  Resp: 14    Level of consciousness: awake, alert  and patient cooperative  Complications: No apparent anesthesia complications

## 2015-08-10 NOTE — H&P (Addendum)
PREOPERATIVE H&P  Chief Complaint: RIGHT KNEE DISPLACED PATELLA FRACTURE  HPI: Sydney Coleman is a 64 y.o. female who presents with a displaced fracture of the right patella status post fall.   Given the patient's displacement have recommended open reduction internal fixation for a patella fracture.  Past Medical History  Diagnosis Date  . TIA (transient ischemic attack)   . Hypertension   . Chronic headaches   . Anxiety   . SAH (subarachnoid hemorrhage) ([place]) 05/11/2015  . Cancer of vulva ([place])   . Acute spontaneous subarachnoid intracranial hemorrhage ([facility])   . Stroke [facility])     TIA  . Arthritis   . PONV (postoperative nausea and vomiting)   . Difficult intubation     two (2) teeth broken during intubation   Past Surgical History  Procedure Laterality Date  . Thyroidectomy, partial    . Placement of breast implants    . Abdominal hysterectomy    . Tonsillectomy    . Breast surgery    . Eye surgery Right     Cataract Extraction  . Joint replacement Left     Partial Hip Replacement   Social History   Social History  . Marital Status: Married    Spouse Name: N/A  . Number of Children: 3  . Years of Education: HS   Occupational History  . Retired    Social History Main Topics  . Smoking status: Current Every Day Smoker -- 1.00 packs/day    Types: Cigarettes  . Smokeless tobacco: Never Used     Comment: Refuses to tell how much she smokes   . Alcohol Use: No  . Drug Use: No  . Sexual Activity: Not Asked   Other Topics Concern  . None   Social History Narrative   Lives at home with her husband.   Right-handed.   2 cups coffee per day.   Family History  Problem Relation Age of Onset  . Stroke Mother   . Congestive Heart Failure Father   . Heart disease Mother    Allergies  Allergen Reactions  . Amoxil [Amoxicillin] Rash  . Augmentin [Amoxicillin-Pot Clavulanate] Rash  . Other Rash    Elastic  . Vancomycin Rash   Prior to Admission medications    Medication Sig Start Date End Date Taking? Authorizing Provider  clonazePAM (KLONOPIN) 0.5 MG tablet Take 0.5 mg by mouth daily as needed for anxiety.    Yes Historical Provider, MD  cyanocobalamin 100 MCG tablet Take 100 mcg by mouth daily.   Yes Historical Provider, MD  FOLIC ACID PO Take 1 tablet by mouth daily.    Yes Historical Provider, MD  HYDROcodone-acetaminophen (NORCO/VICODIN) 5-325 MG tablet Take 1 tablet by mouth every 4 (four) hours as needed. 08/01/15  Yes Dorie Rank, MD  losartan (COZAAR) 25 MG tablet Take 25 mg by mouth daily.    Yes Historical Provider, MD  pravastatin (PRAVACHOL) 20 MG tablet Take 20 mg by mouth daily.   Yes Historical Provider, MD  aspirin 81 MG tablet Take 81 mg by mouth every morning.    Historical Provider, MD     Positive ROS: All other systems have been reviewed and were otherwise negative with the exception of those mentioned in the HPI and as above.  Physical Exam: General: Alert, no acute distress Cardiovascular: Regular rate and rhythm, no murmurs rubs or gallops.  No pedal edema Respiratory: Clear to auscultation bilaterally, no wheezes rales or rhonchi. No cyanosis, no use of accessory  musculature GI: No organomegaly, abdomen is soft and non-tender nondistended with positive bowel sounds. Skin: Skin intact, no lesions within the operative field. Neurologic: Sensation intact distally Psychiatric: Patient is competent for consent with normal mood and affect Lymphatic: No cervical lymphadenopathy  MUSCULOSKELETAL: Right lower extremity: Patient has a moderate right hemarthrosis. Superficial skin abrasions over the anterior knee are are scabbed over. Patient has palpable pedal pulses. She can flex and extend her toes and dorsiflex and plantarflex her ankle. She has intact sensation light touch but does state that she has had intermittent numbness and tingling in the right foot since her injury.  Assessment: RIGHT KNEE PATELLA  FRACTURE  Plan: Plan for Procedure(s): OPEN REDUCTION INTERNAL (ORIF) FIXATION PATELLA  I reviewed the details of the operation with the patient and her husband. I have also explained the postoperative recovery. I answered all questions. I recommended fixation with open reduction and wire fixation. Patient will be admitted overnight for IV antibiotics, pain control and neurovascular monitoring.  I discussed the risks and benefits of surgery. The risks include but are not limited to infection, bleeding requiring blood transfusion, nerve or blood vessel injury, joint stiffness or loss of motion, persistent pain, weakness or instability, malunion, nonunion and hardware failure and the need for further surgery. Medical risks include but are not limited to DVT and pulmonary embolism, myocardial infarction, stroke, pneumonia, respiratory failure and death. Patient understood these risks and wished to proceed.   Thornton Park, MD   08/10/2015 3:17 PM

## 2015-08-10 NOTE — Anesthesia Preprocedure Evaluation (Addendum)
Anesthesia Evaluation  Patient identified by MRN, date of birth, ID band Patient awake    Reviewed: Allergy & Precautions, H&P , NPO status , Patient's Chart, lab work & pertinent test results  History of Anesthesia Complications (+) PONV, DIFFICULT AIRWAY and history of anesthetic complications  Airway Mallampati: III  TM Distance: >3 FB Neck ROM: limited    Dental  (+) Edentulous Upper, Edentulous Lower, Upper Dentures, Lower Dentures   Pulmonary neg shortness of breath, Current Smoker,    Pulmonary exam normal breath sounds clear to auscultation       Cardiovascular Exercise Tolerance: Good hypertension, (-) angina(-) Past MI and (-) DOE Normal cardiovascular exam Rhythm:regular Rate:Normal     Neuro/Psych  Headaches, TIACVA negative psych ROS   GI/Hepatic negative GI ROS, Neg liver ROS,   Endo/Other  negative endocrine ROS  Renal/GU negative Renal ROS  negative genitourinary   Musculoskeletal  (+) Arthritis ,   Abdominal   Peds  Hematology negative hematology ROS (+)   Anesthesia Other Findings Past Medical History:   TIA (transient ischemic attack)                              Hypertension                                                 Chronic headaches                                            Anxiety                                                      SAH (subarachnoid hemorrhage) ([place])             05/11/2015   Cancer of vulva ([place])                                        Acute spontaneous subarachnoid intracranial he*              Stroke ([place])                                                   Comment:TIA   Arthritis                                                    PONV (postoperative nausea and vomiting)                     Difficult intubation  Comment:two (2) teeth broken during intubation  Past Surgical History:   THYROIDECTOMY, PARTIAL                                         PLACEMENT OF BREAST IMPLANTS                                  ABDOMINAL HYSTERECTOMY                                        TONSILLECTOMY                                                 BREAST SURGERY                                                EYE SURGERY                                     Right                Comment:Cataract Extraction   JOINT REPLACEMENT                               Left                Comment:Partial Hip Replacement  BMI    Body Mass Index   23.74 kg/m 2      Reproductive/Obstetrics negative OB ROS                             Anesthesia Physical Anesthesia Plan  ASA: III  Anesthesia Plan: General LMA   Post-op Pain Management:    Induction:   Airway Management Planned:   Additional Equipment:   Intra-op Plan:   Post-operative Plan:   Informed Consent: I have reviewed the patients History and Physical, chart, labs and discussed the procedure including the risks, benefits and alternatives for the proposed anesthesia with the patient or authorized representative who has indicated his/her understanding and acceptance.   Dental Advisory Given  Plan Discussed with: Anesthesiologist, CRNA and Surgeon  Anesthesia Plan Comments: (Patient informed that they are higher risk for complications from anesthesia during this procedure due to their medical history.  Patient voiced understanding.)        Anesthesia Quick Evaluation

## 2015-08-11 ENCOUNTER — Encounter: Payer: Self-pay | Admitting: Orthopedic Surgery

## 2015-08-11 LAB — CBC
HCT: 37.1 % (ref 35.0–47.0)
HEMOGLOBIN: 12.9 g/dL (ref 12.0–16.0)
MCH: 34.7 pg — AB (ref 26.0–34.0)
MCHC: 34.8 g/dL (ref 32.0–36.0)
MCV: 99.8 fL (ref 80.0–100.0)
Platelets: 249 10*3/uL (ref 150–440)
RBC: 3.71 MIL/uL — ABNORMAL LOW (ref 3.80–5.20)
RDW: 12.8 % (ref 11.5–14.5)
WBC: 10.3 10*3/uL (ref 3.6–11.0)

## 2015-08-11 LAB — BASIC METABOLIC PANEL
ANION GAP: 8 (ref 5–15)
BUN: 9 mg/dL (ref 6–20)
CHLORIDE: 103 mmol/L (ref 101–111)
CO2: 27 mmol/L (ref 22–32)
Calcium: 8.7 mg/dL — ABNORMAL LOW (ref 8.9–10.3)
Creatinine, Ser: 0.69 mg/dL (ref 0.44–1.00)
GFR calc Af Amer: >60 mL/min (ref >60)
GLUCOSE: 108 mg/dL — AB (ref 65–99)
POTASSIUM: 4 mmol/L (ref 3.5–5.1)
Sodium: 138 mmol/L (ref 135–145)

## 2015-08-11 MED ORDER — ASPIRIN 325 MG PO TBEC: 325.0000 mg | DELAYED_RELEASE_TABLET | Freq: Two times a day (BID) | ORAL | Status: DC

## 2015-08-11 MED ORDER — OXYCODONE HCL 5 MG PO TABS: 5.0000 mg | ORAL_TABLET | ORAL | Status: DC | PRN

## 2015-08-11 MED ORDER — DOCUSATE SODIUM 100 MG PO CAPS: 100.0000 mg | ORAL_CAPSULE | Freq: Two times a day (BID) | ORAL | Status: DC

## 2015-08-11 MED ORDER — OXYCODONE HCL 5 MG PO TABS
5.0000 mg | ORAL_TABLET | ORAL | Status: DC | PRN
Start: 2015-08-11 — End: 2015-10-25

## 2015-08-11 NOTE — Care Management (Signed)
   Patient suffers from right patella fracture  which impairs their ability to perform daily activities like toileting, feeding, dressing, grooming, bathing in the home. A cane, walker, crutch will not resolve issue with performing activities of daily living. A wheelchair will allow patient to safely perform daily activities. Patient can safely propel the wheelchair in the home or has a caregiver who can provide assistance.

## 2015-08-11 NOTE — Clinical Social Work Note (Signed)
Clinical Social Work Assessment  Patient Details  Name: Sydney Coleman MRN: [id number] Date of Birth: Aug 28, 1951  Date of referral:  08/11/15               Reason for consult:  Facility Placement, Other (Comment Required) (Patient refused SNF)                Permission sought to share information with:  Case Manager Permission granted to share information::  Yes, Verbal Permission Granted  Name::        Agency::     Relationship::     Contact Information:     Housing/Transportation Living arrangements for the past 2 months:  Single Family Home Source of Information:  Patient, Spouse Patient Interpreter Needed:  None Criminal Activity/Legal Involvement Pertinent to Current Situation/Hospitalization:  No - Comment as needed Significant Relationships:  Spouse Lives with:  Spouse Do you feel safe going back to the place where you live?  Yes Need for family participation in patient care:  Yes (Comment)  Care giving concerns:  Patient lives in [name] with her husband [name].    Social Worker assessment / plan: Holiday representative (CSW) received verbal consult that PT is recommending SNF. CSW met with patient and her husband [name] was at bedside. Patient was alert and oriented and sitting in the chair. CSW introduced self and explained role of CSW department. Patient reported that she lives in [place] with her husband and is going home today. CSW explained that PT is recommending SNF. Patient reported that she has been to rehab before and is going home this time. Husband reported that he watched patient's PT session and feels comfortable with her going home. MD is in agreement with patient going home. Patient reported that she has all the equipment she needs at home. RN Case Manager aware of above. Please reconsult if future social work needs arise. CSW signing off.    Employment status:  Retired Nurse, adult PT Recommendations:  [place] (Patient  refused SNF ) Information / Referral to community resources:  Other (Comment Required) (Home Health )  Patient/Family's Response to care:  Patient refused SNF and is going home with home health.   Patient/Family's Understanding of and Emotional Response to Diagnosis, Current Treatment, and Prognosis: Patient and husband were pleasant throughout assessment and thanked CSW for visit.   Emotional Assessment Appearance:  Appears stated age Attitude/Demeanor/Rapport:    Affect (typically observed):  Accepting, Adaptable, Pleasant Orientation:  Oriented to Self, Oriented to Place, Oriented to  Time, Oriented to Situation Alcohol / Substance use:  Not Applicable Psych involvement (Current and /or in the community):  No (Comment)  Discharge Needs  Concerns to be addressed:  No discharge needs identified Readmission within the last 30 days:  No Current discharge risk:  None Barriers to Discharge:  No Barriers Identified   Loralyn Freshwater, LCSW 08/11/2015, 1:16 PM

## 2015-08-11 NOTE — Discharge Instructions (Signed)
Patient be discharged home with home health PT. She will continue wearing her right knee brace at all times locked in extension. She is to avoid any right knee flexion. Patient to continue to elevate her right knee and his Polar Care to reduce swelling. She is partial weightbearing on the right lower extremity in her brace with a walker. Patient will continue enteric coated aspirin 325 mg by mouth twice a day for DVT prophylaxis 6 weeks. She may shower but keep the bandage and splint dry with a plastic bag.

## 2015-08-11 NOTE — Care Management Note (Signed)
Case Management Note  Patient Details  Name: NARELY NOBLES MRN: [id number] Date of Birth: 24-Jul-1951  Subjective/Objective:                   Met with patient and her husband to discuss discharge planning. Husband wants patient to have HHPT and patient agrees. [facility]. Patient refused SNF. She has a bedside commode and rolling walker available at home. She uses [facility]  [phone] for Rx. She is not on Lovenox but is on Aspirin. She said she was going home today.   Action/Plan: List of home health agencies provided. Referral called to [facility] with [place] care. RNCM will continue to follow.   Expected Discharge Date:                  Expected Discharge Plan:     In-House Referral:  Clinical Social Work  Discharge planning Services  CM Consult  Post Acute Care Choice:  Home Health Choice offered to:  Patient  DME Arranged:    DME Agency:     HH Arranged:  PT [place]:  [place]  Status of Service:  In process, will continue to follow  Medicare Important Message Given:    Date Medicare IM Given:    Medicare IM give by:    Date Additional Medicare IM Given:    Additional Medicare Important Message give by:     If discussed at [place] of Stay Meetings, dates discussed:    Additional Comments:  Marshell Garfinkel, RN 08/11/2015, 11:37 AM

## 2015-08-11 NOTE — Progress Notes (Signed)
DISCHARGE NOTE:  Pts VSS, discharge instructions and prescriptions given to pt. Pt verbalized understanding. Pt wheeled to car.

## 2015-08-11 NOTE — Evaluation (Signed)
Physical Therapy Evaluation Patient Details Name: Sydney Coleman MRN: [id number] DOB: 01-04-1951 Today's Date: 08/11/2015   History of Present Illness  Pt was admitted to the hospital s/p R patella fracture and ORIF on 08/10/15  Clinical Impression  Pt presents with hx of TIA, HTN, cancer, chronic headaches, and anxiety. Examination reveals that pt performs all mobility with greatly reduced speed and pain that builds the longer that she is on her feet, forcing her to sit unexpectedly unless cued strongly to remain standing. Pt performed bed mobility at min assist, transfers at min assist, and ambulation at mod assist. Pt needs close assist and/or chair follow. She fatigued after only 4 ft of ambulation and was greatly limited by pain. Pt would most benefit from short term rehabilitation for safety, however if she chooses to go home she will need HHPT and close supervision with all mobility. Due to her ROM, strength and gait deficits she will continue to benefit from skilled PT in order to return home safely.     Follow Up Recommendations SNF    Equipment Recommendations  None recommended by PT    Recommendations for Other Services       Precautions / Restrictions Precautions Precautions: Knee;Fall (Patella) Precaution Booklet Issued: No Required Braces or Orthoses:  (Knee brace - hinged (locked)) Restrictions Weight Bearing Restrictions: Yes RLE Weight Bearing: Partial weight bearing RLE Partial Weight Bearing Percentage or Pounds: 50 Other Position/Activity Restrictions: knee flexion/PROM      Mobility  Bed Mobility Overal bed mobility: Needs Assistance Bed Mobility: Supine to Sit     Supine to sit: Min assist     General bed mobility comments: Pt needs assist for LEs but is able to use her hands on bed rail to pull herself into sitting  Transfers Overall transfer level: Needs assistance Equipment used: Rolling walker (2 wheeled) Transfers: Sit to/from Stand Sit to  Stand: Min assist         General transfer comment: Pt needs assist to get up into standing but shows good strength in LLE assisting her up. Needs cues for hand placement with transfers  Ambulation/Gait Ambulation/Gait assistance: Mod assist Ambulation Distance (Feet): 4 Feet Assistive device: Rolling walker (2 wheeled) Gait Pattern/deviations: Step-to pattern;Decreased step length - right;Decreased step length - left;Decreased stride length;Antalgic Gait velocity: decreased Gait velocity interpretation: Below normal speed for age/gender General Gait Details: Pt needs assist for direction of RW. She also requires weight shifting to effectively advance bilat LEs. She needs cues for use of hands on RW in order to maintain PWB. Pt fatigues after 4 ft of ambulation and needs to be sat down and her leg propped out prior to sitting  Stairs            Wheelchair Mobility    Modified Rankin (Stroke Patients Only)       Balance Overall balance assessment: No apparent balance deficits (not formally assessed)                                           Pertinent Vitals/Pain Pain Assessment: 0-10 Pain Score: 8  Pain Location: R knee Pain Intervention(s): Limited activity within patient's tolerance;Monitored during session;Premedicated before session;Ice applied    Home Living Family/patient expects to be discharged to:: Private residence Living Arrangements: Spouse/significant other Available Help at Discharge: Family;Available 24 hours/day Type of Home: House Home Access: Level entry  Home Layout: One level Home Equipment: Walker - 2 wheels;Walker - 4 wheels;Bedside commode;Wheelchair - manual      Prior Function Level of Independence: Independent         Comments: Pt was indep with ambulation and ADLs.      Hand Dominance        Extremity/Trunk Assessment   Upper Extremity Assessment: Overall WFL for tasks assessed           Lower  Extremity Assessment: RLE deficits/detail RLE Deficits / Details: Gross MMT 2-/5 RLE       Communication   Communication: No difficulties  Cognition Arousal/Alertness: Awake/alert Behavior During Therapy: WFL for tasks assessed/performed Overall Cognitive Status: Within Functional Limits for tasks assessed                      General Comments      Exercises Other Exercises Other Exercises: Pt performed bilateral therex x 10 reps at min A for facilitation of movement. Exercises included: ankle pumps, quad sets, glute sets, SLR, hip abd      Assessment/Plan    PT Assessment Patient needs continued PT services  PT Diagnosis Difficulty walking;Abnormality of gait;Acute pain   PT Problem List Decreased strength;Decreased range of motion;Decreased mobility;Decreased knowledge of use of DME;Decreased activity tolerance;Pain  PT Treatment Interventions DME instruction;Gait training;Stair training;Functional mobility training;Therapeutic activities;Therapeutic exercise;Balance training;Neuromuscular re-education   PT Goals (Current goals can be found in the Care Plan section) Acute Rehab PT Goals Patient Stated Goal: none stated PT Goal Formulation: With patient Time For Goal Achievement: 09/01/15 Potential to Achieve Goals: Good    Frequency BID   Barriers to discharge        Co-evaluation               End of Session Equipment Utilized During Treatment: Gait belt Activity Tolerance: Patient tolerated treatment well Patient left: in chair;with call bell/phone within reach;with chair alarm set;with SCD's reapplied;with family/visitor present Nurse Communication: Mobility status         Time: 1020-1045 PT Time Calculation (min) (ACUTE ONLY): 25 min   Charges:         PT G CodesJanyth Contes 08-30-2015, 1:15 PM  Janyth Contes, SPT. [phone]

## 2015-08-11 NOTE — Progress Notes (Signed)
Physical Therapy Treatment Patient Details Name: Sydney Coleman MRN: [id number] DOB: 06-Mar-1951 Today's Date: 08/11/2015    History of Present Illness Pt was admitted to the hospital s/p R patella fracture and ORIF on 08/10/15    PT Comments    Pt able to perform ambulation with somewhat more ease this afternoon, however she is still relatively limited and was educated on how to safely perform mobility in the house (needs assist and needs readily available sitting option). She was also strongly encouraged to participate in [place], as she expressed lacking confidence in [place] in being able to help her due to "her mother's prior experience with HHPT". By her choosing to return home, the importance of early PT was stressed to her for the sake of recovering her function - pt seemed to be in agreement with this. She is very pleasant to work with and willing to participate in therapy tasks. Secondary to her strength, ROM, and gait deficits, she will continue to benefit from skilled PT in order for her to return to optimal PLOF.   Follow Up Recommendations  SNF (If refusing SNF; HHTP with supervision for all mobility)     Equipment Recommendations  None recommended by PT    Recommendations for Other Services       Precautions / Restrictions Precautions Precautions: Knee;Fall (avoid flexion ) Precaution Booklet Issued: No Required Braces or Orthoses:  (Knee brace - locked in extension ) Restrictions Weight Bearing Restrictions: Yes RLE Weight Bearing: Partial weight bearing RLE Partial Weight Bearing Percentage or Pounds: 50 Other Position/Activity Restrictions: knee flexion/PROM    Mobility  Bed Mobility               General bed mobility comments: received in recliner; not assessed   Transfers Overall transfer level: Needs assistance Equipment used: Rolling walker (2 wheeled) Transfers: Sit to/from Stand Sit to Stand: Min assist         General transfer comment: Pt needs  assist to get up into standing but shows good strength in LLE assisting her up. Needs cues for hand placement with transfers  Ambulation/Gait Ambulation/Gait assistance: Min assist Ambulation Distance (Feet): 10 Feet (F/B) Assistive device: Rolling walker (2 wheeled) Gait Pattern/deviations: Step-to pattern;Shuffle;Decreased step length - right;Decreased step length - left;Decreased stride length Gait velocity: decreased Gait velocity interpretation: Below normal speed for age/gender General Gait Details: Pt still fatigues and is limited by pain with ambulation. She needs cues for use of RW and has been encouraged that her husband (or somebody appropriate) needs to assist her with all mobility once at home, and to always have a sitting option quickly available with ambulation.    Stairs            Wheelchair Mobility    Modified Rankin (Stroke Patients Only)       Balance Overall balance assessment: History of Falls                                  Cognition Arousal/Alertness: Awake/alert Behavior During Therapy: WFL for tasks assessed/performed Overall Cognitive Status: Within Functional Limits for tasks assessed                      Exercises      General Comments        Pertinent Vitals/Pain Pain Assessment: 0-10 Pain Score: 6  Pain Location: R knee Pain Intervention(s): Limited activity within patient's tolerance;Monitored  during session;Premedicated before session;Ice applied    Home Living                      Prior Function            PT Goals (current goals can now be found in the care plan section) Acute Rehab PT Goals Patient Stated Goal: to walk before leaving PT Goal Formulation: With patient Time For Goal Achievement: 09/01/15 Potential to Achieve Goals: Good Progress towards PT goals: Progressing toward goals    Frequency  BID    PT Plan Current plan remains appropriate    Co-evaluation              End of Session Equipment Utilized During Treatment: Gait belt Activity Tolerance: Patient tolerated treatment well Patient left: in chair;with call bell/phone within reach;with chair alarm set;with SCD's reapplied;with family/visitor present     Time: 8828-0034 PT Time Calculation (min) (ACUTE ONLY): 10 min  Charges:                       G CodesJanyth Contes 2015/09/01, 4:45 PM  Janyth Contes, SPT. [phone]

## 2015-08-11 NOTE — Progress Notes (Signed)
Subjective:  Patient is postop day 1 status post open reduction and fixation right patella fracture. Patient reports pain as moderate.  Patient is up out of bed to a chair. Her husband is at the bedside. Patient overall is doing well. She is alert oriented and answering questions and following commands.  Objective:   VITALS:   Filed Vitals:   08/11/15 0051 08/11/15 0405 08/11/15 0722 08/11/15 0918  BP: 129/58 127/57 111/57 113/62  Pulse: 65 74 67   Temp: 98.6 F (37 C) 98.6 F (37 C) 98.2 F (36.8 C)   TempSrc: Oral Oral Oral   Resp: 18 18 16    Height:      Weight:      SpO2: 100% 100% 100%     PHYSICAL EXAM:  Right lower extremity: Patient's dressing is clean dry and intact. Polar Care sleeve is in place. She has a hinge knee brace in place over the right knee. Patient has palpable pedal pulses. She has intact sensation light touch and can flex and extend her toes and dorsiflex and plantarflex her ankle.   LABS  Results for orders placed or performed during the hospital encounter of 08/10/15 (from the past 24 hour(s))  CBC     Status: Abnormal   Collection Time: 08/11/15  4:49 AM  Result Value Ref Range   WBC 10.3 3.6 - 11.0 K/uL   RBC 3.71 (L) 3.80 - 5.20 MIL/uL   Hemoglobin 12.9 12.0 - 16.0 g/dL   HCT 37.1 35.0 - 47.0 %   MCV 99.8 80.0 - 100.0 fL   MCH 34.7 (H) 26.0 - 34.0 pg   MCHC 34.8 32.0 - 36.0 g/dL   RDW 12.8 11.5 - 14.5 %   Platelets 249 150 - 440 K/uL  Basic metabolic panel     Status: Abnormal   Collection Time: 08/11/15  4:49 AM  Result Value Ref Range   Sodium 138 135 - 145 mmol/L   Potassium 4.0 3.5 - 5.1 mmol/L   Chloride 103 101 - 111 mmol/L   CO2 27 22 - 32 mmol/L   Glucose, Bld 108 (H) 65 - 99 mg/dL   BUN 9 6 - 20 mg/dL   Creatinine, Ser 0.69 0.44 - 1.00 mg/dL   Calcium 8.7 (L) 8.9 - 10.3 mg/dL   GFR calc non Af Amer >60 >60 mL/min   GFR calc Af Amer >60 >60 mL/min   Anion gap 8 5 - 15    Dg Chest 2 View  08/09/2015  CLINICAL DATA:   Preop for knee surgery. EXAM: CHEST  2 VIEW COMPARISON:  May 12, 2015. FINDINGS: The heart size and mediastinal contours are within normal limits. Both lungs are clear. The visualized skeletal structures are unremarkable. IMPRESSION: No active cardiopulmonary disease. Electronically Signed   By: Marijo Conception, M.D.   On: 08/09/2015 15:04   Dg Knee 1-2 Views Right  08/10/2015  CLINICAL DATA:  64 year old female with history of patellar fracture status post repair. EXAM: RIGHT KNEE - 1-2 VIEW COMPARISON:  08/10/2015. FINDINGS: Postoperative changes of ORIF for transverse patellar fracture are noted, with 3 K-wires and a cerclage wire in place, with restoration of normal anatomic alignment. There is a small amount of gas in the joint space. Overlying soft tissues appear mildly swollen, and there are anterior skin staples present. Distal femur, proximal tibia and proximal fibula all appear intact. IMPRESSION: 1. Postoperative changes from ORIF for patellar fracture, as above. Electronically Signed   By: Vinnie Langton  M.D.   On: 08/10/2015 18:51   Dg C-arm 61-120 Min  08/10/2015  CLINICAL DATA:  Patellar ORIF EXAM: RIGHT KNEE - 3 VIEW; DG C-ARM 61-120 MIN COMPARISON:  08/01/2015 : Radiation Exposure Index (as provided by the fluoroscopic device): Cumulative area dose product:  9.40 cGycm2 Cumulative air kerma:  0.62 mGy Number of Acquired Images:  2 FINDINGS: Cerclage wire and 3 K-wires have been placed across a reduced transverse mid patellar fracture. Bones appear demineralized. Femorotibial alignment appears normal. IMPRESSION: Post ORIF of a transverse mid patellar fracture. Electronically Signed   By: Lavonia Dana M.D.   On: 08/10/2015 17:31   Dg Knee 2 Views Right  08/10/2015  CLINICAL DATA:  Patellar ORIF EXAM: RIGHT KNEE - 3 VIEW; DG C-ARM 61-120 MIN COMPARISON:  08/01/2015 : Radiation Exposure Index (as provided by the fluoroscopic device): Cumulative area dose product:  9.40 cGycm2 Cumulative  air kerma:  0.62 mGy Number of Acquired Images:  2 FINDINGS: Cerclage wire and 3 K-wires have been placed across a reduced transverse mid patellar fracture. Bones appear demineralized. Femorotibial alignment appears normal. IMPRESSION: Post ORIF of a transverse mid patellar fracture. Electronically Signed   By: Lavonia Dana M.D.   On: 08/10/2015 17:31    Assessment/Plan: 1 Day Post-Op   Active Problems:   Patella fracture  Patient well postop. Patient will be discharged home today. The patient feels she is ready to go home. She states the oral narcotics are controlling her pain. She has performed physical therapy. She is set up for home health PT. She'll leave the dressing on until she follows up with me in 7-10 days. Continue taking enteric coated aspirin 325 mg by mouth twice a day for DVT prophylaxis. Patient must wear right knee brace locked in extension until follow-up.    [place] , MD 08/11/2015, 12:54 PM

## 2015-08-11 NOTE — Care Management (Signed)
HHPT can do wound assessment. I have cancelled [place] as [facility] does not include skilled nursing.

## 2015-08-11 NOTE — Discharge Summary (Signed)
Physician Discharge Summary  Patient ID: Sydney Coleman MRN: [id number] DOB/AGE: [birth year] 64 y.o.  Admit date: 08/10/2015 Discharge date: 08/11/2015  Admission Diagnoses:  fracture patella right  Discharge Diagnoses:  fracture patella right, status post ORIF Active Problems:   Patella fracture   Past Medical History  Diagnosis Date  . TIA (transient ischemic attack)   . Hypertension   . Chronic headaches   . Anxiety   . SAH (subarachnoid hemorrhage) ([place]) 05/11/2015  . Cancer of vulva ([place])   . Acute spontaneous subarachnoid intracranial hemorrhage ([facility])   . Stroke [facility])     TIA  . Arthritis   . PONV (postoperative nausea and vomiting)   . Difficult intubation     two (2) teeth broken during intubation    Surgeries: Procedure(s): OPEN REDUCTION INTERNAL (ORIF) FIXATION PATELLA TENSION BAND WIRING on 08/10/2015   Consultants (if any):    Discharged Condition: Improved  Hospital Course: Sydney Coleman is an 64 y.o. female who was admitted 08/10/2015 with a diagnosis of a displaced right patella and went to the operating room on 08/10/2015 for an open reduction internal fixation.  Patient was medically postoperatively for pain control, neurovascular monitoring and postop IV antibiotics.  She was given perioperative antibiotics:  Anti-infectives    Start     Dose/Rate Route Frequency Ordered Stop   08/10/15 2200  clindamycin (CLEOCIN) IVPB 600 mg     600 mg 100 mL/hr over 30 Minutes Intravenous Every 6 hours 08/10/15 1926 08/11/15 0453   08/10/15 1523  clindamycin (CLEOCIN) 600 MG/50ML IVPB    Comments:  Hallaji, Violet Ann: cabinet override      08/10/15 1523 08/10/15 1557   08/10/15 1515  clindamycin (CLEOCIN) IVPB 600 mg  Status:  Discontinued     600 mg 100 mL/hr over 30 Minutes Intravenous  Once 08/10/15 1513 08/10/15 1919   08/10/15 0145  vancomycin (VANCOCIN) IVPB 1000 mg/200 mL premix  Status:  Discontinued     1,000 mg 200 mL/hr over 60 Minutes Intravenous   Once 08/10/15 0140 08/10/15 1513    .  She was given sequential compression devices, TED stockings, and ECASA for DVT prophylaxis.  She benefited maximally from the hospital stay and there were no complications.  She was value by physical therapy on postop day #1. Her pain was controlled on oral narcotics. Given her clinical improvement patient is prepared for discharge home.  Recent vital signs:  Filed Vitals:   08/11/15 0918  BP: 113/62  Pulse:   Temp:   Resp:     Recent laboratory studies:  Lab Results  Component Value Date   HGB 12.9 08/11/2015   HGB 13.7 08/05/2015   HGB 15.7 05/11/2015   Lab Results  Component Value Date   WBC 10.3 08/11/2015   PLT 249 08/11/2015   Lab Results  Component Value Date   INR 0.92 08/09/2015   Lab Results  Component Value Date   NA 138 08/11/2015   K 4.0 08/11/2015   CL 103 08/11/2015   CO2 27 08/11/2015   BUN 9 08/11/2015   CREATININE 0.69 08/11/2015   GLUCOSE 108* 08/11/2015    Discharge Medications:     Medication List    ASK your doctor about these medications        aspirin 81 MG tablet  Take 81 mg by mouth every morning.     clonazePAM 0.5 MG tablet  Commonly known as:  KLONOPIN  Take 0.5 mg by mouth daily as  needed for anxiety.     cyanocobalamin 100 MCG tablet  Take 100 mcg by mouth daily.     FOLIC ACID PO  Take 1 tablet by mouth daily.     HYDROcodone-acetaminophen 5-325 MG tablet  Commonly known as:  NORCO/VICODIN  Take 1 tablet by mouth every 4 (four) hours as needed.     losartan 25 MG tablet  Commonly known as:  COZAAR  Take 25 mg by mouth daily.     pravastatin 20 MG tablet  Commonly known as:  PRAVACHOL  Take 20 mg by mouth daily.        Diagnostic Studies: Dg Chest 2 View  08/09/2015  CLINICAL DATA:  Preop for knee surgery. EXAM: CHEST  2 VIEW COMPARISON:  May 12, 2015. FINDINGS: The heart size and mediastinal contours are within normal limits. Both lungs are clear. The visualized  skeletal structures are unremarkable. IMPRESSION: No active cardiopulmonary disease. Electronically Signed   By: Marijo Conception, M.D.   On: 08/09/2015 15:04   Dg Knee 1-2 Views Right  08/10/2015  CLINICAL DATA:  64 year old female with history of patellar fracture status post repair. EXAM: RIGHT KNEE - 1-2 VIEW COMPARISON:  08/10/2015. FINDINGS: Postoperative changes of ORIF for transverse patellar fracture are noted, with 3 K-wires and a cerclage wire in place, with restoration of normal anatomic alignment. There is a small amount of gas in the joint space. Overlying soft tissues appear mildly swollen, and there are anterior skin staples present. Distal femur, proximal tibia and proximal fibula all appear intact. IMPRESSION: 1. Postoperative changes from ORIF for patellar fracture, as above. Electronically Signed   By: Vinnie Langton M.D.   On: 08/10/2015 18:51   Dg Knee Complete 4 Views Right  08/01/2015  CLINICAL DATA:  Pt tripped with a flip flop shoe when it was caught on the sidewalk outside, right knee pain swelling, anterior abrasion to anterior right knee EXAM: RIGHT KNEE - COMPLETE 4+ VIEW COMPARISON:  None. FINDINGS: Mild narrowing of the medial compartment. There is a comminuted fracture through the central patella. Fracture fragments are mildly displaced. There is a moderate joint effusion and there is prepatellar soft tissue swelling. Fracture line is primarily horizontal in orientation with 2 dominant fracture components in several small satellite fragments. IMPRESSION: Fracture of the patella Electronically Signed   By: Skipper Cliche M.D.   On: 08/01/2015 14:45   Dg C-arm 61-120 Min  08/10/2015  CLINICAL DATA:  Patellar ORIF EXAM: RIGHT KNEE - 3 VIEW; DG C-ARM 61-120 MIN COMPARISON:  08/01/2015 : Radiation Exposure Index (as provided by the fluoroscopic device): Cumulative area dose product:  9.40 cGycm2 Cumulative air kerma:  0.62 mGy Number of Acquired Images:  2 FINDINGS: Cerclage  wire and 3 K-wires have been placed across a reduced transverse mid patellar fracture. Bones appear demineralized. Femorotibial alignment appears normal. IMPRESSION: Post ORIF of a transverse mid patellar fracture. Electronically Signed   By: Lavonia Dana M.D.   On: 08/10/2015 17:31   Dg Knee 2 Views Right  08/10/2015  CLINICAL DATA:  Patellar ORIF EXAM: RIGHT KNEE - 3 VIEW; DG C-ARM 61-120 MIN COMPARISON:  08/01/2015 : Radiation Exposure Index (as provided by the fluoroscopic device): Cumulative area dose product:  9.40 cGycm2 Cumulative air kerma:  0.62 mGy Number of Acquired Images:  2 FINDINGS: Cerclage wire and 3 K-wires have been placed across a reduced transverse mid patellar fracture. Bones appear demineralized. Femorotibial alignment appears normal. IMPRESSION: Post ORIF of a transverse  mid patellar fracture. Electronically Signed   By: Lavonia Dana M.D.   On: 08/10/2015 17:31    Disposition: 01-Home or Self Care   Patient be discharged home with home health PT. She will continue wearing her right knee brace at all times locked in extension. She is to avoid any right knee flexion. Patient to continue to elevate her right knee and his Polar Care to reduce swelling. She is partial weightbearing on the right lower extremity in her brace with a walker. Patient will continue enteric coated aspirin 325 mg by mouth twice a day for DVT prophylaxis 6 weeks. She may shower but keep the bandage and splint dry with a plastic bag.     Signed: Thornton Park ,MD 08/11/2015, 12:57 PM

## 2015-08-15 DIAGNOSIS — I10 Essential (primary) hypertension: Secondary | ICD-10-CM | POA: Diagnosis not present

## 2015-08-15 DIAGNOSIS — S82041D Displaced comminuted fracture of right patella, subsequent encounter for closed fracture with routine healing: Secondary | ICD-10-CM | POA: Diagnosis not present

## 2015-08-17 DIAGNOSIS — I10 Essential (primary) hypertension: Secondary | ICD-10-CM | POA: Diagnosis not present

## 2015-08-17 DIAGNOSIS — S82041D Displaced comminuted fracture of right patella, subsequent encounter for closed fracture with routine healing: Secondary | ICD-10-CM | POA: Diagnosis not present

## 2015-08-19 DIAGNOSIS — I10 Essential (primary) hypertension: Secondary | ICD-10-CM | POA: Diagnosis not present

## 2015-08-19 DIAGNOSIS — S82041D Displaced comminuted fracture of right patella, subsequent encounter for closed fracture with routine healing: Secondary | ICD-10-CM | POA: Diagnosis not present

## 2015-08-22 DIAGNOSIS — S82011D Displaced osteochondral fracture of right patella, subsequent encounter for closed fracture with routine healing: Secondary | ICD-10-CM | POA: Diagnosis not present

## 2015-08-25 DIAGNOSIS — S82041D Displaced comminuted fracture of right patella, subsequent encounter for closed fracture with routine healing: Secondary | ICD-10-CM | POA: Diagnosis not present

## 2015-08-25 DIAGNOSIS — I10 Essential (primary) hypertension: Secondary | ICD-10-CM | POA: Diagnosis not present

## 2015-08-26 DIAGNOSIS — S82041D Displaced comminuted fracture of right patella, subsequent encounter for closed fracture with routine healing: Secondary | ICD-10-CM | POA: Diagnosis not present

## 2015-08-26 DIAGNOSIS — I10 Essential (primary) hypertension: Secondary | ICD-10-CM | POA: Diagnosis not present

## 2015-09-02 DIAGNOSIS — I10 Essential (primary) hypertension: Secondary | ICD-10-CM | POA: Diagnosis not present

## 2015-09-02 DIAGNOSIS — S82041D Displaced comminuted fracture of right patella, subsequent encounter for closed fracture with routine healing: Secondary | ICD-10-CM | POA: Diagnosis not present

## 2015-09-10 DIAGNOSIS — M6281 Muscle weakness (generalized): Secondary | ICD-10-CM | POA: Diagnosis not present

## 2015-09-10 DIAGNOSIS — Z8673 Personal history of transient ischemic attack (TIA), and cerebral infarction without residual deficits: Secondary | ICD-10-CM | POA: Diagnosis not present

## 2015-09-14 DIAGNOSIS — S82011D Displaced osteochondral fracture of right patella, subsequent encounter for closed fracture with routine healing: Secondary | ICD-10-CM | POA: Diagnosis not present

## 2015-09-21 DIAGNOSIS — E782 Mixed hyperlipidemia: Secondary | ICD-10-CM | POA: Diagnosis not present

## 2015-09-21 DIAGNOSIS — E7800 Pure hypercholesterolemia, unspecified: Secondary | ICD-10-CM | POA: Diagnosis not present

## 2015-09-21 DIAGNOSIS — I10 Essential (primary) hypertension: Secondary | ICD-10-CM | POA: Diagnosis not present

## 2015-09-21 DIAGNOSIS — R748 Abnormal levels of other serum enzymes: Secondary | ICD-10-CM | POA: Diagnosis not present

## 2015-09-23 DIAGNOSIS — I10 Essential (primary) hypertension: Secondary | ICD-10-CM | POA: Diagnosis not present

## 2015-09-23 DIAGNOSIS — S82041D Displaced comminuted fracture of right patella, subsequent encounter for closed fracture with routine healing: Secondary | ICD-10-CM | POA: Diagnosis not present

## 2015-09-29 DIAGNOSIS — Z23 Encounter for immunization: Secondary | ICD-10-CM | POA: Diagnosis not present

## 2015-09-29 DIAGNOSIS — R7301 Impaired fasting glucose: Secondary | ICD-10-CM | POA: Diagnosis not present

## 2015-09-29 DIAGNOSIS — E782 Mixed hyperlipidemia: Secondary | ICD-10-CM | POA: Diagnosis not present

## 2015-09-29 DIAGNOSIS — F411 Generalized anxiety disorder: Secondary | ICD-10-CM | POA: Diagnosis not present

## 2015-09-29 DIAGNOSIS — R748 Abnormal levels of other serum enzymes: Secondary | ICD-10-CM | POA: Diagnosis not present

## 2015-09-29 DIAGNOSIS — I10 Essential (primary) hypertension: Secondary | ICD-10-CM | POA: Diagnosis not present

## 2015-09-29 DIAGNOSIS — Z720 Tobacco use: Secondary | ICD-10-CM | POA: Diagnosis not present

## 2015-09-29 DIAGNOSIS — R300 Dysuria: Secondary | ICD-10-CM | POA: Diagnosis not present

## 2015-09-30 DIAGNOSIS — S82011D Displaced osteochondral fracture of right patella, subsequent encounter for closed fracture with routine healing: Secondary | ICD-10-CM | POA: Diagnosis not present

## 2015-10-10 DIAGNOSIS — Z8673 Personal history of transient ischemic attack (TIA), and cerebral infarction without residual deficits: Secondary | ICD-10-CM | POA: Diagnosis not present

## 2015-10-10 DIAGNOSIS — M6281 Muscle weakness (generalized): Secondary | ICD-10-CM | POA: Diagnosis not present

## 2015-10-25 ENCOUNTER — Ambulatory Visit: Payer: Medicare Other | Admitting: Physical Therapy

## 2015-10-25 ENCOUNTER — Encounter
Admission: RE | Admit: 2015-10-25 | Discharge: 2015-10-25 | Disposition: A | Discharge status: Home / Self Care | Payer: Medicare Other | Source: Ambulatory Visit | Attending: Orthopedic Surgery | Admitting: Orthopedic Surgery

## 2015-10-25 DIAGNOSIS — Y791 Therapeutic (nonsurgical) and rehabilitative orthopedic devices associated with adverse incidents: Secondary | ICD-10-CM | POA: Diagnosis not present

## 2015-10-25 DIAGNOSIS — M1991 Primary osteoarthritis, unspecified site: Secondary | ICD-10-CM | POA: Diagnosis not present

## 2015-10-25 DIAGNOSIS — Z79899 Other long term (current) drug therapy: Secondary | ICD-10-CM | POA: Diagnosis not present

## 2015-10-25 DIAGNOSIS — Z8249 Family history of ischemic heart disease and other diseases of the circulatory system: Secondary | ICD-10-CM | POA: Diagnosis not present

## 2015-10-25 DIAGNOSIS — Z8673 Personal history of transient ischemic attack (TIA), and cerebral infarction without residual deficits: Secondary | ICD-10-CM | POA: Diagnosis not present

## 2015-10-25 DIAGNOSIS — Z90710 Acquired absence of both cervix and uterus: Secondary | ICD-10-CM | POA: Diagnosis not present

## 2015-10-25 DIAGNOSIS — Z9841 Cataract extraction status, right eye: Secondary | ICD-10-CM | POA: Diagnosis not present

## 2015-10-25 DIAGNOSIS — T8484XA Pain due to internal orthopedic prosthetic devices, implants and grafts, initial encounter: Secondary | ICD-10-CM | POA: Diagnosis not present

## 2015-10-25 DIAGNOSIS — Z880 Allergy status to penicillin: Secondary | ICD-10-CM | POA: Diagnosis not present

## 2015-10-25 DIAGNOSIS — F17210 Nicotine dependence, cigarettes, uncomplicated: Secondary | ICD-10-CM | POA: Diagnosis not present

## 2015-10-25 DIAGNOSIS — Z8544 Personal history of malignant neoplasm of other female genital organs: Secondary | ICD-10-CM | POA: Diagnosis not present

## 2015-10-25 DIAGNOSIS — F419 Anxiety disorder, unspecified: Secondary | ICD-10-CM | POA: Diagnosis not present

## 2015-10-25 DIAGNOSIS — Z91048 Other nonmedicinal substance allergy status: Secondary | ICD-10-CM | POA: Diagnosis not present

## 2015-10-25 DIAGNOSIS — Z8679 Personal history of other diseases of the circulatory system: Secondary | ICD-10-CM | POA: Diagnosis not present

## 2015-10-25 DIAGNOSIS — I10 Essential (primary) hypertension: Secondary | ICD-10-CM | POA: Diagnosis not present

## 2015-10-25 DIAGNOSIS — Z96642 Presence of left artificial hip joint: Secondary | ICD-10-CM | POA: Diagnosis not present

## 2015-10-25 DIAGNOSIS — Z7982 Long term (current) use of aspirin: Secondary | ICD-10-CM | POA: Diagnosis not present

## 2015-10-25 DIAGNOSIS — S82011D Displaced osteochondral fracture of right patella, subsequent encounter for closed fracture with routine healing: Secondary | ICD-10-CM | POA: Diagnosis not present

## 2015-10-25 HISTORY — DX: Hyperlipidemia, unspecified: E78.5

## 2015-10-25 NOTE — Patient Instructions (Signed)
  Your procedure is scheduled on: 10/27/15 Thurs Report to Day Surgery. To find out your arrival time please call [phone] between 1PM - 3PM on 10/26/15 Wed.  Remember: Instructions that are not followed completely may result in serious medical risk, up to and including death, or upon the discretion of your surgeon and anesthesiologist your surgery may need to be rescheduled.    _x___ 1. Do not eat food or drink liquids after midnight. No gum chewing or hard candies.     ____ 2. No Alcohol for 24 hours before or after surgery.   ____ 3. Bring all medications with you on the day of surgery if instructed.    _x___ 4. Notify your doctor if there is any change in your medical condition     (cold, fever, infections).     Do not wear jewelry, make-up, hairpins, clips or nail polish.  Do not wear lotions, powders, or perfumes. You may wear deodorant.  Do not shave 48 hours prior to surgery. Men may shave face and neck.  Do not bring valuables to the hospital.    [facility] is not responsible for any belongings or valuables.               Contacts, dentures or bridgework may not be worn into surgery.  Leave your suitcase in the car. After surgery it may be brought to your room.  For patients admitted to the hospital, discharge time is determined by your                treatment team.   Patients discharged the day of surgery will not be allowed to drive home.   Please read over the following fact sheets that you were given:      __x__ Take these medicines the morning of surgery with A SIP OF WATER:    1. clonazePAM (KLONOPIN) 0.5 MG tablet  2. losartan (COZAAR) 25 MG tablet  3.   4.  5.  6.  ____ Fleet Enema (as directed)   x__ Use CHG Soap as directed  ____ Use inhalers on the day of surgery  ____ Stop metformin 2 days prior to surgery    ____ Take 1/2 of usual insulin dose the night before surgery and none on the morning of surgery.   _x___ Stop Coumadin/Plavix/aspirin  on stop aspirin today  ____ Stop Anti-inflammatories on    ____ Stop supplements until after surgery.    ____ Bring C-Pap to the hospital.

## 2015-10-27 ENCOUNTER — Ambulatory Visit: Payer: Medicare Other | Admitting: Certified Registered Nurse Anesthetist

## 2015-10-27 ENCOUNTER — Encounter: Payer: Self-pay | Admitting: *Deleted

## 2015-10-27 ENCOUNTER — Ambulatory Visit
Admission: RE | Admit: 2015-10-27 | Discharge: 2015-10-27 | Disposition: A | Discharge status: Home / Self Care | Payer: Medicare Other | Source: Ambulatory Visit | Attending: Orthopedic Surgery | Admitting: Orthopedic Surgery

## 2015-10-27 ENCOUNTER — Ambulatory Visit: Payer: Medicare Other | Admitting: Physical Therapy

## 2015-10-27 ENCOUNTER — Encounter
Admission: RE | Disposition: A | Discharge status: Home / Self Care | Payer: Self-pay | Source: Ambulatory Visit | Attending: Orthopedic Surgery

## 2015-10-27 DIAGNOSIS — F17210 Nicotine dependence, cigarettes, uncomplicated: Secondary | ICD-10-CM | POA: Insufficient documentation

## 2015-10-27 DIAGNOSIS — Z96642 Presence of left artificial hip joint: Secondary | ICD-10-CM | POA: Diagnosis not present

## 2015-10-27 DIAGNOSIS — Z8673 Personal history of transient ischemic attack (TIA), and cerebral infarction without residual deficits: Secondary | ICD-10-CM | POA: Insufficient documentation

## 2015-10-27 DIAGNOSIS — M1991 Primary osteoarthritis, unspecified site: Secondary | ICD-10-CM | POA: Diagnosis not present

## 2015-10-27 DIAGNOSIS — Z9841 Cataract extraction status, right eye: Secondary | ICD-10-CM | POA: Diagnosis not present

## 2015-10-27 DIAGNOSIS — I10 Essential (primary) hypertension: Secondary | ICD-10-CM | POA: Insufficient documentation

## 2015-10-27 DIAGNOSIS — Z8249 Family history of ischemic heart disease and other diseases of the circulatory system: Secondary | ICD-10-CM | POA: Insufficient documentation

## 2015-10-27 DIAGNOSIS — Z90710 Acquired absence of both cervix and uterus: Secondary | ICD-10-CM | POA: Diagnosis not present

## 2015-10-27 DIAGNOSIS — Z880 Allergy status to penicillin: Secondary | ICD-10-CM | POA: Insufficient documentation

## 2015-10-27 DIAGNOSIS — S82011A Displaced osteochondral fracture of right patella, initial encounter for closed fracture: Secondary | ICD-10-CM | POA: Diagnosis not present

## 2015-10-27 DIAGNOSIS — F419 Anxiety disorder, unspecified: Secondary | ICD-10-CM | POA: Diagnosis not present

## 2015-10-27 DIAGNOSIS — Z8544 Personal history of malignant neoplasm of other female genital organs: Secondary | ICD-10-CM | POA: Diagnosis not present

## 2015-10-27 DIAGNOSIS — Z8679 Personal history of other diseases of the circulatory system: Secondary | ICD-10-CM | POA: Insufficient documentation

## 2015-10-27 DIAGNOSIS — Y791 Therapeutic (nonsurgical) and rehabilitative orthopedic devices associated with adverse incidents: Secondary | ICD-10-CM | POA: Insufficient documentation

## 2015-10-27 DIAGNOSIS — Z91048 Other nonmedicinal substance allergy status: Secondary | ICD-10-CM | POA: Diagnosis not present

## 2015-10-27 DIAGNOSIS — Z79899 Other long term (current) drug therapy: Secondary | ICD-10-CM | POA: Insufficient documentation

## 2015-10-27 DIAGNOSIS — Z7982 Long term (current) use of aspirin: Secondary | ICD-10-CM | POA: Diagnosis not present

## 2015-10-27 DIAGNOSIS — T8484XA Pain due to internal orthopedic prosthetic devices, implants and grafts, initial encounter: Secondary | ICD-10-CM | POA: Diagnosis not present

## 2015-10-27 HISTORY — PX: HARDWARE REMOVAL: SHX979

## 2015-10-27 SURGERY — REMOVAL, HARDWARE
Anesthesia: Monitor Anesthesia Care | Laterality: Right

## 2015-10-27 MED ORDER — CLINDAMYCIN PHOSPHATE 600 MG/50ML IV SOLN
INTRAVENOUS | Status: AC
  Filled 2015-10-27: qty 50

## 2015-10-27 MED ORDER — LIDOCAINE HCL (PF) 1 % IJ SOLN
INTRAMUSCULAR | Status: AC
  Filled 2015-10-27: qty 30

## 2015-10-27 MED ORDER — ONDANSETRON HCL 4 MG/2ML IJ SOLN: 4.0000 mg | Freq: Once | INTRAMUSCULAR | Status: DC | PRN

## 2015-10-27 MED ORDER — ONDANSETRON HCL 4 MG/2ML IJ SOLN
INTRAMUSCULAR | Status: DC | PRN
Start: 2015-10-27 — End: 2015-10-27
  Administered 2015-10-27: 4 mg via INTRAVENOUS

## 2015-10-27 MED ORDER — FAMOTIDINE 20 MG PO TABS
20.0000 mg | ORAL_TABLET | Freq: Once | ORAL | Status: AC
  Administered 2015-10-27: 20 mg via ORAL

## 2015-10-27 MED ORDER — HYDROCODONE-ACETAMINOPHEN 5-325 MG PO TABS: 1.0000 | ORAL_TABLET | Freq: Four times a day (QID) | ORAL | Status: DC | PRN

## 2015-10-27 MED ORDER — BUPIVACAINE HCL (PF) 0.5 % IJ SOLN
INTRAMUSCULAR | Status: AC
  Filled 2015-10-27: qty 30

## 2015-10-27 MED ORDER — LACTATED RINGERS IV SOLN
INTRAVENOUS | Status: DC
  Administered 2015-10-27: 09:00:00 via INTRAVENOUS

## 2015-10-27 MED ORDER — ONDANSETRON HCL 4 MG PO TABS: 4.0000 mg | ORAL_TABLET | Freq: Three times a day (TID) | ORAL | Status: DC | PRN

## 2015-10-27 MED ORDER — FAMOTIDINE 20 MG PO TABS
ORAL_TABLET | ORAL | Status: AC
  Filled 2015-10-27: qty 1

## 2015-10-27 MED ORDER — CLINDAMYCIN PHOSPHATE 900 MG/50ML IV SOLN
INTRAVENOUS | Status: DC | PRN
  Administered 2015-10-27: 600 mg via INTRAVENOUS

## 2015-10-27 MED ORDER — BUPIVACAINE HCL (PF) 0.5 % IJ SOLN
INTRAMUSCULAR | Status: DC | PRN
  Administered 2015-10-27: 10 mL
  Administered 2015-10-27: 8 mL

## 2015-10-27 MED ORDER — FENTANYL CITRATE (PF) 100 MCG/2ML IJ SOLN: 25.0000 ug | INTRAMUSCULAR | Status: DC | PRN

## 2015-10-27 MED ORDER — BUPIVACAINE HCL (PF) 0.25 % IJ SOLN
INTRAMUSCULAR | Status: AC
  Filled 2015-10-27: qty 30

## 2015-10-27 MED ORDER — MIDAZOLAM HCL 2 MG/2ML IJ SOLN
INTRAMUSCULAR | Status: DC | PRN
  Administered 2015-10-27: 2 mg via INTRAVENOUS

## 2015-10-27 MED ORDER — FENTANYL CITRATE (PF) 100 MCG/2ML IJ SOLN
INTRAMUSCULAR | Status: DC | PRN
  Administered 2015-10-27 (×3): 50 ug via INTRAVENOUS

## 2015-10-27 SURGICAL SUPPLY — 47 items
BANDAGE ACE 6X5 VEL STRL LF (GAUZE/BANDAGES/DRESSINGS) ×3 IMPLANT
BANDAGE ELASTIC 6 LF NS (GAUZE/BANDAGES/DRESSINGS) ×3 IMPLANT
BLADE SURG SZ10 CARB STEEL (BLADE) ×3 IMPLANT
BNDG COHESIVE 4X5 TAN STRL (GAUZE/BANDAGES/DRESSINGS) IMPLANT
BNDG ESMARK 6X12 TAN STRL LF (GAUZE/BANDAGES/DRESSINGS) IMPLANT
BRACE KNEE POST OP SHORT (BRACE) IMPLANT
CANISTER SUCT 1200ML W/VALVE (MISCELLANEOUS) ×3 IMPLANT
COOLER POLAR GLACIER W/PUMP (MISCELLANEOUS) IMPLANT
DRAPE C-ARM XRAY 36X54 (DRAPES) IMPLANT
DRAPE C-ARMOR (DRAPES) IMPLANT
DRAPE INCISE IOBAN 66X45 STRL (DRAPES) IMPLANT
DRAPE U-SHAPE 47X51 STRL (DRAPES) IMPLANT
DURAPREP 26ML APPLICATOR (WOUND CARE) ×6 IMPLANT
GAUZE PETRO XEROFOAM 1X8 (MISCELLANEOUS) ×3 IMPLANT
GAUZE SPONGE 4X4 12PLY STRL (GAUZE/BANDAGES/DRESSINGS) ×3 IMPLANT
GAUZE XEROFORM 4X4 STRL (GAUZE/BANDAGES/DRESSINGS) ×3 IMPLANT
GLOVE BIOGEL PI IND STRL 9 (GLOVE) ×1 IMPLANT
GLOVE BIOGEL PI INDICATOR 9 (GLOVE) ×2
GLOVE SURG 9.0 ORTHO LTXF (GLOVE) ×9 IMPLANT
GOWN STRL REUS TWL 2XL XL LVL4 (GOWN DISPOSABLE) ×3 IMPLANT
GOWN STRL REUS W/ TWL LRG LVL3 (GOWN DISPOSABLE) ×1 IMPLANT
GOWN STRL REUS W/TWL LRG LVL3 (GOWN DISPOSABLE) ×2
IMMOB KNEE 14 THIGH 24 706614 (SOFTGOODS) IMPLANT
IV CATH ANGIO 14GX1.88 NO SAFE (IV SOLUTION) IMPLANT
KIT RM TURNOVER STRD PROC AR (KITS) ×3 IMPLANT
NDL SAFETY 18GX1.5 (NEEDLE) ×3 IMPLANT
NS IRRIG 500ML POUR BTL (IV SOLUTION) ×3 IMPLANT
PACK EXTREMITY ARMC (MISCELLANEOUS) ×3 IMPLANT
PAD ABD DERMACEA PRESS 5X9 (GAUZE/BANDAGES/DRESSINGS) ×6 IMPLANT
PAD GROUND ADULT SPLIT (MISCELLANEOUS) IMPLANT
PAD PREP 24X41 OB/GYN DISP (PERSONAL CARE ITEMS) IMPLANT
PAD WRAPON POLAR KNEE (MISCELLANEOUS) IMPLANT
PADDING CAST 4IN STRL (MISCELLANEOUS)
PADDING CAST BLEND 4X4 STRL (MISCELLANEOUS) IMPLANT
RETRIEVER SUT HEWSON (MISCELLANEOUS) IMPLANT
SPONGE LAP 18X18 5 PK (GAUZE/BANDAGES/DRESSINGS) IMPLANT
STAPLER SKIN PROX 35W (STAPLE) IMPLANT
STOCKINETTE BIAS CUT 6 980064 (GAUZE/BANDAGES/DRESSINGS) ×3 IMPLANT
STOCKINETTE IMPERVIOUS 9X36 MD (GAUZE/BANDAGES/DRESSINGS) IMPLANT
SUT FIBERWIRE #5 38 BLUE (WIRE) IMPLANT
SUT STEEL 7 (SUTURE) IMPLANT
SUT TICRON 2-0 30IN 311381 (SUTURE) IMPLANT
SUT VIC AB 0 CT1 36 (SUTURE) IMPLANT
SUT VIC AB 2-0 CT1 (SUTURE) IMPLANT
SUT VIC AB 2-0 CT1 36 (SUTURE) IMPLANT
SYRINGE 10CC LL (SYRINGE) IMPLANT
WRAPON POLAR PAD KNEE (MISCELLANEOUS)

## 2015-10-27 NOTE — H&P (Signed)
PREOPERATIVE H&P  Chief Complaint: PAINFULL HARDWARE s/p PATELLA ORIF RIGHT KNEE  HPI: Sydney Coleman is a 65 y.o. female who presents for preoperative history and physical with a diagnosis of  RIGHT PATELLA PAINFULL HARDWARE. Symptoms are rated as moderate to severe, and has been worsening.  This is significantly impairing activities of daily living.  I have recommended that the lateral wire that is backing out and tenting her skin be removed.  Past Medical History  Diagnosis Date  . TIA (transient ischemic attack)   . Hypertension   . Chronic headaches   . Anxiety   . SAH (subarachnoid hemorrhage) ([place]) 05/11/2015  . Cancer of vulva ([employer])   . Acute spontaneous subarachnoid intracranial hemorrhage ([facility])   . Stroke [facility])     TIA  . Arthritis   . PONV (postoperative nausea and vomiting)   . Difficult intubation     two (2) teeth broken during intubation  . Elevated lipids    Past Surgical History  Procedure Laterality Date  . Thyroidectomy, partial    . Placement of breast implants    . Abdominal hysterectomy    . Tonsillectomy    . Breast surgery    . Eye surgery Right     Cataract Extraction  . Joint replacement Left     Partial Hip Replacement  . Orif patella Right 08/10/2015    Procedure: OPEN REDUCTION INTERNAL (ORIF) FIXATION PATELLA TENSION BAND WIRING;  Surgeon: Thornton Park, MD;  Location: [facility] ORS;  Service: Orthopedics;  Laterality: Right;   Social History   Social History  . Marital Status: Married    Spouse Name: N/A  . Number of Children: 3  . Years of Education: HS   Occupational History  . Retired    Social History Main Topics  . Smoking status: Current Every Day Smoker -- 1.00 packs/day    Types: Cigarettes  . Smokeless tobacco: Never Used     Comment: Refuses to tell how much she smokes   . Alcohol Use: No  . Drug Use: No  . Sexual Activity: Not Asked   Other Topics Concern  . None   Social History Narrative   Lives at home with her  husband.   Right-handed.   2 cups coffee per day.   Family History  Problem Relation Age of Onset  . Stroke Mother   . Congestive Heart Failure Father   . Heart disease Mother    Allergies  Allergen Reactions  . Amoxil [Amoxicillin] Rash  . Augmentin [Amoxicillin-Pot Clavulanate] Rash  . Other Rash    Elastic  . Vancomycin Rash   Prior to Admission medications   Medication Sig Start Date End Date Taking? Authorizing Provider  clonazePAM (KLONOPIN) 0.5 MG tablet Take 0.5 mg by mouth daily as needed for anxiety.    Yes Historical Provider, MD  FOLIC ACID PO Take 1 tablet by mouth daily.    Yes Historical Provider, MD  losartan (COZAAR) 25 MG tablet Take 25 mg by mouth daily.    Yes Historical Provider, MD  pravastatin (PRAVACHOL) 20 MG tablet Take 20 mg by mouth daily.   Yes Historical Provider, MD  traMADol (ULTRAM) 50 MG tablet Take by mouth every 6 (six) hours as needed.   Yes Historical Provider, MD  aspirin 81 MG tablet Take 81 mg by mouth daily.    Historical Provider, MD  cyanocobalamin 100 MCG tablet Take 100 mcg by mouth daily. Reported on 10/27/2015    Historical Provider, MD  Positive ROS: All other systems have been reviewed and were otherwise negative with the exception of those mentioned in the HPI and as above.  Physical Exam: General: Alert, no acute distress Cardiovascular: Regular rate and rhythm, no murmurs rubs or gallops.  No pedal edema Respiratory: Clear to auscultation bilaterally, no wheezes rales or rhonchi. No cyanosis, no use of accessory musculature GI: No organomegaly, abdomen is soft and non-tender nondistended with positive bowel sounds. Skin: Skin intact, no lesions within the operative field. Neurologic: Sensation intact distally Psychiatric: Patient is competent for consent with normal mood and affect Lymphatic: No cervical lymphadenopathy  MUSCULOSKELETAL: Right knee:  Skin tenting over wire when knee flexed.  Tenderness over the pin,  right knee.  NVI.  Assessment: RIGHT PATELLA PAINFUL HARDWARE REQUIRING REMOVAL  Plan: Plan for Procedure(s): HARDWARE REMOVAL, RIGHT KNEE  I explained to the patient I intend to make a small stab incision.and remove the lateral vertical wire tenting her skin.  She understands the procedure and agrees to proceed.  I discussed the risks and benefits of surgery. The risks include but are not limited to infection, bleeding  nerve or blood vessel injury, joint stiffness or loss of motion, persistent pain, weakness or instability, malunion, nonunion and hardware failure and the need for further surgery. Medical risks include but are not limited to DVT and pulmonary embolism, myocardial infarction, stroke, pneumonia, respiratory failure and death. Patient understood these risks and wished to proceed. Patient and her husband understood and agreed with this plan.  Thornton Park, MD   10/27/2015 9:42 AM

## 2015-10-27 NOTE — Discharge Instructions (Signed)
AMBULATORY SURGERY  DISCHARGE INSTRUCTIONS   1) The drugs that you were given will stay in your system until tomorrow so for the next 24 hours you should not:  A) Drive an automobile B) Make any legal decisions C) Drink any alcoholic beverage   2) You may resume regular meals tomorrow.  Today it is better to start with liquids and gradually work up to solid foods.  You may eat anything you prefer, but it is better to start with liquids, then soup and crackers, and gradually work up to solid foods.   3) Please notify your doctor immediately if you have any unusual bleeding, trouble breathing, redness and pain at the surgery site, drainage, fever, or pain not relieved by medication.    4) Additional Instructions:        Please contact your physician with any problems or Same Day Surgery at [phone], Monday through Friday 6 am to 4 pm, or [place] [facility] number at [phone].  May remove bandage in 3 days. Pt may weight-bear as tolerated on the right lower extremity. Apply ice to the right knee as needed. Continue physical therapy as prescribed. Drink plenty of fluids. Prune juice may be helpful. You may use stool softner, such as Colace(over the counter)100mg  twice a day. Use Miralax (over the counter) for constipation as needed. No driving for 2 weeks. No lifting 4-6 weeks.

## 2015-10-27 NOTE — Transfer of Care (Signed)
Immediate Anesthesia Transfer of Care Note  Patient: Sydney Coleman  Procedure(s) Performed: Procedure(s): HARDWARE REMOVAL (Right)  Patient Location: PACU  Anesthesia Type:MAC  Level of Consciousness: awake, alert  and oriented  Airway & Oxygen Therapy: Patient Spontanous Breathing  Post-op Assessment: Report given to RN and Post -op Vital signs reviewed and stable  Post vital signs: Reviewed and stable  Last Vitals:  Filed Vitals:   10/27/15 0844  BP: 136/76  Pulse: 77  Temp: 37 C  Resp: 16    Complications: No apparent anesthesia complications

## 2015-10-27 NOTE — Op Note (Signed)
10/27/2015  10:45 AM  PATIENT:  Sydney Coleman    PRE-OPERATIVE DIAGNOSIS:  PAINFULL HARDWARE Right knee  POST-OPERATIVE DIAGNOSIS:  Same  PROCEDURE:  HARDWARE REMOVAL, right knee  SURGEON:  Thornton Park, MD  ANESTHESIA:   MAC and local with 1% lidocaine plain and half percent Marcaine plain (50:50 mix)  PREOPERATIVE INDICATIONS:  Sydney Coleman is a  65 y.o. female with a diagnosis of PAINFULL HARDWARE RIGHT KNEE.  Previous pin placed for fixation of patella fracture has backed out and is now tenting her skin. I have recommended surgical removal.    I discussed the risks and benefits of surgery. The risks include but are not limited to infection, bleeding requiring blood transfusion, nerve or blood vessel injury, joint stiffness or loss of motion, persistent pain, weakness or instability, malunion, nonunion and hardware failure and the need for further surgery. Medical risks include but are not limited to DVT and pulmonary embolism, myocardial infarction, stroke, pneumonia, respiratory failure and death. Patient understood these risks and wished to proceed.    OPERATIVE PROCEDURE: Patient was was brought to the operating room. She was placed supine on the operative table. She was given Mac anesthesia. She was prepped and draped in a sterile fashion. A timeout was performed to verify the patient's name, date of birth, medical record number, correct site of surgery correct procedure to be performed. Once all in attendance were in agreement case began.  The knee was flexed to proceed 45. The pain that was tenting the skin was easily visualized. A small stab incision was made over this pin. The pin was removed using a hemostat. The wound was copiously irrigated. The stab incision was closed with 4-0 nylon. A dry sterile and compressive dressing was applied to the right knee.   The patient then  brought to PACU in stable condition. I spoke with her. Husband the postop consultation room to let  him know the case was performed without complication and the patient was stable in recovery room. She'll follow-up with me in 7-10 days.     Timoteo Gaul, MD

## 2015-10-27 NOTE — Anesthesia Preprocedure Evaluation (Addendum)
Anesthesia Evaluation  Patient identified by MRN, date of birth, ID band Patient awake    Reviewed: Allergy & Precautions, NPO status , Patient's Chart, lab work & pertinent test results, reviewed documented beta blocker date and time   History of Anesthesia Complications (+) PONV, DIFFICULT AIRWAY and history of anesthetic complications  Airway Mallampati: II  TM Distance: >3 FB     Dental  (+) Upper Dentures, Lower Dentures   Pulmonary Current Smoker,           Cardiovascular hypertension, Pt. on medications      Neuro/Psych  Headaches, PSYCHIATRIC DISORDERS Anxiety TIA   GI/Hepatic   Endo/Other    Renal/GU      Musculoskeletal  (+) Arthritis ,   Abdominal   Peds  Hematology   Anesthesia Other Findings Pt was told she had a difficult airway long time ago. But she has dentures now and should not be a problem.  Reproductive/Obstetrics                            Anesthesia Physical Anesthesia Plan  ASA: II  Anesthesia Plan: General   Post-op Pain Management:    Induction: Intravenous  Airway Management Planned: Oral ETT  Additional Equipment:   Intra-op Plan:   Post-operative Plan:   Informed Consent: I have reviewed the patients History and Physical, chart, labs and discussed the procedure including the risks, benefits and alternatives for the proposed anesthesia with the patient or authorized representative who has indicated his/her understanding and acceptance.     Plan Discussed with: CRNA  Anesthesia Plan Comments:         Anesthesia Quick Evaluation

## 2015-10-27 NOTE — Anesthesia Postprocedure Evaluation (Signed)
Anesthesia Post Note  Patient: Sydney Coleman  Procedure(s) Performed: Procedure(s) (LRB): HARDWARE REMOVAL (Right)  Patient location during evaluation: PACU Anesthesia Type: General Level of consciousness: awake Pain management: pain level controlled Vital Signs Assessment: post-procedure vital signs reviewed and stable Respiratory status: spontaneous breathing Cardiovascular status: blood pressure returned to baseline Anesthetic complications: no    Last Vitals:  Filed Vitals:   10/27/15 1155 10/27/15 1222  BP: 134/63 141/64  Pulse: 60 55  Temp: 35.9 C   Resp: 16     Last Pain:  Filed Vitals:   10/27/15 1222  PainSc: [place]

## 2015-10-27 NOTE — Anesthesia Procedure Notes (Signed)
Procedure Name: MAC Date/Time: 10/27/2015 10:25 AM Performed by: Johnna Acosta Pre-anesthesia Checklist: Patient identified, Emergency Drugs available, Suction available, Patient being monitored and Timeout performed Patient Re-evaluated:Patient Re-evaluated prior to inductionOxygen Delivery Method: Nasal cannula

## 2015-11-01 ENCOUNTER — Encounter: Payer: Medicare Other | Admitting: Physical Therapy

## 2015-11-02 ENCOUNTER — Encounter: Payer: Medicare Other | Admitting: Physical Therapy

## 2015-11-07 DIAGNOSIS — T8484XA Pain due to internal orthopedic prosthetic devices, implants and grafts, initial encounter: Secondary | ICD-10-CM | POA: Diagnosis not present

## 2015-11-09 DIAGNOSIS — H2512 Age-related nuclear cataract, left eye: Secondary | ICD-10-CM | POA: Diagnosis not present

## 2015-11-10 ENCOUNTER — Encounter: Payer: Self-pay | Admitting: Physical Therapy

## 2015-11-10 ENCOUNTER — Ambulatory Visit: Payer: Medicare Other | Attending: Orthopedic Surgery | Admitting: Physical Therapy

## 2015-11-10 DIAGNOSIS — M25561 Pain in right knee: Secondary | ICD-10-CM | POA: Diagnosis not present

## 2015-11-10 DIAGNOSIS — M25661 Stiffness of right knee, not elsewhere classified: Secondary | ICD-10-CM | POA: Diagnosis not present

## 2015-11-10 DIAGNOSIS — R262 Difficulty in walking, not elsewhere classified: Secondary | ICD-10-CM | POA: Diagnosis not present

## 2015-11-10 DIAGNOSIS — Z8673 Personal history of transient ischemic attack (TIA), and cerebral infarction without residual deficits: Secondary | ICD-10-CM | POA: Diagnosis not present

## 2015-11-10 DIAGNOSIS — M6281 Muscle weakness (generalized): Secondary | ICD-10-CM | POA: Diagnosis not present

## 2015-11-10 NOTE — Therapy (Signed)
[place] MAIN [facility] SERVICES [address] [place], [place], [address] Phone: [phone]   Fax:  [phone]  Physical Therapy Evaluation  Patient Details  Name: Sydney Coleman MRN: [id number] Date of Birth: 1951/03/03 Referring Provider: Mack Guise  Encounter Date: 11/10/2015      PT End of Session - 11/10/15 1028    Visit Number 1   Number of Visits 17   Date for PT Re-Evaluation 01/05/16   Authorization Type gcode 1   Authorization Time Period 10   PT Start Time 0934   PT Stop Time 1032   PT Time Calculation (min) 58 min   Activity Tolerance Patient tolerated treatment well;Patient limited by pain   Behavior During Therapy [facility] for tasks assessed/performed      Past Medical History  Diagnosis Date  . TIA (transient ischemic attack)   . Chronic headaches   . SAH (subarachnoid hemorrhage) ([place]) 05/11/2015  . Acute spontaneous subarachnoid intracranial hemorrhage (HCC)   . Stroke [facility])     TIA  . Arthritis   . PONV (postoperative nausea and vomiting)   . Difficult intubation     two (2) teeth broken during intubation  . Elevated lipids   . Anxiety     better since pin removed;   Marland Kitchen Hypertension     controlled with medication;   . Cancer of vulva ([place])     >15 year ago;     Past Surgical History  Procedure Laterality Date  . Thyroidectomy, partial    . Placement of breast implants    . Abdominal hysterectomy    . Tonsillectomy    . Breast surgery    . Eye surgery Right     Cataract Extraction  . Joint replacement Left     Partial Hip Replacement  . Orif patella Right 08/10/2015    Procedure: OPEN REDUCTION INTERNAL (ORIF) FIXATION PATELLA TENSION BAND WIRING;  Surgeon: Thornton Park, MD;  Location: [facility] ORS;  Service: Orthopedics;  Laterality: Right;  . Hardware removal Right 10/27/2015    Procedure: HARDWARE REMOVAL;  Surgeon: Thornton Park, MD;  Location: [facility] ORS;  Service: Orthopedics;  Laterality: Right;     There were no vitals filed for this visit.  Visit Diagnosis:  Right knee pain - Plan: PT plan of care cert/re-cert  Knee stiffness, right - Plan: PT plan of care cert/re-cert  Difficulty walking - Plan: PT plan of care cert/re-cert      Subjective Assessment - 11/10/15 0941    Subjective 65 yo Female s/p right patella fracture on 07/31/16; She broke knee after falling at eye center. She reports, "I fell at a place that multiple people had fallen. They have been paying all my medical bills and they will keep paying" She reports that she went to ED. She reports being in pain and being upset that eye doctor didn't check on her to see how she was doing; Patient is s/p hardware removal on 10/27/15; She had home health PT after 1st surgery and now has been referred to outatient PT; Patient is now WBAT with no restrictions per patient;  She reports some tingling in right knee; no numbness in BLE;  Patient presents to therapy with 3 wheeled walker; She reports using walker most of time due to fear of falling;    Pertinent History Personal factors affecting rehab: chronic knee, current everyday smoker, anxiety somewhat controlled; increased risk for falls;    Limitations Standing;Walking   How long can you  sit comfortably? N/A   How long can you stand comfortably? 10 min   How long can you walk comfortably? >300 feet;    Diagnostic tests patient is s/p hardware removal on 10/27/15 with good results;    Patient Stated Goals "I want to be able to walk independently and not feel fearful of falling" Reduce knee pain; Be able to get in/out of car;    Currently in Pain? Yes   Pain Score 6    Pain Location Knee   Pain Orientation Right   Pain Descriptors / Indicators Tightness   Pain Type Chronic pain   Pain Radiating Towards radiates down lower leg to foot with overdoing it   Pain Onset More than a month ago   Pain Frequency Intermittent   Aggravating Factors  standing and walking long distances;  lying in bed it gets stiff;    Pain Relieving Factors laying down, ice/heat helps temporarily;    Effect of Pain on Daily Activities decreased activity;             [facility] PT Assessment - 11/10/15 0001    Assessment   Medical Diagnosis s/p right patella fracture with ORIF and hardware removal;    Referring Provider Mack Guise   Onset Date/Surgical Date 07/31/16   Hand Dominance Right   Next MD Visit 2 weeks   Prior Therapy had home health PT after 1st surgery; no outpatient PT for this condition;    Precautions   Precautions Fall   Restrictions   Weight Bearing Restrictions No   Other Position/Activity Restrictions RLE is WBAT   Balance Screen   Has the patient fallen in the past 6 months Yes   How many times? 1   Has the patient had a decrease in activity level because of a fear of falling?  No   Is the patient reluctant to leave their home because of a fear of falling?  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;    Cognition   Overall Cognitive Status Within Functional Limits for tasks assessed   Observation/Other Assessments   Lower Extremity Functional Scale  21/80 (the lower the score the greater the disability)   Sensation   Light Touch Appears Intact   Coordination   Gross Motor Movements are Fluid and Coordinated Yes   Posture/Postural Control   Posture Comments able to demonstrate erect sitting posture unsupported with cues;    AROM   Overall AROM Comments BUE and BLE AROM is WFL with exception of right knee;    Right Knee Extension -5  in supine   Right Knee Flexion 90  in supine; increased pain   Left Knee Extension 0   Left Knee Flexion 130   Strength   Right Hip Flexion 4+/5    Right Hip Extension 4+/5   Right Hip ABduction 4+/5   Right Hip ADduction 4+/5   Left Hip Flexion 4-/5   Left Hip Extension 4+/5   Left Hip ABduction 4+/5   Left Hip ADduction 4+/5   Right Knee Flexion 4-/5   Right Knee Extension 3-/5   Left Knee Flexion 4+/5   Left Knee Extension 4+/5   Right Ankle Dorsiflexion 4+/5  Right Ankle Plantar Flexion 4+/5   Left Ankle Dorsiflexion 4+/5   Left Ankle Plantar Flexion 4+/5   Palpation   Patella mobility hypomobile with increased discomfort with inferior and lateral patella mobs;    Palpation comment moderate tenderness to palpation of right patella; increased tightness along inferior right knee soft tissue;    Transfers   Comments requires HHA to push up from chair due to right knee discomfort;   Ambulation/Gait   Gait Comments able to ambulate without AD on even surface for short distances, with reciprocal gait pattern, decreased right knee extension in stance; decreased left step length;    Standardized Balance Assessment   Five times sit to stand comments  13 sec with BUE hand hold pushing up; (unable to transfer without pushing up which puts at increased risk for falls)   10 Meter Walk 0.769 m/s without AD (limited home ambulator, increased risk for falls);   High Level Balance   High Level Balance Comments able to stand without AD; fair static standing balance; unable to balance on one leg at this time;                            PT Education - 11/10/15 1028    Education provided Yes   Education Details findings, plan of care   Person(s) Educated Patient   Methods Explanation   Comprehension Verbalized understanding             PT Long Term Goals - 11/10/15 1045    PT LONG TERM GOAL #1   Title Patient will be independent in home exercise program to improve strength/mobility for better functional independence with ADLs. by 01/05/16   Time 8   Period Weeks   Status New   PT LONG TERM GOAL #2   Title  Patient (> 54 years old) will complete five times sit to stand test in < 15 seconds without HHA indicating an increased LE strength and improved balance. by 01/05/16   Time 8   Period Weeks   Status New   PT LONG TERM GOAL #3   Title Patient will increase 10 meter walk test to >1.15m/s as to improve gait speed for better community ambulation and to reduce fall risk. by 01/05/16   Time 8   Period Weeks   Status New   PT LONG TERM GOAL #4   Title Patient will ascend/descend 4 stairs with 1 rail assist independently forward reciprocally, without loss of balance to improve ability to negotiate steps inside house. by 01/05/16   Time 8   Period Weeks   Status New   PT LONG TERM GOAL #5   Title Patient will tolerate 5 seconds of single leg stance without loss of balance to improve ability to get in and out of shower safely. by 01/05/16   Time 8   Period Weeks   Status New   Additional Long Term Goals   Additional Long Term Goals Yes   PT LONG TERM GOAL #6   Title Patient will increase RLE knee AROM to 0-120 degrees to improve functional ROM for getting in/out of car, negotiating steps etc. by 01/05/16   Time 8   Period Weeks   Status New   PT LONG TERM GOAL #7   Title Patient will increase BLE gross strength to 4+/5 as to improve functional strength for independent gait, increased standing tolerance and increased ADL ability. by 01/05/16   Time 8  Period Weeks   Status New   PT LONG TERM GOAL #8   Title Patient will report a worst pain of 3/10 on VAS in      right knee       to improve tolerance with ADLs and reduced symptoms with activities.    Time 8   Period Weeks   Status New               Plan - 2015-12-04 1028    Clinical Impression Statement 65 yo Female s/p right patella fracture, presents to therapy with knee pain and stiffness. She demonstrates increased stiffness with knee flexion and hypomobility of patella; She tested as a high fall risk with slower gait speed and  difficulty with sit<>Stand tests. Patient would benefit from additional skilled PT intervention to improve LE strength, balance and gait safety;    Pt will benefit from skilled therapeutic intervention in order to improve on the following deficits Abnormal gait;Decreased endurance;Hypomobility;Cardiopulmonary status limiting activity;Decreased activity tolerance;Decreased strength;Pain;Increased muscle spasms;Difficulty walking;Decreased mobility;Decreased balance;Decreased range of motion;Impaired flexibility;Decreased safety awareness   Rehab Potential Fair   Clinical Impairments Affecting Rehab Potential positive: motivation, negative: chronic knee pain, chronic smoker which delays healing; Patient's cliinical presentation is evolving as patient is anxious, has limited ROM, doesn't tolerate much manual therapy is a current every day smoker which affects healing potential;    PT Frequency 2x / week   PT Duration 8 weeks   PT Treatment/Interventions ADLs/Self Care Home Management;Aquatic Therapy;Cryotherapy;Electrical Stimulation;Moist Heat;Balance training;Therapeutic exercise;Therapeutic activities;Functional mobility training;Gait training;Stair training;DME Instruction;Neuromuscular re-education;Patient/family education;Manual techniques;Taping;Energy conservation;Dry needling;Passive range of motion   PT Next Visit Plan initiate HEP   PT Home Exercise Plan will initiate next visit; instructed patient to continue current HEP that she is doing at home;    Consulted and Agree with Plan of Care Patient          G-Codes - December 04, 2015 1042    Functional Assessment Tool Used 5 times sit<>stand, 10 meter walk, clinical judgement;    Functional Limitation Mobility: Walking and moving around   Mobility: Walking and Moving Around Current Status [phone]) At least 40 percent but less than 60 percent impaired, limited or restricted   Mobility: Walking and Moving Around Goal Status [phone]) At least 1 percent  but less than 20 percent impaired, limited or restricted       Problem List Patient Active Problem List   Diagnosis Date Noted  . Patella fracture 08/10/2015  . SAH (subarachnoid hemorrhage) ([place]) 05/12/2015    Trotter,Margaret PT, DPT 12-04-2015, 10:50 AM  [place] MAIN [facility] SERVICES [address] [place], [place], [address] Phone: [phone]   Fax:  [phone]  Name: Sydney Coleman MRN: [id number] Date of Birth: 13-Aug-1951

## 2015-11-15 ENCOUNTER — Encounter: Payer: Medicare Other | Admitting: Physical Therapy

## 2015-11-17 ENCOUNTER — Encounter: Payer: Medicare Other | Admitting: Physical Therapy

## 2015-11-21 ENCOUNTER — Ambulatory Visit: Payer: Medicare Other | Attending: Orthopedic Surgery

## 2015-11-21 DIAGNOSIS — M25661 Stiffness of right knee, not elsewhere classified: Secondary | ICD-10-CM | POA: Diagnosis not present

## 2015-11-21 DIAGNOSIS — R262 Difficulty in walking, not elsewhere classified: Secondary | ICD-10-CM | POA: Diagnosis not present

## 2015-11-21 DIAGNOSIS — M25561 Pain in right knee: Secondary | ICD-10-CM | POA: Diagnosis not present

## 2015-11-21 NOTE — Therapy (Signed)
[place] MAIN [facility] SERVICES [address] [place], [place], [address] Phone: [phone]   Fax:  [phone]  Physical Therapy Treatment  Patient Details  Name: Sydney Coleman MRN: [id number] Date of Birth: 03-20-1951 Referring Provider: Mack Guise  Encounter Date: 11/21/2015      PT End of Session - 11/21/15 1300    Visit Number 2   Number of Visits 17   Date for PT Re-Evaluation 01/05/16   Authorization Type gcode 2   Authorization Time Period 10   PT Start Time 1115   PT Stop Time 1200   PT Time Calculation (min) 45 min   Activity Tolerance Patient tolerated treatment well;Patient limited by pain   Behavior During Therapy [facility] for tasks assessed/performed      Past Medical History  Diagnosis Date  . TIA (transient ischemic attack)   . Chronic headaches   . SAH (subarachnoid hemorrhage) ([place]) 05/11/2015  . Acute spontaneous subarachnoid intracranial hemorrhage (HCC)   . Stroke [facility])     TIA  . Arthritis   . PONV (postoperative nausea and vomiting)   . Difficult intubation     two (2) teeth broken during intubation  . Elevated lipids   . Anxiety     better since pin removed;   Marland Kitchen Hypertension     controlled with medication;   . Cancer of vulva ([place])     >15 year ago;     Past Surgical History  Procedure Laterality Date  . Thyroidectomy, partial    . Placement of breast implants    . Abdominal hysterectomy    . Tonsillectomy    . Breast surgery    . Eye surgery Right     Cataract Extraction  . Joint replacement Left     Partial Hip Replacement  . Orif patella Right 08/10/2015    Procedure: OPEN REDUCTION INTERNAL (ORIF) FIXATION PATELLA TENSION BAND WIRING;  Surgeon: Thornton Park, MD;  Location: [facility] ORS;  Service: Orthopedics;  Laterality: Right;  . Hardware removal Right 10/27/2015    Procedure: HARDWARE REMOVAL;  Surgeon: Thornton Park, MD;  Location: [facility] ORS;  Service: Orthopedics;  Laterality: Right;     There were no vitals filed for this visit.  Visit Diagnosis:  Right knee pain  Knee stiffness, right  Difficulty walking      Subjective Assessment - 11/21/15 1256    Subjective pt reports "this has been the most painful thing that has ever happened." "I fell at an eye center and they are paying for all of this." pt reports she has been working hard on home exercise.    Pertinent History Personal factors affecting rehab: chronic knee, current everyday smoker, anxiety somewhat controlled; increased risk for falls;    Limitations Standing;Walking   How long can you sit comfortably? N/A   How long can you stand comfortably? 10 min   How long can you walk comfortably? >300 feet;    Diagnostic tests patient is s/p hardware removal on 10/27/15 with good results;    Patient Stated Goals "I want to be able to walk independently and not feel fearful of falling" Reduce knee pain; Be able to get in/out of car;    Currently in Pain? Yes   Pain Score 6    Pain Location --  Right knee   Pain Onset More than a month ago      Nustep with slowed pace and cues to maximize R knee flexion AAROM x 3 min L1  Quad sets 2x10 R SLR 2x10 cues to keep knee straight Bridging 2x10 Heel slide (RLE) 2x10 min cues to increase R knee flexion Standing resisted hip flexion, abd, ext x 10 each way bilaterally with yellow band Side stepping with yellow band 33ft x 3 laps mod cues to keep toes forward Squats 2x10 min cues for equal weight baring and to minimize hip IR                           PT Education - 11/21/15 1259    Education provided Yes   Education Details progressed HEP   Person(s) Educated Patient   Methods Explanation;Handout   Comprehension Verbalized understanding;Verbal cues required;Returned demonstration             PT Long Term Goals - 11/10/15 1045    PT LONG TERM GOAL #1   Title Patient will be independent in home exercise program to improve  strength/mobility for better functional independence with ADLs. by 01/05/16   Time 8   Period Weeks   Status New   PT LONG TERM GOAL #2   Title Patient (> 57 years old) will complete five times sit to stand test in < 15 seconds without HHA indicating an increased LE strength and improved balance. by 01/05/16   Time 8   Period Weeks   Status New   PT LONG TERM GOAL #3   Title Patient will increase 10 meter walk test to >1.11m/s as to improve gait speed for better community ambulation and to reduce fall risk. by 01/05/16   Time 8   Period Weeks   Status New   PT LONG TERM GOAL #4   Title Patient will ascend/descend 4 stairs with 1 rail assist independently forward reciprocally, without loss of balance to improve ability to negotiate steps inside house. by 01/05/16   Time 8   Period Weeks   Status New   PT LONG TERM GOAL #5   Title Patient will tolerate 5 seconds of single leg stance without loss of balance to improve ability to get in and out of shower safely. by 01/05/16   Time 8   Period Weeks   Status New   Additional Long Term Goals   Additional Long Term Goals Yes   PT LONG TERM GOAL #6   Title Patient will increase RLE knee AROM to 0-120 degrees to improve functional ROM for getting in/out of car, negotiating steps etc. by 01/05/16   Time 8   Period Weeks   Status New   PT LONG TERM GOAL #7   Title Patient will increase BLE gross strength to 4+/5 as to improve functional strength for independent gait, increased standing tolerance and increased ADL ability. by 01/05/16   Time 8   Period Weeks   Status New   PT LONG TERM GOAL #8   Title Patient will report a worst pain of 3/10 on VAS in      right knee       to improve tolerance with ADLs and reduced symptoms with activities.    Time 8   Period Weeks   Status New               Plan - 11/21/15 1300    Clinical Impression Statement pt had difficutly at times with staying on task during session needing cues to focus on  exercise. She reports she "took a pain pill" before therapy. She did demonstrate relatively good  initial HEP needing some minor cues for correction with SLR and standing squats and hip exercise. progressed HEP today with focus on R knee ROM, quad and hip strength. pt did not have increased pain with therex today.    Pt will benefit from skilled therapeutic intervention in order to improve on the following deficits Abnormal gait;Decreased endurance;Hypomobility;Cardiopulmonary status limiting activity;Decreased activity tolerance;Decreased strength;Pain;Increased muscle spasms;Difficulty walking;Decreased mobility;Decreased balance;Decreased range of motion;Impaired flexibility;Decreased safety awareness   Rehab Potential Fair   Clinical Impairments Affecting Rehab Potential positive: motivation, negative: chronic knee pain, chronic smoker which delays healing; Patient's cliinical presentation is evolving as patient is anxious, has limited ROM, doesn't tolerate much manual therapy is a current every day smoker which affects healing potential;    PT Frequency 2x / week   PT Duration 8 weeks   PT Treatment/Interventions ADLs/Self Care Home Management;Aquatic Therapy;Cryotherapy;Electrical Stimulation;Moist Heat;Balance training;Therapeutic exercise;Therapeutic activities;Functional mobility training;Gait training;Stair training;DME Instruction;Neuromuscular re-education;Patient/family education;Manual techniques;Taping;Energy conservation;Dry needling;Passive range of motion   PT Next Visit Plan initiate [place] will initiate next visit; instructed patient to continue current HEP that she is doing at home;    Consulted and Agree with Plan of Care Patient        Problem List Patient Active Problem List   Diagnosis Date Noted  . Patella fracture 08/10/2015  . SAH (subarachnoid hemorrhage) ([place]) 05/12/2015   Gorden Harms. Tarius Stangelo, PT, DPT [phone]  Tyrihanna Wingert 11/21/2015, 1:02  PM  [place] MAIN [facility] SERVICES [address] [place], [place], [address] Phone: [phone]   Fax:  [phone]  Name: TAMELA LAFFEY MRN: [id number] Date of Birth: [birth year]

## 2015-11-21 NOTE — Patient Instructions (Signed)
Handout from HEP2go.com including: Quad sets 2x10  SLR 2x10 Bridging 2x10 Heel slide (RLE) 2x10 Standing resisted hip flexion, abd, ext x 10 each way bilaterally with yellow band Side stepping with yellow band 24ft x 3 laps Squats 2x10

## 2015-11-22 ENCOUNTER — Encounter: Payer: Medicare Other | Admitting: Physical Therapy

## 2015-11-22 DIAGNOSIS — H2512 Age-related nuclear cataract, left eye: Secondary | ICD-10-CM | POA: Diagnosis not present

## 2015-11-22 DIAGNOSIS — H25011 Cortical age-related cataract, right eye: Secondary | ICD-10-CM | POA: Diagnosis not present

## 2015-11-29 ENCOUNTER — Encounter: Payer: Medicare Other | Admitting: Physical Therapy

## 2015-11-29 DIAGNOSIS — H25011 Cortical age-related cataract, right eye: Secondary | ICD-10-CM | POA: Diagnosis not present

## 2015-12-01 ENCOUNTER — Ambulatory Visit: Payer: Medicare Other | Admitting: Physical Therapy

## 2015-12-01 ENCOUNTER — Encounter: Payer: Self-pay | Admitting: Physical Therapy

## 2015-12-01 DIAGNOSIS — M25561 Pain in right knee: Secondary | ICD-10-CM | POA: Diagnosis not present

## 2015-12-01 DIAGNOSIS — R262 Difficulty in walking, not elsewhere classified: Secondary | ICD-10-CM

## 2015-12-01 DIAGNOSIS — M25661 Stiffness of right knee, not elsewhere classified: Secondary | ICD-10-CM

## 2015-12-01 NOTE — Patient Instructions (Addendum)
   Copyright  VHI. All rights reserved.  Band Walk: Side Stepping   Tie band around legs,, around ankles. Step _10__ feet to one side, then step back to start. Repeat _2-3__ feet per session. Note: Small towel between band and skin eases rubbing.  http://plyo.exer.us/76   Copyright  VHI. All rights reserved.   Band Walk: Zig Zag   Tie green band around legs, just above knees. Walk forward _both__ feet in a zig zag pattern. Without turning walk backward to start for one zig zag. Repeat _2-3__ zig zags per session.   http://plyo.exer.us/80   Copyright  VHI. All rights reserved.   HIP / KNEE: Extension - Standing (Band)    Place band around leg (Behind knee). Put band in doorway and close door, have left foot behind so that right knee is bent (kind of like lunge position), then push knee straight against red band Hold _3__ seconds. Use __red______ band. _10__ reps per set, _2__ sets per day, _5__ days per week Hold onto a support.  Copyright  VHI. All rights reserved.

## 2015-12-01 NOTE — Therapy (Signed)
[place] MAIN [facility] SERVICES [address] [place], [place], [address] Phone: [phone]   Fax:  [phone]  Physical Therapy Treatment  Patient Details  Name: Sydney Coleman MRN: [id number] Date of Birth: [birth year] Referring Provider: Mack Guise  Encounter Date: 12/01/2015      PT End of Session - 12/01/15 1026    Visit Number 3   Number of Visits 17   Date for PT Re-Evaluation 01/05/16   Authorization Type Gcode 3   Authorization Time Period 10   PT Start Time 1020   PT Stop Time 1100   PT Time Calculation (min) 40 min   Activity Tolerance Patient tolerated treatment well;Patient limited by pain   Behavior During Therapy [facility] for tasks assessed/performed      Past Medical History  Diagnosis Date  . TIA (transient ischemic attack)   . Chronic headaches   . SAH (subarachnoid hemorrhage) ([place]) 05/11/2015  . Acute spontaneous subarachnoid intracranial hemorrhage (HCC)   . Stroke [facility])     TIA  . Arthritis   . PONV (postoperative nausea and vomiting)   . Difficult intubation     two (2) teeth broken during intubation  . Elevated lipids   . Anxiety     better since pin removed;   Marland Kitchen Hypertension     controlled with medication;   . Cancer of vulva ([place])     >15 year ago;     Past Surgical History  Procedure Laterality Date  . Thyroidectomy, partial    . Placement of breast implants    . Abdominal hysterectomy    . Tonsillectomy    . Breast surgery    . Eye surgery Right     Cataract Extraction  . Joint replacement Left     Partial Hip Replacement  . Orif patella Right 08/10/2015    Procedure: OPEN REDUCTION INTERNAL (ORIF) FIXATION PATELLA TENSION BAND WIRING;  Surgeon: Thornton Park, MD;  Location: [facility] ORS;  Service: Orthopedics;  Laterality: Right;  . Hardware removal Right 10/27/2015    Procedure: HARDWARE REMOVAL;  Surgeon: Thornton Park, MD;  Location: [facility] ORS;  Service: Orthopedics;  Laterality: Right;     There were no vitals filed for this visit.  Visit Diagnosis:  Right knee pain  Knee stiffness, right  Difficulty walking      Subjective Assessment - 12/01/15 1024    Subjective Patient reports doing well after cataracts surgery. She reports still having some stiffness and pain in right knee but overall its feeling a little better;    Pertinent History Personal factors affecting rehab: chronic knee, current everyday smoker, anxiety somewhat controlled; increased risk for falls;    Limitations Standing;Walking   How long can you sit comfortably? N/A   How long can you stand comfortably? 10 min   How long can you walk comfortably? >300 feet;    Diagnostic tests patient is s/p hardware removal on 10/27/15 with good results;    Patient Stated Goals "I want to be able to walk independently and not feel fearful of falling" Reduce knee pain; Be able to get in/out of car;    Currently in Pain? Yes   Pain Score 4    Pain Location Knee   Pain Orientation Right   Pain Descriptors / Indicators Tightness   Pain Type Chronic pain   Pain Onset More than a month ago         TREATMENT: Warm up on recumbent bike with BLE pumping forward/backward  x4 min (unbilled);  Quantum Leg press, BLE 75# 2x10; with min VCs to increase right terminal knee extension;  Standing RLE terminal knee extension green tband 2x12 with min VCs to increase hold time for increased knee extension;  Standing with red tband around both legs above knees: Diagonal step outs with knees bent 10 feet forward/backward x2 each; Hip abduction x10 bilaterally; Hip extension x10 bilaterally; Side stepping with red band 10 feet x2 laps each direction; Patient required mod VCs to increase right knee terminal knee extension with stand exercise during LLE movement;  Advanced HEP- see patient instructions. She was able to safely don/doff tband correctly.  Step ups on 4 inch step with 2-0 rail assist, x10 bilaterally with min  VCs to increase forward trunk lean for better step ability;  Patient reports increased right knee discomfort with step ups without rail assist; PT assessed patella mobility and identified increased right inferior stiffness. Educated patient on patella mobs x3 min;                         PT Education - 12/01/15 1025    Education provided Yes   Education Details LE strengthening   Person(s) Educated Patient   Methods Explanation;Verbal cues   Comprehension Verbalized understanding;Returned demonstration;Verbal cues required             PT Long Term Goals - 11/10/15 1045    PT LONG TERM GOAL #1   Title Patient will be independent in home exercise program to improve strength/mobility for better functional independence with ADLs. by 01/05/16   Time 8   Period Weeks   Status New   PT LONG TERM GOAL #2   Title Patient (> 6 years old) will complete five times sit to stand test in < 15 seconds without HHA indicating an increased LE strength and improved balance. by 01/05/16   Time 8   Period Weeks   Status New   PT LONG TERM GOAL #3   Title Patient will increase 10 meter walk test to >1.17m/s as to improve gait speed for better community ambulation and to reduce fall risk. by 01/05/16   Time 8   Period Weeks   Status New   PT LONG TERM GOAL #4   Title Patient will ascend/descend 4 stairs with 1 rail assist independently forward reciprocally, without loss of balance to improve ability to negotiate steps inside house. by 01/05/16   Time 8   Period Weeks   Status New   PT LONG TERM GOAL #5   Title Patient will tolerate 5 seconds of single leg stance without loss of balance to improve ability to get in and out of shower safely. by 01/05/16   Time 8   Period Weeks   Status New   Additional Long Term Goals   Additional Long Term Goals Yes   PT LONG TERM GOAL #6   Title Patient will increase RLE knee AROM to 0-120 degrees to improve functional ROM for getting in/out of  car, negotiating steps etc. by 01/05/16   Time 8   Period Weeks   Status New   PT LONG TERM GOAL #7   Title Patient will increase BLE gross strength to 4+/5 as to improve functional strength for independent gait, increased standing tolerance and increased ADL ability. by 01/05/16   Time 8   Period Weeks   Status New   PT LONG TERM GOAL #8   Title Patient will report a  worst pain of 3/10 on VAS in      right knee       to improve tolerance with ADLs and reduced symptoms with activities.    Time 8   Period Weeks   Status New               Plan - 12/01/15 1103    Clinical Impression Statement Instructed patient in BLE strengthening exercise. Patient required min Vcs to improve quad contraction and terminal knee extension; She reports slight increase in knee pain with step ups without rail assist. PT assessed right patella mobility; She did have increased stiffness with inferior glides. PT educated patient in patella mobs for increased flexibility. She would benefit from additional skilled PT intervention to improve ROM, strength and increase functional mobility;    Pt will benefit from skilled therapeutic intervention in order to improve on the following deficits Abnormal gait;Decreased endurance;Hypomobility;Cardiopulmonary status limiting activity;Decreased activity tolerance;Decreased strength;Pain;Increased muscle spasms;Difficulty walking;Decreased mobility;Decreased balance;Decreased range of motion;Impaired flexibility;Decreased safety awareness   Rehab Potential Fair   Clinical Impairments Affecting Rehab Potential positive: motivation, negative: chronic knee pain, chronic smoker which delays healing; Patient's cliinical presentation is evolving as patient is anxious, has limited ROM, doesn't tolerate much manual therapy is a current every day smoker which affects healing potential;    PT Frequency 2x / week   PT Duration 8 weeks   PT Treatment/Interventions ADLs/Self Care Home  Management;Aquatic Therapy;Cryotherapy;Electrical Stimulation;Moist Heat;Balance training;Therapeutic exercise;Therapeutic activities;Functional mobility training;Gait training;Stair training;DME Instruction;Neuromuscular re-education;Patient/family education;Manual techniques;Taping;Energy conservation;Dry needling;Passive range of motion   PT Next Visit Plan work on quad strengthening   PT Home Exercise Plan advanced- see patient instructions   Consulted and Agree with Plan of Care Patient        Problem List Patient Active Problem List   Diagnosis Date Noted  . Patella fracture 08/10/2015  . SAH (subarachnoid hemorrhage) ([place]) 05/12/2015    Juanjose Mojica PT, DPT 12/01/2015, 11:06 AM  [place] MAIN [facility] SERVICES [address] [place], [place], [address] Phone: [phone]   Fax:  [phone]  Name: Sydney Coleman MRN: [id number] Date of Birth: April 10, 1951

## 2015-12-06 ENCOUNTER — Encounter: Payer: Self-pay | Admitting: Physical Therapy

## 2015-12-06 ENCOUNTER — Ambulatory Visit: Payer: Medicare Other | Admitting: Physical Therapy

## 2015-12-06 DIAGNOSIS — T8484XA Pain due to internal orthopedic prosthetic devices, implants and grafts, initial encounter: Secondary | ICD-10-CM | POA: Diagnosis not present

## 2015-12-06 DIAGNOSIS — M25561 Pain in right knee: Secondary | ICD-10-CM

## 2015-12-06 DIAGNOSIS — R262 Difficulty in walking, not elsewhere classified: Secondary | ICD-10-CM | POA: Diagnosis not present

## 2015-12-06 DIAGNOSIS — M25661 Stiffness of right knee, not elsewhere classified: Secondary | ICD-10-CM | POA: Diagnosis not present

## 2015-12-06 NOTE — Patient Instructions (Addendum)
Achilles Tendon Stretch   Stand with hands supported on wall, elbows slightly bent, feet parallel and both heels on floor, front knee bent, back knee straight. Slowly relax back knee until a stretch is felt in achilles tendon. Hold _20___ seconds. Repeat with leg positions switched.    Copyright  VHI. All rights reserved.  ANKLE: Dorsiflexion, Step Unilateral   Stand on step, hang one heel off back of step. Hold _20__ seconds. __3_ reps per set, __2_ sets per day, _5_ days per week Hold onto a support.  Copyright  VHI. All rights reserved.   Hamstring Stretch    Reach down along right leg until a comfortable stretch is felt in back of thigh. Be sure to keep knee straight. Hold __20 seconds. Repeat __3_ times per set. Do _2__ sets per session. Do _2__ sessions per day.  http://orth.exer.us/157   Copyright  VHI. All rights reserved.

## 2015-12-06 NOTE — Therapy (Signed)
[place] MAIN [facility] SERVICES [address] [place], [place], [address] Phone: [phone]   Fax:  [phone]  Physical Therapy Treatment  Patient Details  Name: Sydney Coleman MRN: [id number] Date of Birth: [birth year] Referring Provider: Mack Guise  Encounter Date: 12/06/2015      PT End of Session - 12/06/15 1235    Visit Number 4   Number of Visits 17   Date for PT Re-Evaluation 01/05/16   Authorization Type Gcode 4   Authorization Time Period 10   PT Start Time 1018   PT Stop Time 1100   PT Time Calculation (min) 42 min   Activity Tolerance Patient tolerated treatment well;Patient limited by pain   Behavior During Therapy [facility] for tasks assessed/performed      Past Medical History  Diagnosis Date  . TIA (transient ischemic attack)   . Chronic headaches   . SAH (subarachnoid hemorrhage) ([place]) 05/11/2015  . Acute spontaneous subarachnoid intracranial hemorrhage (HCC)   . Stroke [facility])     TIA  . Arthritis   . PONV (postoperative nausea and vomiting)   . Difficult intubation     two (2) teeth broken during intubation  . Elevated lipids   . Anxiety     better since pin removed;   Marland Kitchen Hypertension     controlled with medication;   . Cancer of vulva ([place])     >15 year ago;     Past Surgical History  Procedure Laterality Date  . Thyroidectomy, partial    . Placement of breast implants    . Abdominal hysterectomy    . Tonsillectomy    . Breast surgery    . Eye surgery Right     Cataract Extraction  . Joint replacement Left     Partial Hip Replacement  . Orif patella Right 08/10/2015    Procedure: OPEN REDUCTION INTERNAL (ORIF) FIXATION PATELLA TENSION BAND WIRING;  Surgeon: Thornton Park, MD;  Location: [facility] ORS;  Service: Orthopedics;  Laterality: Right;  . Hardware removal Right 10/27/2015    Procedure: HARDWARE REMOVAL;  Surgeon: Thornton Park, MD;  Location: [facility] ORS;  Service: Orthopedics;  Laterality: Right;     There were no vitals filed for this visit.  Visit Diagnosis:  Right knee pain  Knee stiffness, right  Difficulty walking      Subjective Assessment - 12/06/15 1023    Subjective "We had the best time at the beach this weekend. I didn't want to come home." Patient reports that she did have difficulty walking on soft sand at the beach and didn't feel that she was ready for that just yet.   Pertinent History Personal factors affecting rehab: chronic knee, current everyday smoker, anxiety somewhat controlled; increased risk for falls;    Limitations Standing;Walking   How long can you sit comfortably? N/A   How long can you stand comfortably? 10 min   How long can you walk comfortably? >300 feet;    Diagnostic tests patient is s/p hardware removal on 10/27/15 with good results;    Patient Stated Goals "I want to be able to walk independently and not feel fearful of falling" Reduce knee pain; Be able to get in/out of car;    Currently in Pain? Yes   Pain Score 4    Pain Location Knee   Pain Orientation Right   Pain Descriptors / Indicators Tightness;Aching;Sore   Pain Type Chronic pain   Pain Onset More than a month ago  TREATMENT: Warm up on Nustep level 3 BUE/BLE x4 min (unbilled)  Resisted weighted gait 12.5# forward/backward, side/side 4 way, x2 laps each with min A for balance; and mod VCs to improve weight shift for better balance control;   Quantum Leg press, BLE 75# x12; RLE plate 30# [id number]  with min VCs to increase right terminal knee extension;  Standing on BOSU: Heel/toe raises x15 with 2-0 rail assist; Mini squat with 2-1 rail assist x15; Standing with BUE ball pass side/side x5 each direction; Patient required min VCs for balance stability, including to increase trunk control for less loss of balance with smaller base of support   Standing RLE terminal knee extension green tband x15 with min VCs to increase hold time for increased knee  extension;  Standing calf stretch 20 sec hold x2 bilaterally; Long sitting hamstring stretch 20 sec hold x2 bilaterally;  Standing green ball against wall RLE terminal knee extension 3 sec hold x10 with LLE knee flexion march;  Patient had increased tightness in hamstring/calf during exercise having difficulty achieving terminal knee extension. Patient instructed in stretches for home to improve flexibility;                       PT Education - 12/06/15 1235    Education provided Yes   Education Details LE strengthening/balance exercise   Person(s) Educated Patient   Methods Explanation;Verbal cues   Comprehension Verbalized understanding;Returned demonstration;Verbal cues required             PT Long Term Goals - 11/10/15 1045    PT LONG TERM GOAL #1   Title Patient will be independent in home exercise program to improve strength/mobility for better functional independence with ADLs. by 01/05/16   Time 8   Period Weeks   Status New   PT LONG TERM GOAL #2   Title Patient (> 66 years old) will complete five times sit to stand test in < 15 seconds without HHA indicating an increased LE strength and improved balance. by 01/05/16   Time 8   Period Weeks   Status New   PT LONG TERM GOAL #3   Title Patient will increase 10 meter walk test to >1.26m/s as to improve gait speed for better community ambulation and to reduce fall risk. by 01/05/16   Time 8   Period Weeks   Status New   PT LONG TERM GOAL #4   Title Patient will ascend/descend 4 stairs with 1 rail assist independently forward reciprocally, without loss of balance to improve ability to negotiate steps inside house. by 01/05/16   Time 8   Period Weeks   Status New   PT LONG TERM GOAL #5   Title Patient will tolerate 5 seconds of single leg stance without loss of balance to improve ability to get in and out of shower safely. by 01/05/16   Time 8   Period Weeks   Status New   Additional Long Term Goals    Additional Long Term Goals Yes   PT LONG TERM GOAL #6   Title Patient will increase RLE knee AROM to 0-120 degrees to improve functional ROM for getting in/out of car, negotiating steps etc. by 01/05/16   Time 8   Period Weeks   Status New   PT LONG TERM GOAL #7   Title Patient will increase BLE gross strength to 4+/5 as to improve functional strength for independent gait, increased standing tolerance and increased ADL ability. by 01/05/16  Time 8   Period Weeks   Status New   PT LONG TERM GOAL #8   Title Patient will report a worst pain of 3/10 on VAS in      right knee       to improve tolerance with ADLs and reduced symptoms with activities.    Time 8   Period Weeks   Status New               Plan - 12/06/15 1235    Clinical Impression Statement Instructed patient in BLE strengthening and stability exercise. PT focused on right LE terminal knee extension for increased quad control. Patient did have difficulty with standing on uneven surfaces due to knee stability; Patient would benefit from additional skilled PT intervention to improve knee control and reduce knee pain;    Pt will benefit from skilled therapeutic intervention in order to improve on the following deficits Abnormal gait;Decreased endurance;Hypomobility;Cardiopulmonary status limiting activity;Decreased activity tolerance;Decreased strength;Pain;Increased muscle spasms;Difficulty walking;Decreased mobility;Decreased balance;Decreased range of motion;Impaired flexibility;Decreased safety awareness   Rehab Potential Fair   Clinical Impairments Affecting Rehab Potential positive: motivation, negative: chronic knee pain, chronic smoker which delays healing; Patient's cliinical presentation is evolving as patient is anxious, has limited ROM, doesn't tolerate much manual therapy is a current every day smoker which affects healing potential;    PT Frequency 2x / week   PT Duration 8 weeks   PT Treatment/Interventions  ADLs/Self Care Home Management;Aquatic Therapy;Cryotherapy;Electrical Stimulation;Moist Heat;Balance training;Therapeutic exercise;Therapeutic activities;Functional mobility training;Gait training;Stair training;DME Instruction;Neuromuscular re-education;Patient/family education;Manual techniques;Taping;Energy conservation;Dry needling;Passive range of motion   PT Next Visit Plan work on quad strengthening   [place] continue as given;   Consulted and Agree with Plan of Care Patient        Problem List Patient Active Problem List   Diagnosis Date Noted  . Patella fracture 08/10/2015  . SAH (subarachnoid hemorrhage) ([place]) 05/12/2015    Trotter,Margaret PT, DPT 12/06/2015, 12:37 PM  [place] MAIN [facility] SERVICES [address] [place], [place], [address] Phone: [phone]   Fax:  [phone]  Name: KAYLEA GENSKE MRN: [id number] Date of Birth: [birth year]

## 2015-12-08 ENCOUNTER — Ambulatory Visit: Payer: Medicare Other | Admitting: Physical Therapy

## 2015-12-08 ENCOUNTER — Encounter: Payer: Self-pay | Admitting: Physical Therapy

## 2015-12-08 DIAGNOSIS — M25561 Pain in right knee: Secondary | ICD-10-CM

## 2015-12-08 DIAGNOSIS — M25661 Stiffness of right knee, not elsewhere classified: Secondary | ICD-10-CM

## 2015-12-08 DIAGNOSIS — R262 Difficulty in walking, not elsewhere classified: Secondary | ICD-10-CM | POA: Diagnosis not present

## 2015-12-08 NOTE — Therapy (Signed)
[place] MAIN [facility] SERVICES [address] [place], [place], [address] Phone: [phone]   Fax:  [phone]  Physical Therapy Treatment  Patient Details  Name: Sydney Coleman MRN: [id number] Date of Birth: December 21, 1950 Referring Provider: Mack Guise  Encounter Date: 12/08/2015      PT End of Session - 12/08/15 1314    Visit Number 5   Number of Visits 17   Date for PT Re-Evaluation 01/05/16   Authorization Type Gcode 5   Authorization Time Period 10   PT Start Time 1015   PT Stop Time 1100   PT Time Calculation (min) 45 min   Activity Tolerance Patient tolerated treatment well;Patient limited by pain   Behavior During Therapy [facility] for tasks assessed/performed      Past Medical History  Diagnosis Date  . TIA (transient ischemic attack)   . Chronic headaches   . SAH (subarachnoid hemorrhage) ([place]) 05/11/2015  . Acute spontaneous subarachnoid intracranial hemorrhage (HCC)   . Stroke [facility])     TIA  . Arthritis   . PONV (postoperative nausea and vomiting)   . Difficult intubation     two (2) teeth broken during intubation  . Elevated lipids   . Anxiety     better since pin removed;   Marland Kitchen Hypertension     controlled with medication;   . Cancer of vulva ([place])     >15 year ago;     Past Surgical History  Procedure Laterality Date  . Thyroidectomy, partial    . Placement of breast implants    . Abdominal hysterectomy    . Tonsillectomy    . Breast surgery    . Eye surgery Right     Cataract Extraction  . Joint replacement Left     Partial Hip Replacement  . Orif patella Right 08/10/2015    Procedure: OPEN REDUCTION INTERNAL (ORIF) FIXATION PATELLA TENSION BAND WIRING;  Surgeon: Thornton Park, MD;  Location: [facility] ORS;  Service: Orthopedics;  Laterality: Right;  . Hardware removal Right 10/27/2015    Procedure: HARDWARE REMOVAL;  Surgeon: Thornton Park, MD;  Location: [facility] ORS;  Service: Orthopedics;  Laterality: Right;     There were no vitals filed for this visit.  Visit Diagnosis:  Right knee pain  Knee stiffness, right  Difficulty walking      Subjective Assessment - 12/08/15 1020    Subjective "My leg is just killing me." Patient reports increased soreness and pain in right knee and back of right leg; Patient reports needing to take a pain pill;    Pertinent History Personal factors affecting rehab: chronic knee, current everyday smoker, anxiety somewhat controlled; increased risk for falls;    Limitations Standing;Walking   How long can you sit comfortably? N/A   How long can you stand comfortably? 10 min   How long can you walk comfortably? >300 feet;    Diagnostic tests patient is s/p hardware removal on 10/27/15 with good results;    Patient Stated Goals "I want to be able to walk independently and not feel fearful of falling" Reduce knee pain; Be able to get in/out of car;    Currently in Pain? Yes   Pain Score 9    Pain Location Leg   Pain Orientation Right   Pain Descriptors / Indicators Throbbing;Tightness   Pain Type Chronic pain   Pain Onset More than a month ago      TREATMENT:  Warm up on Nustep Level 1 BUE/BLE x5  min (unbilled);  PT instructed patient in RLE hamstring stretches long sitting 20 sec hold x2; PT also performed passive single knee to chest in hooklying 20 sec hold x2 bilaterally; Patient reports increased knee discomfort with modified piriformis stretch on RLE;  PT identified increased tightness in right IT band; Instructed patient in standing RLE IT Band stretch 20 sec hold x2;  Standing heel off step calf stretch 20 sec hold x2;  Patient required mod VCs with all stretches for correct positioning to improve tissue extensibility; Patient was educated to hold off on strengthening exercise for 1 day to reduce delayed onset muscle soreness;  PT applied TENS, interferential to right knee at tolerated intensity x15 min concurrent with moist heat;  Patient  reports significantly less knee/hip pain following treatment session;                            PT Education - 12/08/15 1314    Education provided Yes   Education Details TENs, plan of care   Person(s) Educated Patient   Methods Explanation;Verbal cues   Comprehension Verbalized understanding;Returned demonstration;Verbal cues required             PT Long Term Goals - 11/10/15 1045    PT LONG TERM GOAL #1   Title Patient will be independent in home exercise program to improve strength/mobility for better functional independence with ADLs. by 01/05/16   Time 8   Period Weeks   Status New   PT LONG TERM GOAL #2   Title Patient (> 9 years old) will complete five times sit to stand test in < 15 seconds without HHA indicating an increased LE strength and improved balance. by 01/05/16   Time 8   Period Weeks   Status New   PT LONG TERM GOAL #3   Title Patient will increase 10 meter walk test to >1.2m/s as to improve gait speed for better community ambulation and to reduce fall risk. by 01/05/16   Time 8   Period Weeks   Status New   PT LONG TERM GOAL #4   Title Patient will ascend/descend 4 stairs with 1 rail assist independently forward reciprocally, without loss of balance to improve ability to negotiate steps inside house. by 01/05/16   Time 8   Period Weeks   Status New   PT LONG TERM GOAL #5   Title Patient will tolerate 5 seconds of single leg stance without loss of balance to improve ability to get in and out of shower safely. by 01/05/16   Time 8   Period Weeks   Status New   Additional Long Term Goals   Additional Long Term Goals Yes   PT LONG TERM GOAL #6   Title Patient will increase RLE knee AROM to 0-120 degrees to improve functional ROM for getting in/out of car, negotiating steps etc. by 01/05/16   Time 8   Period Weeks   Status New   PT LONG TERM GOAL #7   Title Patient will increase BLE gross strength to 4+/5 as to improve functional  strength for independent gait, increased standing tolerance and increased ADL ability. by 01/05/16   Time 8   Period Weeks   Status New   PT LONG TERM GOAL #8   Title Patient will report a worst pain of 3/10 on VAS in      right knee       to improve tolerance with ADLs and  reduced symptoms with activities.    Time 8   Period Weeks   Status New               Plan - 12/08/15 1315    Clinical Impression Statement PT instructed patient in BLE stretches; Patient exhibits increased tightness in right hamstrings and IT band. PT then applied TENs with moist heat for less discomfort. Patient reports significant pain relief following treatment session. She would benefit from additional skilled PT intervention to improve LE strength and reduce knee pain;    Pt will benefit from skilled therapeutic intervention in order to improve on the following deficits Abnormal gait;Decreased endurance;Hypomobility;Cardiopulmonary status limiting activity;Decreased activity tolerance;Decreased strength;Pain;Increased muscle spasms;Difficulty walking;Decreased mobility;Decreased balance;Decreased range of motion;Impaired flexibility;Decreased safety awareness   Rehab Potential Fair   Clinical Impairments Affecting Rehab Potential positive: motivation, negative: chronic knee pain, chronic smoker which delays healing; Patient's cliinical presentation is evolving as patient is anxious, has limited ROM, doesn't tolerate much manual therapy is a current every day smoker which affects healing potential;    PT Frequency 2x / week   PT Duration 8 weeks   PT Treatment/Interventions ADLs/Self Care Home Management;Aquatic Therapy;Cryotherapy;Electrical Stimulation;Moist Heat;Balance training;Therapeutic exercise;Therapeutic activities;Functional mobility training;Gait training;Stair training;DME Instruction;Neuromuscular re-education;Patient/family education;Manual techniques;Taping;Energy conservation;Dry needling;Passive  range of motion   PT Next Visit Plan work on quad strengthening   [place] continue as given;   Consulted and Agree with Plan of Care Patient        Problem List Patient Active Problem List   Diagnosis Date Noted  . Patella fracture 08/10/2015  . SAH (subarachnoid hemorrhage) ([place]) 05/12/2015    Trotter,Margaret PT, DPT 12/08/2015, 1:17 PM  [place] MAIN [facility] SERVICES [address] [place], [place], [address] Phone: [phone]   Fax:  [phone]  Name: Sydney Coleman MRN: [id number] Date of Birth: 10-22-1950

## 2015-12-11 DIAGNOSIS — Z8673 Personal history of transient ischemic attack (TIA), and cerebral infarction without residual deficits: Secondary | ICD-10-CM | POA: Diagnosis not present

## 2015-12-11 DIAGNOSIS — M6281 Muscle weakness (generalized): Secondary | ICD-10-CM | POA: Diagnosis not present

## 2015-12-12 ENCOUNTER — Ambulatory Visit: Payer: Medicare Other | Admitting: Physical Therapy

## 2015-12-12 ENCOUNTER — Encounter: Payer: Self-pay | Admitting: Physical Therapy

## 2015-12-12 DIAGNOSIS — R262 Difficulty in walking, not elsewhere classified: Secondary | ICD-10-CM | POA: Diagnosis not present

## 2015-12-12 DIAGNOSIS — M25661 Stiffness of right knee, not elsewhere classified: Secondary | ICD-10-CM

## 2015-12-12 DIAGNOSIS — M25561 Pain in right knee: Secondary | ICD-10-CM

## 2015-12-12 NOTE — Therapy (Signed)
[place] MAIN [facility] SERVICES [address] [place], [place], [address] Phone: [phone]   Fax:  [phone]  Physical Therapy Treatment  Patient Details  Name: Sydney Coleman MRN: [id number] Date of Birth: 1950-11-05 Referring Provider: Mack Guise MD  Encounter Date: 12/12/2015      PT End of Session - 12/12/15 1629    Visit Number 6   Number of Visits 17   Date for PT Re-Evaluation 01/05/16   Authorization Type Gcode 6   Authorization Time Period 10   PT Start Time 1530   PT Stop Time 1610   PT Time Calculation (min) 40 min   Activity Tolerance Patient limited by lethargy;Patient limited by fatigue   Behavior During Therapy Flat affect      Past Medical History  Diagnosis Date  . TIA (transient ischemic attack)   . Chronic headaches   . SAH (subarachnoid hemorrhage) ([place]) 05/11/2015  . Acute spontaneous subarachnoid intracranial hemorrhage (HCC)   . Stroke [facility])     TIA  . Arthritis   . PONV (postoperative nausea and vomiting)   . Difficult intubation     two (2) teeth broken during intubation  . Elevated lipids   . Anxiety     better since pin removed;   Marland Kitchen Hypertension     controlled with medication;   . Cancer of vulva ([place])     >15 year ago;     Past Surgical History  Procedure Laterality Date  . Thyroidectomy, partial    . Placement of breast implants    . Abdominal hysterectomy    . Tonsillectomy    . Breast surgery    . Eye surgery Right     Cataract Extraction  . Joint replacement Left     Partial Hip Replacement  . Orif patella Right 08/10/2015    Procedure: OPEN REDUCTION INTERNAL (ORIF) FIXATION PATELLA TENSION BAND WIRING;  Surgeon: Thornton Park, MD;  Location: [facility] ORS;  Service: Orthopedics;  Laterality: Right;  . Hardware removal Right 10/27/2015    Procedure: HARDWARE REMOVAL;  Surgeon: Thornton Park, MD;  Location: [facility] ORS;  Service: Orthopedics;  Laterality: Right;    There were no vitals  filed for this visit.  Visit Diagnosis:  Right knee pain  Knee stiffness, right  Difficulty walking      Subjective Assessment - 12/12/15 1534    Subjective Patient reports getting into an argument with her husband. She reports not getting a lot of sleep due to not getting along with her husband. She reports that her knee is better but is having some ankle discomfort.    Pertinent History Personal factors affecting rehab: chronic knee, current everyday smoker, anxiety somewhat controlled; increased risk for falls;    Limitations Standing;Walking   How long can you sit comfortably? N/A   How long can you stand comfortably? 10 min   How long can you walk comfortably? >300 feet;    Diagnostic tests patient is s/p hardware removal on 10/27/15 with good results;    Patient Stated Goals "I want to be able to walk independently and not feel fearful of falling" Reduce knee pain; Be able to get in/out of car;    Currently in Pain? Yes   Pain Score 2    Pain Location Knee   Pain Orientation Right   Pain Descriptors / Indicators Tightness   Pain Type Chronic pain   Pain Onset More than a month ago  TREATMENT: Warm up on recumbent bike x5 min (Unbilled)  Quantum Leg press, BLE 75# x15; RLE plate 30# [id number] with min VCs to increase right terminal knee extension; Leg press, BLE heel raises 75# 2x10 with mod VCs to maintain knee extension for better calf strengthening with heel raises; Patient required min Vcs to slow down LE movement for better strengthening;  Applied TENs to right knee, interferential at tolerated intensity x15 min concurrent with moist heat; Patient reports no pain after treatment session;                            PT Education - 12/12/15 1628    Education provided Yes   Education Details TENs, exercise   Person(s) Educated Patient   Methods Explanation;Verbal cues   Comprehension Verbalized understanding;Returned demonstration;Verbal cues  required             PT Long Term Goals - 11/10/15 1045    PT LONG TERM GOAL #1   Title Patient will be independent in home exercise program to improve strength/mobility for better functional independence with ADLs. by 01/05/16   Time 8   Period Weeks   Status New   PT LONG TERM GOAL #2   Title Patient (> 59 years old) will complete five times sit to stand test in < 15 seconds without HHA indicating an increased LE strength and improved balance. by 01/05/16   Time 8   Period Weeks   Status New   PT LONG TERM GOAL #3   Title Patient will increase 10 meter walk test to >1.39m/s as to improve gait speed for better community ambulation and to reduce fall risk. by 01/05/16   Time 8   Period Weeks   Status New   PT LONG TERM GOAL #4   Title Patient will ascend/descend 4 stairs with 1 rail assist independently forward reciprocally, without loss of balance to improve ability to negotiate steps inside house. by 01/05/16   Time 8   Period Weeks   Status New   PT LONG TERM GOAL #5   Title Patient will tolerate 5 seconds of single leg stance without loss of balance to improve ability to get in and out of shower safely. by 01/05/16   Time 8   Period Weeks   Status New   Additional Long Term Goals   Additional Long Term Goals Yes   PT LONG TERM GOAL #6   Title Patient will increase RLE knee AROM to 0-120 degrees to improve functional ROM for getting in/out of car, negotiating steps etc. by 01/05/16   Time 8   Period Weeks   Status New   PT LONG TERM GOAL #7   Title Patient will increase BLE gross strength to 4+/5 as to improve functional strength for independent gait, increased standing tolerance and increased ADL ability. by 01/05/16   Time 8   Period Weeks   Status New   PT LONG TERM GOAL #8   Title Patient will report a worst pain of 3/10 on VAS in      right knee       to improve tolerance with ADLs and reduced symptoms with activities.    Time 8   Period Weeks   Status New                Plan - 12/12/15 1647    Clinical Impression Statement Patient presents to therapy late and very fatigued. She reports  getting into an argument and not sleeping well the night before. Patient was slurring her words during history intake and had a very flat affect. She had difficulty following instruction on leg press machine. PT finished with TENs to reduce knee discomfort. Ended session early due to flat affect and difficulty following instruction. Patient would benefit from additional skilled PT intervention to reduce knee pain and increase strength;    Pt will benefit from skilled therapeutic intervention in order to improve on the following deficits Abnormal gait;Decreased endurance;Hypomobility;Cardiopulmonary status limiting activity;Decreased activity tolerance;Decreased strength;Pain;Increased muscle spasms;Difficulty walking;Decreased mobility;Decreased balance;Decreased range of motion;Impaired flexibility;Decreased safety awareness   Rehab Potential Fair   Clinical Impairments Affecting Rehab Potential positive: motivation, negative: chronic knee pain, chronic smoker which delays healing; Patient's cliinical presentation is evolving as patient is anxious, has limited ROM, doesn't tolerate much manual therapy is a current every day smoker which affects healing potential;    PT Frequency 2x / week   PT Duration 8 weeks   PT Treatment/Interventions ADLs/Self Care Home Management;Aquatic Therapy;Cryotherapy;Electrical Stimulation;Moist Heat;Balance training;Therapeutic exercise;Therapeutic activities;Functional mobility training;Gait training;Stair training;DME Instruction;Neuromuscular re-education;Patient/family education;Manual techniques;Taping;Energy conservation;Dry needling;Passive range of motion   PT Next Visit Plan work on quad strengthening   [place] continue as given;   Consulted and Agree with Plan of Care Patient        Problem List Patient  Active Problem List   Diagnosis Date Noted  . Patella fracture 08/10/2015  . SAH (subarachnoid hemorrhage) ([place]) 05/12/2015    Ivet Guerrieri PT, DPT 12/12/2015, 5:01 PM  [place] MAIN [facility] SERVICES [address] [place], [place], [address] Phone: [phone]   Fax:  [phone]  Name: SHERNA ZAWADZKI MRN: [id number] Date of Birth: 05/03/1951

## 2015-12-13 ENCOUNTER — Ambulatory Visit: Payer: Medicare Other | Admitting: Physical Therapy

## 2015-12-15 ENCOUNTER — Encounter: Payer: Medicare Other | Admitting: Physical Therapy

## 2015-12-19 DIAGNOSIS — H18411 Arcus senilis, right eye: Secondary | ICD-10-CM | POA: Diagnosis not present

## 2015-12-19 DIAGNOSIS — H26491 Other secondary cataract, right eye: Secondary | ICD-10-CM | POA: Diagnosis not present

## 2015-12-19 DIAGNOSIS — H18412 Arcus senilis, left eye: Secondary | ICD-10-CM | POA: Diagnosis not present

## 2015-12-19 DIAGNOSIS — H02839 Dermatochalasis of unspecified eye, unspecified eyelid: Secondary | ICD-10-CM | POA: Diagnosis not present

## 2015-12-20 ENCOUNTER — Encounter: Payer: Self-pay | Admitting: Physical Therapy

## 2015-12-20 ENCOUNTER — Ambulatory Visit: Payer: Medicare Other | Attending: Orthopedic Surgery | Admitting: Physical Therapy

## 2015-12-20 DIAGNOSIS — R262 Difficulty in walking, not elsewhere classified: Secondary | ICD-10-CM | POA: Insufficient documentation

## 2015-12-20 DIAGNOSIS — M25661 Stiffness of right knee, not elsewhere classified: Secondary | ICD-10-CM | POA: Diagnosis not present

## 2015-12-20 DIAGNOSIS — M25561 Pain in right knee: Secondary | ICD-10-CM | POA: Diagnosis not present

## 2015-12-21 NOTE — Therapy (Signed)
[place] [facility] SERVICES [address] [place], [place], [address] Phone: [phone]   Fax:  [phone]  Physical Therapy Treatment Progress Note 11/10/15 to 12/20/15  Patient Details  Name: Sydney Coleman MRN: [id number] Date of Birth: April 02, 1951 Referring Provider: Mack Guise  Encounter Date: 12/20/2015      PT End of Session - 12/20/15 1041    Visit Number 7   Number of Visits 17   Date for PT Re-Evaluation 01/05/16   Authorization Type Gcode 1   Authorization Time Period 10   PT Start Time 1030   PT Stop Time 1115   PT Time Calculation (min) 45 min   Activity Tolerance Patient tolerated treatment well   Behavior During Therapy [facility] for tasks assessed/performed      Past Medical History  Diagnosis Date  . TIA (transient ischemic attack)   . Chronic headaches   . SAH (subarachnoid hemorrhage) ([place]) 05/11/2015  . Acute spontaneous subarachnoid intracranial hemorrhage (HCC)   . Stroke [facility])     TIA  . Arthritis   . PONV (postoperative nausea and vomiting)   . Difficult intubation     two (2) teeth broken during intubation  . Elevated lipids   . Anxiety     better since pin removed;   Marland Kitchen Hypertension     controlled with medication;   . Cancer of vulva ([place])     >15 year ago;     Past Surgical History  Procedure Laterality Date  . Thyroidectomy, partial    . Placement of breast implants    . Abdominal hysterectomy    . Tonsillectomy    . Breast surgery    . Eye surgery Right     Cataract Extraction  . Joint replacement Left     Partial Hip Replacement  . Orif patella Right 08/10/2015    Procedure: OPEN REDUCTION INTERNAL (ORIF) FIXATION PATELLA TENSION BAND WIRING;  Surgeon: Thornton Park, MD;  Location: [facility] ORS;  Service: Orthopedics;  Laterality: Right;  . Hardware removal Right 10/27/2015    Procedure: HARDWARE REMOVAL;  Surgeon: Thornton Park, MD;  Location: [facility] ORS;  Service: Orthopedics;  Laterality:  Right;    There were no vitals filed for this visit.  Visit Diagnosis:  Right knee pain  Knee stiffness, right  Difficulty walking      Subjective Assessment - 12/20/15 1035    Subjective Patient reports going to the beach this weekend: she reports having increased  knee "Popping" and discomfort on outside of knee; Patient reports getting into water and getting wet; She reports less difficulty walking on beach as compared to last time she was there.   Pertinent History Personal factors affecting rehab: chronic knee, current everyday smoker, anxiety somewhat controlled; increased risk for falls;    Limitations Standing;Walking   How long can you sit comfortably? N/A   How long can you stand comfortably? 10 min   How long can you walk comfortably? >300 feet;    Diagnostic tests patient is s/p hardware removal on 10/27/15 with good results;    Patient Stated Goals "I want to be able to walk independently and not feel fearful of falling" Reduce knee pain; Be able to get in/out of car;    Currently in Pain? Yes   Pain Score 2    Pain Location Knee   Pain Orientation Right   Pain Descriptors / Indicators Tightness;Sore;Aching   Pain Type Chronic pain   Pain Onset More than a  month ago            [facility] PT Assessment - 12/21/15 0001    Observation/Other Assessments   Lower Extremity Functional Scale  49/80 (the lower the score the greater the disability, improved from initial eval on 11/10/15 which was 21/80   AROM   Right Knee Extension -2   Right Knee Flexion 118   Standardized Balance Assessment   Five times sit to stand comments  11 sec without HHA; low risk for falls;    10 Meter Walk 1.05 m/s without AD; community ambulator; improved from initial eval on 11/10/15 which was 0.769 m/s           TREATMENT: Warm up on recumbent bike with BLE pumping forward/backward x4 min (unbilled);  Quantum Leg press, BLE 75# 2x15; with min VCs to increase right terminal knee  extension;  Ascend/descend 4 steps, x2 reps with 1 rail, forward reciprocal ascending, forward non-reciprocal descending;  Instructed patient in 10 meter walk, 5 times sit<>Stand, LEFs and assessed knee ROM/strength; Patient required min Vcs for correct activity technique; Patient demonstrates improved gait and transfer ability; She also demonstrates improved knee ROM;  Resisted weighted gait, 12.5# forward/backward, side stepping x2 way x3 laps each;  Patient required min VCs for balance stability, including to increase trunk control for less loss of balance with smaller base of support                      PT Education - 12/20/15 1116    Education provided Yes   Education Details progress towards goals, exercise   Person(s) Educated Patient   Methods Explanation;Verbal cues   Comprehension Verbalized understanding;Returned demonstration;Verbal cues required             PT Long Term Goals - 12/20/15 1042    PT LONG TERM GOAL #1   Title Patient will be independent in home exercise program to improve strength/mobility for better functional independence with ADLs. by 01/05/16   Time 8   Period Weeks   Status On-going   PT LONG TERM GOAL #2   Title Patient (> 24 years old) will complete five times sit to stand test in < 15 seconds without HHA indicating an increased LE strength and improved balance. by 01/05/16   Time 8   Period Weeks   Status Achieved   PT LONG TERM GOAL #3   Title Patient will increase 10 meter walk test to >1.74ms as to improve gait speed for better community ambulation and to reduce fall risk. by 01/05/16   Time 8   Period Weeks   Status Achieved   PT LONG TERM GOAL #4   Title Patient will ascend/descend 4 stairs with 1 rail assist independently forward reciprocally, without loss of balance to improve ability to negotiate steps inside house. by 01/05/16   Time 8   Period Weeks   Status Partially Met   PT LONG TERM GOAL #5   Title Patient  will tolerate 5 seconds of single leg stance without loss of balance to improve ability to get in and out of shower safely. by 01/05/16   Time 8   Period Weeks   Status New   PT LONG TERM GOAL #6   Title Patient will increase RLE knee AROM to 0-120 degrees to improve functional ROM for getting in/out of car, negotiating steps etc. by 01/05/16   Time 8   Period Weeks   Status Partially Met   PT LONG  TERM GOAL #7   Title Patient will increase BLE gross strength to 4+/5 as to improve functional strength for independent gait, increased standing tolerance and increased ADL ability. by 01/05/16   Time 8   Period Weeks   Status Partially Met   PT LONG TERM GOAL #8   Title Patient will report a worst pain of 3/10 on VAS in      right knee       to improve tolerance with ADLs and reduced symptoms with activities.    Baseline worst pain 5-6/10   Time 8   Period Weeks   Status Partially Met               Plan - 12/21/15 1724    Clinical Impression Statement Patient instructed in outcome measures to assess progress towards goals. She does continue to have a little stiffness in knee with ROM measurement. Patient is able to ambulate better with faster walking speed. She does better with sit<>stand transfer as compared to initial eval.  Patient would benefit from additional skilled PT intervention to improve LE ROM/strength and return to PLOF.    Pt will benefit from skilled therapeutic intervention in order to improve on the following deficits Abnormal gait;Decreased endurance;Hypomobility;Cardiopulmonary status limiting activity;Decreased activity tolerance;Decreased strength;Pain;Increased muscle spasms;Difficulty walking;Decreased mobility;Decreased balance;Decreased range of motion;Impaired flexibility;Decreased safety awareness   Rehab Potential Fair   Clinical Impairments Affecting Rehab Potential positive: motivation, negative: chronic knee pain, chronic smoker which delays healing; Patient's  cliinical presentation is evolving as patient is anxious, has limited ROM, doesn't tolerate much manual therapy is a current every day smoker which affects healing potential;    PT Frequency 2x / week   PT Duration 8 weeks   PT Treatment/Interventions ADLs/Self Care Home Management;Aquatic Therapy;Cryotherapy;Electrical Stimulation;Moist Heat;Balance training;Therapeutic exercise;Therapeutic activities;Functional mobility training;Gait training;Stair training;DME Instruction;Neuromuscular re-education;Patient/family education;Manual techniques;Taping;Energy conservation;Dry needling;Passive range of motion   PT Next Visit Plan work on quad strengthening   PT [place] continue as given;   Consulted and Agree with Plan of Care Patient          G-Codes - 31-Dec-2015 1726    Functional Assessment Tool Used 5 times sit<>stand, 10 meter walk, clinical judgement;    Functional Limitation Mobility: Walking and moving around   Mobility: Walking and Moving Around Current Status [phone]) At least 20 percent but less than 40 percent impaired, limited or restricted   Mobility: Walking and Moving Around Goal Status [phone]) At least 1 percent but less than 20 percent impaired, limited or restricted      Problem List Patient Active Problem List   Diagnosis Date Noted  . Patella fracture 08/10/2015  . SAH (subarachnoid hemorrhage) ([place]) 05/12/2015    Trotter,Margaret PT, DPT 12/21/2015, 5:26 PM  [place] MAIN [facility] SERVICES [address] [place], [place], [address] Phone: [phone]   Fax:  [phone]  Name: Sydney Coleman MRN: [id number] Date of Birth: [birth year]

## 2015-12-22 ENCOUNTER — Ambulatory Visit: Payer: Medicare Other | Admitting: Physical Therapy

## 2015-12-22 ENCOUNTER — Other Ambulatory Visit (HOSPITAL_COMMUNITY): Payer: Self-pay | Admitting: Interventional Radiology

## 2015-12-22 ENCOUNTER — Encounter: Payer: Self-pay | Admitting: Physical Therapy

## 2015-12-22 DIAGNOSIS — M25661 Stiffness of right knee, not elsewhere classified: Secondary | ICD-10-CM | POA: Diagnosis not present

## 2015-12-22 DIAGNOSIS — I609 Nontraumatic subarachnoid hemorrhage, unspecified: Secondary | ICD-10-CM

## 2015-12-22 DIAGNOSIS — M25561 Pain in right knee: Secondary | ICD-10-CM | POA: Diagnosis not present

## 2015-12-22 DIAGNOSIS — R262 Difficulty in walking, not elsewhere classified: Secondary | ICD-10-CM

## 2015-12-22 DIAGNOSIS — I729 Aneurysm of unspecified site: Secondary | ICD-10-CM

## 2015-12-22 NOTE — Therapy (Signed)
[place] MAIN [facility] SERVICES [address] [place], [place], [address] Phone: [phone]   Fax:  [phone]  Physical Therapy Treatment  Patient Details  Name: Sydney Coleman MRN: [id number] Date of Birth: 12/14/1950 Referring Provider: Mack Guise  Encounter Date: 12/22/2015      PT End of Session - 12/22/15 1032    Visit Number 8   Number of Visits 17   Date for PT Re-Evaluation 01/05/16   Authorization Type Gcode 2   Authorization Time Period 10   PT Start Time 1027   PT Stop Time 1115   PT Time Calculation (min) 48 min   Activity Tolerance Patient tolerated treatment well;No increased pain   Behavior During Therapy [facility] for tasks assessed/performed      Past Medical History  Diagnosis Date  . TIA (transient ischemic attack)   . Chronic headaches   . SAH (subarachnoid hemorrhage) ([place]) 05/11/2015  . Acute spontaneous subarachnoid intracranial hemorrhage (HCC)   . Stroke [facility])     TIA  . Arthritis   . PONV (postoperative nausea and vomiting)   . Difficult intubation     two (2) teeth broken during intubation  . Elevated lipids   . Anxiety     better since pin removed;   Marland Kitchen Hypertension     controlled with medication;   . Cancer of vulva ([place])     >15 year ago;     Past Surgical History  Procedure Laterality Date  . Thyroidectomy, partial    . Placement of breast implants    . Abdominal hysterectomy    . Tonsillectomy    . Breast surgery    . Eye surgery Right     Cataract Extraction  . Joint replacement Left     Partial Hip Replacement  . Orif patella Right 08/10/2015    Procedure: OPEN REDUCTION INTERNAL (ORIF) FIXATION PATELLA TENSION BAND WIRING;  Surgeon: Thornton Park, MD;  Location: [facility] ORS;  Service: Orthopedics;  Laterality: Right;  . Hardware removal Right 10/27/2015    Procedure: HARDWARE REMOVAL;  Surgeon: Thornton Park, MD;  Location: [facility] ORS;  Service: Orthopedics;  Laterality: Right;    There  were no vitals filed for this visit.  Visit Diagnosis:  Right knee pain  Knee stiffness, right  Difficulty walking      Subjective Assessment - 12/22/15 1031    Subjective Patient reports, "My knee feels pretty good but my ankle keeps bothering me." Patient reports having intermittent ankle pain and isn't sure why its bothering her.    Pertinent History Personal factors affecting rehab: chronic knee, current everyday smoker, anxiety somewhat controlled; increased risk for falls;    Limitations Standing;Walking   How long can you sit comfortably? N/A   How long can you stand comfortably? 10 min   How long can you walk comfortably? >300 feet;    Diagnostic tests patient is s/p hardware removal on 10/27/15 with good results;    Patient Stated Goals "I want to be able to walk independently and not feel fearful of falling" Reduce knee pain; Be able to get in/out of car;    Currently in Pain? Yes   Pain Score 6    Pain Location Ankle   Pain Orientation Right   Pain Descriptors / Indicators Tightness;Aching   Pain Type Acute pain      TREATMENT:  Warm up on Recumbent bike BLE x4 min (unbilled);  PT assessed right ankle, patient has increased pain with palpation  to joint and increased discomfort with PA, AP ankle mobs;PT recommended patient follow up with MD regarding ankle discomfort;  PT performed passive hamstring stretch with ankle DF for calf stretch 20 sec hold x3 reps; PT performed grade II-III PA, AP mobs of tibia on femur in sitting, 10 sec bouts x5 each with PROM into overpressure for better joint mobility; PROM of RLE knee flexion/extension x5 reps in supine; Bridges with BLE x15 reps with cues to increase right knee flexion; Grade II-III RLE patella mobs in multiple directions x3-4 min to tolerance; patient exhibits good quad set after manual therapy;   HOIST hamstring curl, plate #4, 7T24; Patient required mod VCs for correct technique and to avoid sliding out of machine;   HOIST leg extension BLE plate #1, 5Y09 with min VCs to increase terminal knee extension for better quad strengthening;   Standing on airex x2: Heel/toe raises x15; Mini squats without rail assist x15; Alternate toe taps to 8 inch step x10 bilaterally with 2-1 rail assist;  Forward lunges on airex x2, x10 reps bilaterally with 2 rail assist; SLS with star toe taps x2 reps each LE with min A for balance;  Patient required min VCs for balance stability, including to increase trunk control for less loss of balance with smaller base of support                            PT Education - 12/22/15 1706    Education provided Yes   Education Details exercise, strengthening, balance;    Person(s) Educated Patient   Methods Explanation;Verbal cues   Comprehension Verbalized understanding;Returned demonstration;Verbal cues required             PT Long Term Goals - 12/20/15 1042    PT LONG TERM GOAL #1   Title Patient will be independent in home exercise program to improve strength/mobility for better functional independence with ADLs. by 01/05/16   Time 8   Period Weeks   Status On-going   PT LONG TERM GOAL #2   Title Patient (> 45 years old) will complete five times sit to stand test in < 15 seconds without HHA indicating an increased LE strength and improved balance. by 01/05/16   Time 8   Period Weeks   Status Achieved   PT LONG TERM GOAL #3   Title Patient will increase 10 meter walk test to >1.44ms as to improve gait speed for better community ambulation and to reduce fall risk. by 01/05/16   Time 8   Period Weeks   Status Achieved   PT LONG TERM GOAL #4   Title Patient will ascend/descend 4 stairs with 1 rail assist independently forward reciprocally, without loss of balance to improve ability to negotiate steps inside house. by 01/05/16   Time 8   Period Weeks   Status Partially Met   PT LONG TERM GOAL #5   Title Patient will tolerate 5 seconds of  single leg stance without loss of balance to improve ability to get in and out of shower safely. by 01/05/16   Time 8   Period Weeks   Status New   PT LONG TERM GOAL #6   Title Patient will increase RLE knee AROM to 0-120 degrees to improve functional ROM for getting in/out of car, negotiating steps etc. by 01/05/16   Time 8   Period Weeks   Status Partially Met   PT LONG TERM GOAL #7  Title Patient will increase BLE gross strength to 4+/5 as to improve functional strength for independent gait, increased standing tolerance and increased ADL ability. by 01/05/16   Time 8   Period Weeks   Status Partially Met   PT LONG TERM GOAL #8   Title Patient will report a worst pain of 3/10 on VAS in      right knee       to improve tolerance with ADLs and reduced symptoms with activities.    Baseline worst pain 5-6/10   Time 8   Period Weeks   Status Partially Met               Plan - 12/22/15 1706    Clinical Impression Statement PT performed manual therapy to improve knee joint mobility; Patient tolerated joint mobs well; She was able to demonstrate good quad set; PT advanced BLE strengthening. Patient able to tolerate advanced exercise well without increase in pain; she did report increased difficulty with stance activities due to ankle discomfort. PT recommended patient follow up with MD regarding ankle pain. Patient would benefit from additional skilled PT intervention to improve LE strength, balance and gait safety;    Pt will benefit from skilled therapeutic intervention in order to improve on the following deficits Abnormal gait;Decreased endurance;Hypomobility;Cardiopulmonary status limiting activity;Decreased activity tolerance;Decreased strength;Pain;Increased muscle spasms;Difficulty walking;Decreased mobility;Decreased balance;Decreased range of motion;Impaired flexibility;Decreased safety awareness   Rehab Potential Fair   Clinical Impairments Affecting Rehab Potential positive:  motivation, negative: chronic knee pain, chronic smoker which delays healing; Patient's cliinical presentation is evolving as patient is anxious, has limited ROM, doesn't tolerate much manual therapy is a current every day smoker which affects healing potential;    PT Frequency 2x / week   PT Duration 8 weeks   PT Treatment/Interventions ADLs/Self Care Home Management;Aquatic Therapy;Cryotherapy;Electrical Stimulation;Moist Heat;Balance training;Therapeutic exercise;Therapeutic activities;Functional mobility training;Gait training;Stair training;DME Instruction;Neuromuscular re-education;Patient/family education;Manual techniques;Taping;Energy conservation;Dry needling;Passive range of motion   PT Next Visit Plan work on quad strengthening   [place] continue as given;   Consulted and Agree with Plan of Care Patient        Problem List Patient Active Problem List   Diagnosis Date Noted  . Patella fracture 08/10/2015  . SAH (subarachnoid hemorrhage) ([place]) 05/12/2015    Trotter,Margaret PT, DPT 12/22/2015, 5:14 PM  [place] MAIN [facility] SERVICES [address] [place], [place], [address] Phone: [phone]   Fax:  [phone]  Name: Sydney Coleman MRN: [id number] Date of Birth: Feb 09, 1951

## 2015-12-26 ENCOUNTER — Encounter: Payer: Medicare Other | Admitting: Physical Therapy

## 2015-12-26 DIAGNOSIS — M25571 Pain in right ankle and joints of right foot: Secondary | ICD-10-CM | POA: Diagnosis not present

## 2015-12-27 ENCOUNTER — Encounter: Payer: Medicare Other | Admitting: Physical Therapy

## 2015-12-29 ENCOUNTER — Ambulatory Visit: Payer: Medicare Other | Admitting: Physical Therapy

## 2015-12-29 ENCOUNTER — Encounter: Payer: Self-pay | Admitting: Physical Therapy

## 2015-12-29 DIAGNOSIS — M25661 Stiffness of right knee, not elsewhere classified: Secondary | ICD-10-CM

## 2015-12-29 DIAGNOSIS — R262 Difficulty in walking, not elsewhere classified: Secondary | ICD-10-CM

## 2015-12-29 DIAGNOSIS — M25561 Pain in right knee: Secondary | ICD-10-CM | POA: Diagnosis not present

## 2015-12-29 NOTE — Therapy (Signed)
[place] MAIN [facility] SERVICES [address] [place], [place], [address] Phone: [phone]   Fax:  [phone]  Physical Therapy Treatment  Patient Details  Name: Sydney Coleman MRN: [id number] Date of Birth: 10/18/1950 Referring Provider: Mack Guise  Encounter Date: 12/29/2015      PT End of Session - 12/29/15 1041    Visit Number 9   Number of Visits 17   Date for PT Re-Evaluation 01/05/16   Authorization Type Gcode 3   Authorization Time Period 10   PT Start Time 1030   PT Stop Time 1115   PT Time Calculation (min) 45 min   Activity Tolerance Patient tolerated treatment well;No increased pain   Behavior During Therapy [facility] for tasks assessed/performed      Past Medical History  Diagnosis Date  . TIA (transient ischemic attack)   . Chronic headaches   . SAH (subarachnoid hemorrhage) ([place]) 05/11/2015  . Acute spontaneous subarachnoid intracranial hemorrhage (HCC)   . Stroke [facility])     TIA  . Arthritis   . PONV (postoperative nausea and vomiting)   . Difficult intubation     two (2) teeth broken during intubation  . Elevated lipids   . Anxiety     better since pin removed;   Mar[place] Kitchen Hypertension     controlled with medication;   . Cancer of vulva ([place])     >15 year ago;     Past Surgical History  Procedure Laterality Date  . Thyroidectomy, partial    . Placement of breast implants    . Abdominal hysterectomy    . Tonsillectomy    . Breast surgery    . Eye surgery Right     Cataract Extraction  . Joint replacement Left     Partial Hip Replacement  . Orif patella Right 08/10/2015    Procedure: OPEN REDUCTION INTERNAL (ORIF) FIXATION PATELLA TENSION BAND WIRING;  Surgeon: Thornton Park, MD;  Location: [facility] ORS;  Service: Orthopedics;  Laterality: Right;  . Hardware removal Right 10/27/2015    Procedure: HARDWARE REMOVAL;  Surgeon: Thornton Park, MD;  Location: [facility] ORS;  Service: Orthopedics;  Laterality: Right;    There  were no vitals filed for this visit.  Visit Diagnosis:  Right knee pain  Knee stiffness, right  Difficulty walking      Subjective Assessment - 12/29/15 1035    Subjective Patient reports getting X-ray of ankle but hasn't heard back about them. She reports still having right knee pain but that it isn't too bad. Reports compliance with HEP;    Pertinent History Personal factors affecting rehab: chronic knee, current everyday smoker, anxiety somewhat controlled; increased risk for falls;    Limitations Standing;Walking   How long can you sit comfortably? N/A   How long can you stand comfortably? 10 min   How long can you walk comfortably? >300 feet;    Diagnostic tests patient is s/p hardware removal on 10/27/15 with good results;    Patient Stated Goals "I want to be able to walk independently and not feel fearful of falling" Reduce knee pain; Be able to get in/out of car;    Currently in Pain? Yes   Pain Score 2    Pain Location Knee   Pain Orientation Right   Pain Descriptors / Indicators Aching;Sore   Pain Type Chronic pain        TREATMENT:  Warm up on Recumbent bike BLE x4 min (unbilled), 2 min forward, 2 min backward;  Quantum Leg press, BLE 90# 2x10; with min VCs to increase right terminal knee extension;  PT performed passive hamstring stretch with ankle DF for calf stretch 20 sec hold x3 reps; PT performed grade II-III PA, AP mobs of tibia on femur in sitting, 10 sec bouts x6 each with PROM into overpressure for better joint mobility; PROM of RLE knee flexion/extension x5 reps in supine; Bridges with alternate LE leg kick working on single leg bridge x10 each LE; Grade II-III RLE patella mobs in multiple directions x3  min to tolerance; patient exhibits good quad set after manual therapy;   Resisted weighted gait 7.5#, forward/backward step over 6 inch step x5 reps; Resisted weighted gait, side step over 6 inch step x10 reps each direction with min A for balance and  mod VCs for increased weight shift, 7.5#   RLE terminal knee extension against ball on wall 3 sec hold x10 reps;  Patient required min VCs for balance stability, including to increase trunk control for less loss of balance with smaller base of support                                  PT Education - 12/29/15 1041    Education provided Yes   Education Details exercise, strengthening   Person(s) Educated Patient   Methods Explanation;Verbal cues   Comprehension Verbalized understanding;Returned demonstration;Verbal cues required             PT Long Term Goals - 12/20/15 1042    PT LONG TERM GOAL #1   Title Patient will be independent in home exercise program to improve strength/mobility for better functional independence with ADLs. by 01/05/16   Time 8   Period Weeks   Status On-going   PT LONG TERM GOAL #2   Title Patient (> 37 years old) will complete five times sit to stand test in < 15 seconds without HHA indicating an increased LE strength and improved balance. by 01/05/16   Time 8   Period Weeks   Status Achieved   PT LONG TERM GOAL #3   Title Patient will increase 10 meter walk test to >1.64ms as to improve gait speed for better community ambulation and to reduce fall risk. by 01/05/16   Time 8   Period Weeks   Status Achieved   PT LONG TERM GOAL #4   Title Patient will ascend/descend 4 stairs with 1 rail assist independently forward reciprocally, without loss of balance to improve ability to negotiate steps inside house. by 01/05/16   Time 8   Period Weeks   Status Partially Met   PT LONG TERM GOAL #5   Title Patient will tolerate 5 seconds of single leg stance without loss of balance to improve ability to get in and out of shower safely. by 01/05/16   Time 8   Period Weeks   Status New   PT LONG TERM GOAL #6   Title Patient will increase RLE knee AROM to 0-120 degrees to improve functional ROM for getting in/out of car, negotiating steps  etc. by 01/05/16   Time 8   Period Weeks   Status Partially Met   PT LONG TERM GOAL #7   Title Patient will increase BLE gross strength to 4+/5 as to improve functional strength for independent gait, increased standing tolerance and increased ADL ability. by 01/05/16   Time 8   Period Weeks   Status Partially Met  PT LONG TERM GOAL #8   Title Patient will report a worst pain of 3/10 on VAS in      right knee       to improve tolerance with ADLs and reduced symptoms with activities.    Baseline worst pain 5-6/10   Time 8   Period Weeks   Status Partially Met               Plan - 12/29/15 1041    Clinical Impression Statement Instructed patient in BLE strengthening  focusing on increasing RLE terminal knee extension. Patient continues to require cues for correct positioning to improve strength. She reports no increase in pain but does have fatigue with advanced exercise. Patient would benefit from additional skilled PT intervention to improve knee ROM, increase strength and reduce knee discomfort.    Pt will benefit from skilled therapeutic intervention in order to improve on the following deficits Abnormal gait;Decreased endurance;Hypomobility;Cardiopulmonary status limiting activity;Decreased activity tolerance;Decreased strength;Pain;Increased muscle spasms;Difficulty walking;Decreased mobility;Decreased balance;Decreased range of motion;Impaired flexibility;Decreased safety awareness   Rehab Potential Fair   Clinical Impairments Affecting Rehab Potential positive: motivation, negative: chronic knee pain, chronic smoker which delays healing; Patient's cliinical presentation is evolving as patient is anxious, has limited ROM, doesn't tolerate much manual therapy is a current every day smoker which affects healing potential;    PT Frequency 2x / week   PT Duration 8 weeks   PT Treatment/Interventions ADLs/Self Care Home Management;Aquatic Therapy;Cryotherapy;Electrical Stimulation;Moist  Heat;Balance training;Therapeutic exercise;Therapeutic activities;Functional mobility training;Gait training;Stair training;DME Instruction;Neuromuscular re-education;Patient/family education;Manual techniques;Taping;Energy conservation;Dry needling;Passive range of motion   PT Next Visit Plan work on quad strengthening   [place] continue as given;   Consulted and Agree with Plan of Care Patient        Problem List Patient Active Problem List   Diagnosis Date Noted  . Patella fracture 08/10/2015  . SAH (subarachnoid hemorrhage) ([place]) 05/12/2015    Trotter,Margaret PT, DPT 12/29/2015, 1:07 PM  [place] MAIN [facility] SERVICES [address] [place], [place], [address] Phone: [phone]   Fax:  [phone]  Name: Sydney Coleman MRN: [id number] Date of Birth: 1951/04/18

## 2016-01-02 ENCOUNTER — Ambulatory Visit: Payer: Medicare Other

## 2016-01-02 ENCOUNTER — Ambulatory Visit
Admission: RE | Admit: 2016-01-02 | Discharge: 2016-01-02 | Disposition: A | Discharge status: Home / Self Care | Payer: Medicare Other | Source: Ambulatory Visit | Attending: Interventional Radiology | Admitting: Interventional Radiology

## 2016-01-02 DIAGNOSIS — E041 Nontoxic single thyroid nodule: Secondary | ICD-10-CM | POA: Insufficient documentation

## 2016-01-02 DIAGNOSIS — Z09 Encounter for follow-up examination after completed treatment for conditions other than malignant neoplasm: Secondary | ICD-10-CM | POA: Diagnosis not present

## 2016-01-02 DIAGNOSIS — I729 Aneurysm of unspecified site: Secondary | ICD-10-CM

## 2016-01-02 DIAGNOSIS — I609 Nontraumatic subarachnoid hemorrhage, unspecified: Secondary | ICD-10-CM | POA: Diagnosis not present

## 2016-01-02 DIAGNOSIS — J449 Chronic obstructive pulmonary disease, unspecified: Secondary | ICD-10-CM | POA: Diagnosis not present

## 2016-01-02 LAB — POCT I-STAT CREATININE: CREATININE: 0.7 mg/dL (ref 0.44–1.00)

## 2016-01-02 MED ORDER — IOHEXOL 350 MG/ML SOLN
75.0000 mL | Freq: Once | INTRAVENOUS | Status: AC | PRN
  Administered 2016-01-02: 75 mL via INTRAVENOUS

## 2016-01-03 ENCOUNTER — Ambulatory Visit: Payer: Medicare Other

## 2016-01-03 DIAGNOSIS — R262 Difficulty in walking, not elsewhere classified: Secondary | ICD-10-CM

## 2016-01-03 DIAGNOSIS — M25561 Pain in right knee: Secondary | ICD-10-CM

## 2016-01-03 DIAGNOSIS — M25661 Stiffness of right knee, not elsewhere classified: Secondary | ICD-10-CM | POA: Diagnosis not present

## 2016-01-03 NOTE — Therapy (Signed)
[place] MAIN [facility] SERVICES [address] [place], [place], [address] Phone: [phone]   Fax:  [phone]  Physical Therapy Treatment  Patient Details  Name: Sydney Coleman MRN: [id number] Date of Birth: 02/07/1951 Referring Provider: Mack Guise  Encounter Date: 01/03/2016      PT End of Session - 01/03/16 1412    Visit Number 10   Number of Visits 17   Date for PT Re-Evaluation 01/05/16   Authorization Type Gcode 4   Authorization Time Period 10   PT Start Time 3143   PT Stop Time 8887   PT Time Calculation (min) 43 min   Activity Tolerance Patient tolerated treatment well;No increased pain   Behavior During Therapy [facility] for tasks assessed/performed      Past Medical History  Diagnosis Date  . TIA (transient ischemic attack)   . Chronic headaches   . SAH (subarachnoid hemorrhage) ([place]) 05/11/2015  . Acute spontaneous subarachnoid intracranial hemorrhage (HCC)   . Stroke [facility])     TIA  . Arthritis   . PONV (postoperative nausea and vomiting)   . Difficult intubation     two (2) teeth broken during intubation  . Elevated lipids   . Anxiety     better since pin removed;   Marland Kitchen Hypertension     controlled with medication;   . Cancer of vulva ([place])     >15 year ago;     Past Surgical History  Procedure Laterality Date  . Thyroidectomy, partial    . Placement of breast implants    . Abdominal hysterectomy    . Tonsillectomy    . Breast surgery    . Eye surgery Right     Cataract Extraction  . Joint replacement Left     Partial Hip Replacement  . Orif patella Right 08/10/2015    Procedure: OPEN REDUCTION INTERNAL (ORIF) FIXATION PATELLA TENSION BAND WIRING;  Surgeon: Thornton Park, MD;  Location: [facility] ORS;  Service: Orthopedics;  Laterality: Right;  . Hardware removal Right 10/27/2015    Procedure: HARDWARE REMOVAL;  Surgeon: Thornton Park, MD;  Location: [facility] ORS;  Service: Orthopedics;  Laterality: Right;    There  were no vitals filed for this visit.  Visit Diagnosis:  Right knee pain  Knee stiffness, right  Difficulty walking      Subjective Assessment - 01/03/16 1409    Subjective pt reports she still feels like the wires are poking through the knee.    Pertinent History Personal factors affecting rehab: chronic knee, current everyday smoker, anxiety somewhat controlled; increased risk for falls;    Limitations Standing;Walking   How long can you sit comfortably? N/A   How long can you stand comfortably? 10 min   How long can you walk comfortably? >300 feet;    Diagnostic tests patient is s/p hardware removal on 10/27/15 with good results;    Patient Stated Goals "I want to be able to walk independently and not feel fearful of falling" Reduce knee pain; Be able to get in/out of car;    Currently in Pain? Yes   Pain Score 3    Pain Orientation Right   Pain Descriptors / Indicators --  R knee   Pain Onset More than a month ago      Manual therapy AP/PA tibia mobs on femur grade III 20s x 3 each Proximal tib/fib mob PA/PA grade III 15s x 2 each MWM into knee extension - pt performing SAQ anterior glide with  over pressure in to knee extension x 10 Passive R knee extension with grade III over pressure 3 bouts of 20s  Pt demonstrates full R knee extension after manual therapy.  TherexL Leg press RLE only 60lbs 3x10 cues for terminal knee extension  Fwd step up 6inches (with knee raise) RLE only cues for terminal knee extension Standing resisted hip flexion, abduction , and extension with   red     Band x 15 each way BLE 3 step SLS hold 3 min x 3 bouts - mod cues for weight shift and min A for safety and promote weight shift  Pt requires mod verbal and tactile cues for proper exercise performance    pt able to achieve full knee extension in stance with cues                         PT Education - 01/03/16 1411    Education provided Yes   Education Details full knee  extension during stance phase   Person(s) Educated Patient   Methods Explanation   Comprehension Verbalized understanding             PT Long Term Goals - 12/20/15 1042    PT LONG TERM GOAL #1   Title Patient will be independent in home exercise program to improve strength/mobility for better functional independence with ADLs. by 01/05/16   Time 8   Period Weeks   Status On-going   PT LONG TERM GOAL #2   Title Patient (> 14 years old) will complete five times sit to stand test in < 15 seconds without HHA indicating an increased LE strength and improved balance. by 01/05/16   Time 8   Period Weeks   Status Achieved   PT LONG TERM GOAL #3   Title Patient will increase 10 meter walk test to >1.55ms as to improve gait speed for better community ambulation and to reduce fall risk. by 01/05/16   Time 8   Period Weeks   Status Achieved   PT LONG TERM GOAL #4   Title Patient will ascend/descend 4 stairs with 1 rail assist independently forward reciprocally, without loss of balance to improve ability to negotiate steps inside house. by 01/05/16   Time 8   Period Weeks   Status Partially Met   PT LONG TERM GOAL #5   Title Patient will tolerate 5 seconds of single leg stance without loss of balance to improve ability to get in and out of shower safely. by 01/05/16   Time 8   Period Weeks   Status New   PT LONG TERM GOAL #6   Title Patient will increase RLE knee AROM to 0-120 degrees to improve functional ROM for getting in/out of car, negotiating steps etc. by 01/05/16   Time 8   Period Weeks   Status Partially Met   PT LONG TERM GOAL #7   Title Patient will increase BLE gross strength to 4+/5 as to improve functional strength for independent gait, increased standing tolerance and increased ADL ability. by 01/05/16   Time 8   Period Weeks   Status Partially Met   PT LONG TERM GOAL #8   Title Patient will report a worst pain of 3/10 on VAS in      right knee       to improve tolerance  with ADLs and reduced symptoms with activities.    Baseline worst pain 5-6/10   Time 8   Period  Weeks   Status Partially Met               Plan - 01/03/16 1412    Clinical Impression Statement pt demonstrates full passive knee extension. pt is able to achieve full knee extension with step ups and leg press with cuing, however tends to revert back to slight knee flexion in stance phase when her attention is averted.    Pt will benefit from skilled therapeutic intervention in order to improve on the following deficits Abnormal gait;Decreased endurance;Hypomobility;Cardiopulmonary status limiting activity;Decreased activity tolerance;Decreased strength;Pain;Increased muscle spasms;Difficulty walking;Decreased mobility;Decreased balance;Decreased range of motion;Impaired flexibility;Decreased safety awareness   Rehab Potential Fair   Clinical Impairments Affecting Rehab Potential positive: motivation, negative: chronic knee pain, chronic smoker which delays healing; Patient's cliinical presentation is evolving as patient is anxious, has limited ROM, doesn't tolerate much manual therapy is a current every day smoker which affects healing potential;    PT Frequency 2x / week   PT Duration 8 weeks   PT Treatment/Interventions ADLs/Self Care Home Management;Aquatic Therapy;Cryotherapy;Electrical Stimulation;Moist Heat;Balance training;Therapeutic exercise;Therapeutic activities;Functional mobility training;Gait training;Stair training;DME Instruction;Neuromuscular re-education;Patient/family education;Manual techniques;Taping;Energy conservation;Dry needling;Passive range of motion   PT Next Visit Plan work on quad strengthening   [place] continue as given;   Consulted and Agree with Plan of Care Patient        Problem List Patient Active Problem List   Diagnosis Date Noted  . Patella fracture 08/10/2015  . SAH (subarachnoid hemorrhage) ([place]) 05/12/2015   Gorden Harms.  Delaila Nand, PT, DPT [phone]  June Vacha 01/03/2016, 2:31 PM  [place] MAIN [facility] SERVICES [address] [place], [place], [address] Phone: [phone]   Fax:  [phone]  Name: KYLYN MCDADE MRN: [id number] Date of Birth: August 14, 1951

## 2016-01-05 ENCOUNTER — Ambulatory Visit: Payer: Medicare Other

## 2016-01-08 DIAGNOSIS — M6281 Muscle weakness (generalized): Secondary | ICD-10-CM | POA: Diagnosis not present

## 2016-01-08 DIAGNOSIS — Z8673 Personal history of transient ischemic attack (TIA), and cerebral infarction without residual deficits: Secondary | ICD-10-CM | POA: Diagnosis not present

## 2016-01-10 ENCOUNTER — Telehealth (HOSPITAL_COMMUNITY): Payer: Self-pay

## 2016-01-10 NOTE — Telephone Encounter (Signed)
Called to have pt f/u in 6 months with a catheter angio with Dr. Estanislado Pandy. Left VM for pt to call back. AW

## 2016-01-11 ENCOUNTER — Telehealth (HOSPITAL_COMMUNITY): Payer: Self-pay | Admitting: Interventional Radiology

## 2016-01-11 NOTE — Telephone Encounter (Signed)
Pt returned our call concerning her recent CTA for follow-up of her hx of SAH. She agreed to follow-up in 6 month's time with a cerebral angiogram with Dr. Estanislado Pandy. Pt also stated that she knew about a finding on that CTA of a thyroid nodule and wanted to know if we would be taking care of following that. She stated that she had also gotten a message from her PCP concerning this recent scan. I told her that she needed to contact her PCP for follow-up of this finding. The pt agreed with a 6 month f/u cerebral angiogram and calling her PCP this morning to set up further evaluation of the thyroid finding. JM

## 2016-01-12 ENCOUNTER — Other Ambulatory Visit: Payer: Self-pay | Admitting: Family Medicine

## 2016-01-12 DIAGNOSIS — E0590 Thyrotoxicosis, unspecified without thyrotoxic crisis or storm: Secondary | ICD-10-CM

## 2016-01-12 DIAGNOSIS — E0789 Other specified disorders of thyroid: Secondary | ICD-10-CM

## 2016-01-12 DIAGNOSIS — E079 Disorder of thyroid, unspecified: Secondary | ICD-10-CM

## 2016-01-16 ENCOUNTER — Ambulatory Visit: Admission: RE | Admit: 2016-01-16 | Payer: Medicare Other | Source: Ambulatory Visit

## 2016-01-23 DIAGNOSIS — E782 Mixed hyperlipidemia: Secondary | ICD-10-CM | POA: Diagnosis not present

## 2016-01-23 DIAGNOSIS — R7301 Impaired fasting glucose: Secondary | ICD-10-CM | POA: Diagnosis not present

## 2016-01-23 DIAGNOSIS — D509 Iron deficiency anemia, unspecified: Secondary | ICD-10-CM | POA: Diagnosis not present

## 2016-01-23 DIAGNOSIS — E7800 Pure hypercholesterolemia, unspecified: Secondary | ICD-10-CM | POA: Diagnosis not present

## 2016-01-23 DIAGNOSIS — Z13220 Encounter for screening for lipoid disorders: Secondary | ICD-10-CM | POA: Diagnosis not present

## 2016-01-23 DIAGNOSIS — I10 Essential (primary) hypertension: Secondary | ICD-10-CM | POA: Diagnosis not present

## 2016-01-31 DIAGNOSIS — E782 Mixed hyperlipidemia: Secondary | ICD-10-CM | POA: Diagnosis not present

## 2016-01-31 DIAGNOSIS — R7301 Impaired fasting glucose: Secondary | ICD-10-CM | POA: Diagnosis not present

## 2016-01-31 DIAGNOSIS — I10 Essential (primary) hypertension: Secondary | ICD-10-CM | POA: Diagnosis not present

## 2016-01-31 DIAGNOSIS — Z1389 Encounter for screening for other disorder: Secondary | ICD-10-CM | POA: Diagnosis not present

## 2016-01-31 DIAGNOSIS — R748 Abnormal levels of other serum enzymes: Secondary | ICD-10-CM | POA: Diagnosis not present

## 2016-02-08 DIAGNOSIS — M6281 Muscle weakness (generalized): Secondary | ICD-10-CM | POA: Diagnosis not present

## 2016-02-08 DIAGNOSIS — Z8673 Personal history of transient ischemic attack (TIA), and cerebral infarction without residual deficits: Secondary | ICD-10-CM | POA: Diagnosis not present

## 2016-02-15 ENCOUNTER — Ambulatory Visit
Admission: RE | Admit: 2016-02-15 | Discharge: 2016-02-15 | Disposition: A | Discharge status: Home / Self Care | Payer: Medicare Other | Source: Ambulatory Visit | Attending: Family Medicine | Admitting: Family Medicine

## 2016-02-15 DIAGNOSIS — E042 Nontoxic multinodular goiter: Secondary | ICD-10-CM | POA: Diagnosis not present

## 2016-02-15 DIAGNOSIS — E079 Disorder of thyroid, unspecified: Secondary | ICD-10-CM

## 2016-02-15 DIAGNOSIS — E0590 Thyrotoxicosis, unspecified without thyrotoxic crisis or storm: Secondary | ICD-10-CM

## 2016-02-15 DIAGNOSIS — E0789 Other specified disorders of thyroid: Secondary | ICD-10-CM

## 2016-02-20 ENCOUNTER — Other Ambulatory Visit (HOSPITAL_COMMUNITY): Payer: Self-pay | Admitting: Family Medicine

## 2016-02-20 DIAGNOSIS — E041 Nontoxic single thyroid nodule: Secondary | ICD-10-CM

## 2016-02-22 ENCOUNTER — Other Ambulatory Visit: Payer: Self-pay | Admitting: Family Medicine

## 2016-02-22 DIAGNOSIS — E041 Nontoxic single thyroid nodule: Secondary | ICD-10-CM

## 2016-02-24 ENCOUNTER — Ambulatory Visit (HOSPITAL_COMMUNITY): Payer: Medicare Other

## 2016-03-02 ENCOUNTER — Ambulatory Visit: Admission: RE | Admit: 2016-03-02 | Payer: Medicare Other | Source: Ambulatory Visit

## 2016-03-06 DIAGNOSIS — M25561 Pain in right knee: Secondary | ICD-10-CM | POA: Diagnosis not present

## 2016-03-07 ENCOUNTER — Ambulatory Visit
Admission: RE | Admit: 2016-03-07 | Discharge: 2016-03-07 | Disposition: A | Discharge status: Home / Self Care | Payer: Medicare Other | Source: Ambulatory Visit | Attending: Family Medicine | Admitting: Family Medicine

## 2016-03-07 DIAGNOSIS — E041 Nontoxic single thyroid nodule: Secondary | ICD-10-CM | POA: Diagnosis not present

## 2016-03-07 DIAGNOSIS — E042 Nontoxic multinodular goiter: Secondary | ICD-10-CM | POA: Insufficient documentation

## 2016-03-07 NOTE — Procedures (Signed)
Interventional Radiology Procedure Note  Procedure:  US guided bx of left thyroid nodule, for AFIRMA. 3 samples for cytology, with 2 for AFIRMA Complications: No immediate Recommendations:  - Ok to shower tomorrow - Do not submerge for 7 days - Routine care   Signed,  Dulcy Fanny. Earleen Newport, DO

## 2016-03-09 DIAGNOSIS — Z8673 Personal history of transient ischemic attack (TIA), and cerebral infarction without residual deficits: Secondary | ICD-10-CM | POA: Diagnosis not present

## 2016-03-09 DIAGNOSIS — M6281 Muscle weakness (generalized): Secondary | ICD-10-CM | POA: Diagnosis not present

## 2016-03-21 ENCOUNTER — Other Ambulatory Visit: Payer: Self-pay | Admitting: Internal Medicine

## 2016-03-21 DIAGNOSIS — E049 Nontoxic goiter, unspecified: Secondary | ICD-10-CM

## 2016-03-21 DIAGNOSIS — E032 Hypothyroidism due to medicaments and other exogenous substances: Secondary | ICD-10-CM | POA: Diagnosis not present

## 2016-03-21 DIAGNOSIS — R5383 Other fatigue: Secondary | ICD-10-CM | POA: Diagnosis not present

## 2016-03-22 ENCOUNTER — Other Ambulatory Visit
Admission: RE | Admit: 2016-03-22 | Discharge: 2016-03-22 | Disposition: A | Discharge status: Home / Self Care | Payer: Medicare Other | Source: Ambulatory Visit | Attending: Internal Medicine | Admitting: Internal Medicine

## 2016-03-22 DIAGNOSIS — E049 Nontoxic goiter, unspecified: Secondary | ICD-10-CM | POA: Insufficient documentation

## 2016-03-22 LAB — T4, FREE: FREE T4: 0.78 ng/dL (ref 0.61–1.12)

## 2016-03-22 LAB — TSH: TSH: 3.111 u[IU]/mL (ref 0.350–4.500)

## 2016-03-23 LAB — T3, FREE: T3 FREE: 4.3 pg/mL (ref 2.0–4.4)

## 2016-03-24 DIAGNOSIS — E0789 Other specified disorders of thyroid: Secondary | ICD-10-CM | POA: Diagnosis not present

## 2016-03-24 DIAGNOSIS — E041 Nontoxic single thyroid nodule: Secondary | ICD-10-CM | POA: Diagnosis not present

## 2016-03-26 ENCOUNTER — Telehealth: Payer: Self-pay | Admitting: Neurology

## 2016-03-26 DIAGNOSIS — H16223 Keratoconjunctivitis sicca, not specified as Sjogren's, bilateral: Secondary | ICD-10-CM | POA: Diagnosis not present

## 2016-03-26 DIAGNOSIS — H02839 Dermatochalasis of unspecified eye, unspecified eyelid: Secondary | ICD-10-CM | POA: Diagnosis not present

## 2016-03-26 DIAGNOSIS — I10 Essential (primary) hypertension: Secondary | ICD-10-CM | POA: Diagnosis not present

## 2016-03-26 DIAGNOSIS — H26492 Other secondary cataract, left eye: Secondary | ICD-10-CM | POA: Diagnosis not present

## 2016-03-26 NOTE — Telephone Encounter (Signed)
Pt called in to make appt. She has been seeing Dr. Krista Blue but does not want to see her anymore. She was seen by Dr. Leonie Man on 05/12/15 and would like to again. She needs and appt but needs approval first for transfer of care. Please notify pt of appt with Dr. Leonie Man.  Husbands Cell: [phone]

## 2016-03-26 NOTE — Telephone Encounter (Signed)
Ok with me if Dr Krista Blue approves

## 2016-03-27 NOTE — Telephone Encounter (Signed)
It is Ok for her to Dr. Leonie Man

## 2016-04-03 ENCOUNTER — Encounter
Admission: RE | Admit: 2016-04-03 | Discharge: 2016-04-03 | Disposition: A | Discharge status: Home / Self Care | Payer: Medicare Other | Source: Ambulatory Visit | Attending: Internal Medicine | Admitting: Internal Medicine

## 2016-04-03 DIAGNOSIS — E049 Nontoxic goiter, unspecified: Secondary | ICD-10-CM | POA: Insufficient documentation

## 2016-04-03 MED ORDER — SODIUM IODIDE I-123 7.4 MBQ PO CAPS
161.4000 | ORAL_CAPSULE | Freq: Once | ORAL | Status: AC
  Administered 2016-04-03: 161.4 via ORAL

## 2016-04-04 ENCOUNTER — Encounter
Admission: RE | Admit: 2016-04-04 | Discharge: 2016-04-04 | Disposition: A | Discharge status: Home / Self Care | Payer: Medicare Other | Source: Ambulatory Visit | Attending: Internal Medicine | Admitting: Internal Medicine

## 2016-04-04 DIAGNOSIS — E049 Nontoxic goiter, unspecified: Secondary | ICD-10-CM | POA: Diagnosis not present

## 2016-04-04 DIAGNOSIS — E042 Nontoxic multinodular goiter: Secondary | ICD-10-CM | POA: Diagnosis not present

## 2016-04-04 LAB — CYTOLOGY - NON PAP

## 2016-04-05 ENCOUNTER — Encounter: Payer: Self-pay | Admitting: Interventional Radiology

## 2016-04-09 DIAGNOSIS — Z8673 Personal history of transient ischemic attack (TIA), and cerebral infarction without residual deficits: Secondary | ICD-10-CM | POA: Diagnosis not present

## 2016-04-09 DIAGNOSIS — M6281 Muscle weakness (generalized): Secondary | ICD-10-CM | POA: Diagnosis not present

## 2016-04-23 ENCOUNTER — Ambulatory Visit: Payer: Medicare Other | Admitting: "Endocrinology

## 2016-04-25 DIAGNOSIS — I10 Essential (primary) hypertension: Secondary | ICD-10-CM | POA: Diagnosis not present

## 2016-04-25 DIAGNOSIS — H01009 Unspecified blepharitis unspecified eye, unspecified eyelid: Secondary | ICD-10-CM | POA: Diagnosis not present

## 2016-04-25 DIAGNOSIS — H16223 Keratoconjunctivitis sicca, not specified as Sjogren's, bilateral: Secondary | ICD-10-CM | POA: Diagnosis not present

## 2016-04-26 DIAGNOSIS — E032 Hypothyroidism due to medicaments and other exogenous substances: Secondary | ICD-10-CM | POA: Diagnosis not present

## 2016-04-28 DIAGNOSIS — I10 Essential (primary) hypertension: Secondary | ICD-10-CM | POA: Diagnosis not present

## 2016-04-28 DIAGNOSIS — H16223 Keratoconjunctivitis sicca, not specified as Sjogren's, bilateral: Secondary | ICD-10-CM | POA: Diagnosis not present

## 2016-04-28 DIAGNOSIS — H01009 Unspecified blepharitis unspecified eye, unspecified eyelid: Secondary | ICD-10-CM | POA: Diagnosis not present

## 2016-05-07 ENCOUNTER — Inpatient Hospital Stay: Admission: RE | Admit: 2016-05-07 | Payer: Medicare Other | Source: Ambulatory Visit

## 2016-05-07 ENCOUNTER — Other Ambulatory Visit: Payer: Self-pay | Admitting: Orthopedic Surgery

## 2016-05-08 ENCOUNTER — Encounter: Payer: Self-pay | Admitting: *Deleted

## 2016-05-08 ENCOUNTER — Other Ambulatory Visit
Admission: RE | Admit: 2016-05-08 | Discharge: 2016-05-08 | Disposition: A | Discharge status: Home / Self Care | Payer: Medicare Other | Source: Ambulatory Visit | Attending: Orthopedic Surgery | Admitting: Orthopedic Surgery

## 2016-05-08 ENCOUNTER — Other Ambulatory Visit: Payer: Self-pay | Admitting: Orthopedic Surgery

## 2016-05-08 ENCOUNTER — Inpatient Hospital Stay
Admission: RE | Admit: 2016-05-08 | Discharge: 2016-05-08 | Disposition: A | Discharge status: Home / Self Care | Payer: Medicare Other | Source: Ambulatory Visit

## 2016-05-08 DIAGNOSIS — Z01812 Encounter for preprocedural laboratory examination: Secondary | ICD-10-CM | POA: Insufficient documentation

## 2016-05-08 DIAGNOSIS — M25561 Pain in right knee: Secondary | ICD-10-CM | POA: Diagnosis not present

## 2016-05-08 DIAGNOSIS — M25569 Pain in unspecified knee: Secondary | ICD-10-CM | POA: Insufficient documentation

## 2016-05-08 LAB — BASIC METABOLIC PANEL
ANION GAP: 9 (ref 5–15)
BUN: 21 mg/dL — ABNORMAL HIGH (ref 6–20)
CHLORIDE: 105 mmol/L (ref 101–111)
CO2: 27 mmol/L (ref 22–32)
Calcium: 9 mg/dL (ref 8.9–10.3)
Creatinine, Ser: 0.75 mg/dL (ref 0.44–1.00)
GFR calc non Af Amer: >60 mL/min (ref >60)
Glucose, Bld: 106 mg/dL — ABNORMAL HIGH (ref 65–99)
Potassium: 4.2 mmol/L (ref 3.5–5.1)
Sodium: 141 mmol/L (ref 135–145)

## 2016-05-08 LAB — CBC WITH DIFFERENTIAL/PLATELET
BASOS ABS: 0.1 10*3/uL (ref 0–0.1)
BASOS PCT: 1 %
EOS ABS: 0.1 10*3/uL (ref 0–0.7)
EOS PCT: 1 %
HCT: 46.4 % (ref 35.0–47.0)
Hemoglobin: 15.9 g/dL (ref 12.0–16.0)
LYMPHS ABS: 3.5 10*3/uL (ref 1.0–3.6)
Lymphocytes Relative: 35 %
MCH: 33.9 pg (ref 26.0–34.0)
MCHC: 34.3 g/dL (ref 32.0–36.0)
MCV: 98.9 fL (ref 80.0–100.0)
MONO ABS: 0.8 10*3/uL (ref 0.2–0.9)
MONOS PCT: 8 %
NEUTROS ABS: 5.4 10*3/uL (ref 1.4–6.5)
NEUTROS PCT: 55 %
PLATELETS: 227 10*3/uL (ref 150–440)
RBC: 4.69 MIL/uL (ref 3.80–5.20)
RDW: 13.3 % (ref 11.5–14.5)
WBC: 9.9 10*3/uL (ref 3.6–11.0)

## 2016-05-08 LAB — PROTIME-INR
INR: 0.89
PROTHROMBIN TIME: 12.3 s (ref 11.4–15.0)

## 2016-05-08 LAB — APTT: aPTT: 26 seconds (ref 24–36)

## 2016-05-08 NOTE — Patient Instructions (Signed)
  Your procedure is scheduled on:05/16/16 Report to Day Surgery. [facility] SECOND FLOOR To find out your arrival time please call [phone] between 1PM - 3PM on 05/15/16  Remember: Instructions that are not followed completely may result in serious medical risk, up to and including death, or upon the discretion of your surgeon and anesthesiologist your surgery may need to be rescheduled.    __X__ 1. Do not eat food or drink liquids after midnight. No gum chewing or hard candies.     __X__ 2. No Alcohol for 24 hours before or after surgery.   _X___ 3. Do Not Smoke For 24 Hours Prior to Your Surgery.   ____ 4. Bring all medications with you on the day of surgery if instructed.    __X__ 5. Notify your doctor if there is any change in your medical condition     (cold, fever, infections).       Do not wear jewelry, make-up, hairpins, clips or nail polish.  Do not wear lotions, powders, or perfumes. You may wear deodorant.  Do not shave 48 hours prior to surgery. Men may shave face and neck.  Do not bring valuables to the hospital.    [facility] is not responsible for any belongings or valuables.               Contacts, dentures or bridgework may not be worn into surgery.  Leave your suitcase in the car. After surgery it may be brought to your room.  For patients admitted to the hospital, discharge time is determined by your                treatment team.   Patients discharged the day of surgery will not be allowed to drive home.   Please read over the following fact sheets that you were given:   Surgical Site Infection Prevention   __X__ Take these medicines the morning of surgery with A SIP OF WATER:    1. LEVOTHYROXINE  2. COZAAR  3.   4.  5.  6.  ____ Fleet Enema (as directed)   __X__ Use CHG Soap as directed  ____ Use inhalers on the day of surgery  ____ Stop metformin 2 days prior to surgery    ____ Take 1/2 of usual insulin dose the night before surgery and  none on the morning of surgery.   __X__ Stop Coumadin/Plavix/aspirin on      STOP ASPIRIN NOW ____ Stop Anti-inflammatories on    ____ Stop supplements until after surgery.    ____ Bring C-Pap to the hospital.

## 2016-05-08 NOTE — Pre-Procedure Instructions (Signed)
Pt here for lab draw.

## 2016-05-09 DIAGNOSIS — Z8673 Personal history of transient ischemic attack (TIA), and cerebral infarction without residual deficits: Secondary | ICD-10-CM | POA: Diagnosis not present

## 2016-05-09 DIAGNOSIS — M6281 Muscle weakness (generalized): Secondary | ICD-10-CM | POA: Diagnosis not present

## 2016-05-09 NOTE — Pre-Procedure Instructions (Signed)
REQUEST FOR DR ADAMS,PISCITELLO OR CARROLL CALLED TO OR

## 2016-05-10 ENCOUNTER — Ambulatory Visit
Admission: RE | Admit: 2016-05-10 | Discharge: 2016-05-10 | Disposition: A | Discharge status: Home / Self Care | Payer: Medicare Other | Source: Ambulatory Visit | Attending: Orthopedic Surgery | Admitting: Orthopedic Surgery

## 2016-05-10 DIAGNOSIS — S82031A Displaced transverse fracture of right patella, initial encounter for closed fracture: Secondary | ICD-10-CM | POA: Diagnosis not present

## 2016-05-10 DIAGNOSIS — M25561 Pain in right knee: Secondary | ICD-10-CM | POA: Diagnosis not present

## 2016-05-10 DIAGNOSIS — M1711 Unilateral primary osteoarthritis, right knee: Secondary | ICD-10-CM | POA: Diagnosis not present

## 2016-05-14 NOTE — Pre-Procedure Instructions (Signed)
CLEARED BY DR Consuello Masse 05/10/16

## 2016-05-15 MED ORDER — LIDOCAINE HCL 2 % IJ SOLN: 0.1000 mL | Freq: Once | INTRAMUSCULAR | Status: DC

## 2016-05-16 ENCOUNTER — Ambulatory Visit: Payer: Medicare Other | Admitting: Anesthesiology

## 2016-05-16 ENCOUNTER — Ambulatory Visit: Payer: Medicare Other

## 2016-05-16 ENCOUNTER — Encounter: Payer: Self-pay | Admitting: *Deleted

## 2016-05-16 ENCOUNTER — Encounter
Admission: RE | Disposition: A | Discharge status: Home / Self Care | Payer: Self-pay | Source: Ambulatory Visit | Attending: Orthopedic Surgery

## 2016-05-16 ENCOUNTER — Ambulatory Visit
Admission: RE | Admit: 2016-05-16 | Discharge: 2016-05-16 | Disposition: A | Discharge status: Home / Self Care | Payer: Medicare Other | Source: Ambulatory Visit | Attending: Orthopedic Surgery | Admitting: Orthopedic Surgery

## 2016-05-16 DIAGNOSIS — Z79899 Other long term (current) drug therapy: Secondary | ICD-10-CM | POA: Insufficient documentation

## 2016-05-16 DIAGNOSIS — S82001D Unspecified fracture of right patella, subsequent encounter for closed fracture with routine healing: Secondary | ICD-10-CM | POA: Diagnosis not present

## 2016-05-16 DIAGNOSIS — E049 Nontoxic goiter, unspecified: Secondary | ICD-10-CM | POA: Insufficient documentation

## 2016-05-16 DIAGNOSIS — I10 Essential (primary) hypertension: Secondary | ICD-10-CM | POA: Insufficient documentation

## 2016-05-16 DIAGNOSIS — Z90710 Acquired absence of both cervix and uterus: Secondary | ICD-10-CM | POA: Diagnosis not present

## 2016-05-16 DIAGNOSIS — X58XXXS Exposure to other specified factors, sequela: Secondary | ICD-10-CM | POA: Diagnosis not present

## 2016-05-16 DIAGNOSIS — Z881 Allergy status to other antibiotic agents status: Secondary | ICD-10-CM | POA: Diagnosis not present

## 2016-05-16 DIAGNOSIS — M25561 Pain in right knee: Secondary | ICD-10-CM | POA: Diagnosis not present

## 2016-05-16 DIAGNOSIS — M810 Age-related osteoporosis without current pathological fracture: Secondary | ICD-10-CM | POA: Insufficient documentation

## 2016-05-16 DIAGNOSIS — Z9889 Other specified postprocedural states: Secondary | ICD-10-CM

## 2016-05-16 DIAGNOSIS — E039 Hypothyroidism, unspecified: Secondary | ICD-10-CM | POA: Diagnosis not present

## 2016-05-16 DIAGNOSIS — Z880 Allergy status to penicillin: Secondary | ICD-10-CM | POA: Diagnosis not present

## 2016-05-16 DIAGNOSIS — T8484XA Pain due to internal orthopedic prosthetic devices, implants and grafts, initial encounter: Secondary | ICD-10-CM | POA: Diagnosis not present

## 2016-05-16 DIAGNOSIS — Z8589 Personal history of malignant neoplasm of other organs and systems: Secondary | ICD-10-CM | POA: Insufficient documentation

## 2016-05-16 DIAGNOSIS — Z472 Encounter for removal of internal fixation device: Secondary | ICD-10-CM | POA: Diagnosis present

## 2016-05-16 DIAGNOSIS — E7800 Pure hypercholesterolemia, unspecified: Secondary | ICD-10-CM | POA: Insufficient documentation

## 2016-05-16 DIAGNOSIS — F17200 Nicotine dependence, unspecified, uncomplicated: Secondary | ICD-10-CM | POA: Diagnosis not present

## 2016-05-16 DIAGNOSIS — Z7982 Long term (current) use of aspirin: Secondary | ICD-10-CM | POA: Diagnosis not present

## 2016-05-16 DIAGNOSIS — J449 Chronic obstructive pulmonary disease, unspecified: Secondary | ICD-10-CM | POA: Insufficient documentation

## 2016-05-16 DIAGNOSIS — Z419 Encounter for procedure for purposes other than remedying health state, unspecified: Secondary | ICD-10-CM

## 2016-05-16 HISTORY — DX: Hypothyroidism, unspecified: E03.9

## 2016-05-16 HISTORY — DX: Chronic obstructive pulmonary disease, unspecified: J44.9

## 2016-05-16 HISTORY — PX: HARDWARE REMOVAL: SHX979

## 2016-05-16 SURGERY — REMOVAL, HARDWARE
Anesthesia: General | Laterality: Right

## 2016-05-16 MED ORDER — FENTANYL CITRATE (PF) 100 MCG/2ML IJ SOLN
25.0000 ug | INTRAMUSCULAR | Status: AC | PRN
  Administered 2016-05-16 (×6): 25 ug via INTRAVENOUS

## 2016-05-16 MED ORDER — CHLORHEXIDINE GLUCONATE 4 % EX LIQD: 60.0000 mL | Freq: Once | CUTANEOUS | Status: DC

## 2016-05-16 MED ORDER — NEOMYCIN-POLYMYXIN B GU 40-200000 IR SOLN
Status: DC | PRN
  Administered 2016-05-16: 2 mL

## 2016-05-16 MED ORDER — HYDROMORPHONE HCL 1 MG/ML IJ SOLN: 0.2500 mg | INTRAMUSCULAR | Status: DC | PRN

## 2016-05-16 MED ORDER — CLINDAMYCIN PHOSPHATE 600 MG/50ML IV SOLN
INTRAVENOUS | Status: AC
  Filled 2016-05-16: qty 50

## 2016-05-16 MED ORDER — FENTANYL CITRATE (PF) 100 MCG/2ML IJ SOLN
INTRAMUSCULAR | Status: AC
  Administered 2016-05-16: 25 ug via INTRAVENOUS
  Filled 2016-05-16: qty 2

## 2016-05-16 MED ORDER — LACTATED RINGERS IV SOLN
INTRAVENOUS | Status: DC
  Administered 2016-05-16 (×2): via INTRAVENOUS

## 2016-05-16 MED ORDER — BUPIVACAINE HCL (PF) 0.25 % IJ SOLN
INTRAMUSCULAR | Status: AC
  Filled 2016-05-16: qty 30

## 2016-05-16 MED ORDER — HYDROCODONE-ACETAMINOPHEN 5-325 MG PO TABS
ORAL_TABLET | ORAL | Status: AC
  Filled 2016-05-16: qty 1

## 2016-05-16 MED ORDER — FENTANYL CITRATE (PF) 100 MCG/2ML IJ SOLN
INTRAMUSCULAR | Status: DC | PRN
  Administered 2016-05-16 (×2): 25 ug via INTRAVENOUS
  Administered 2016-05-16: 50 ug via INTRAVENOUS

## 2016-05-16 MED ORDER — DEXAMETHASONE SODIUM PHOSPHATE 10 MG/ML IJ SOLN
INTRAMUSCULAR | Status: DC | PRN
  Administered 2016-05-16: 5 mg via INTRAVENOUS

## 2016-05-16 MED ORDER — HYDROCODONE-ACETAMINOPHEN 5-325 MG PO TABS
1.0000 | ORAL_TABLET | ORAL | Status: DC | PRN
  Administered 2016-05-16: 1 via ORAL

## 2016-05-16 MED ORDER — LIDOCAINE HCL (CARDIAC) 20 MG/ML IV SOLN
INTRAVENOUS | Status: DC | PRN
  Administered 2016-05-16: 80 mg via INTRAVENOUS

## 2016-05-16 MED ORDER — ACETAMINOPHEN 10 MG/ML IV SOLN
INTRAVENOUS | Status: AC
  Filled 2016-05-16: qty 100

## 2016-05-16 MED ORDER — PROPOFOL 10 MG/ML IV BOLUS
INTRAVENOUS | Status: DC | PRN
  Administered 2016-05-16: 170 mg via INTRAVENOUS

## 2016-05-16 MED ORDER — HYDROMORPHONE HCL 1 MG/ML IJ SOLN
INTRAMUSCULAR | Status: AC
  Filled 2016-05-16: qty 1

## 2016-05-16 MED ORDER — HYDROCODONE-ACETAMINOPHEN 5-325 MG PO TABS: 1.0000 | ORAL_TABLET | ORAL | 0 refills | Status: AC | PRN

## 2016-05-16 MED ORDER — ONDANSETRON HCL 4 MG PO TABS: 4.0000 mg | ORAL_TABLET | Freq: Three times a day (TID) | ORAL | 0 refills | Status: AC | PRN

## 2016-05-16 MED ORDER — ONDANSETRON HCL 4 MG/2ML IJ SOLN
4.0000 mg | Freq: Once | INTRAMUSCULAR | Status: AC | PRN
  Administered 2016-05-16: 4 mg via INTRAVENOUS

## 2016-05-16 MED ORDER — MIDAZOLAM HCL 2 MG/2ML IJ SOLN
INTRAMUSCULAR | Status: DC | PRN
  Administered 2016-05-16: 2 mg via INTRAVENOUS

## 2016-05-16 MED ORDER — EPHEDRINE SULFATE 50 MG/ML IJ SOLN
INTRAMUSCULAR | Status: DC | PRN
  Administered 2016-05-16: 10 mg via INTRAVENOUS
  Administered 2016-05-16 (×3): 5 mg via INTRAVENOUS

## 2016-05-16 MED ORDER — ONDANSETRON HCL 4 MG/2ML IJ SOLN
INTRAMUSCULAR | Status: DC | PRN
  Administered 2016-05-16: 4 mg via INTRAVENOUS

## 2016-05-16 MED ORDER — ONDANSETRON HCL 4 MG/2ML IJ SOLN
INTRAMUSCULAR | Status: AC
  Administered 2016-05-16: 4 mg via INTRAVENOUS
  Filled 2016-05-16: qty 2

## 2016-05-16 MED ORDER — GLYCOPYRROLATE 0.2 MG/ML IJ SOLN
INTRAMUSCULAR | Status: DC | PRN
  Administered 2016-05-16: 0.2 mg via INTRAVENOUS

## 2016-05-16 MED ORDER — ACETAMINOPHEN 10 MG/ML IV SOLN
INTRAVENOUS | Status: DC | PRN
  Administered 2016-05-16: 1000 mg via INTRAVENOUS

## 2016-05-16 MED ORDER — CLINDAMYCIN PHOSPHATE 600 MG/50ML IV SOLN
600.0000 mg | Freq: Once | INTRAVENOUS | Status: AC
Start: 2016-05-16 — End: 2016-05-16
  Administered 2016-05-16: 600 mg via INTRAVENOUS

## 2016-05-16 MED ORDER — BUPIVACAINE HCL (PF) 0.5 % IJ SOLN
INTRAMUSCULAR | Status: AC
  Filled 2016-05-16: qty 30

## 2016-05-16 MED ORDER — BUPIVACAINE HCL (PF) 0.25 % IJ SOLN
INTRAMUSCULAR | Status: DC | PRN
  Administered 2016-05-16: 30 mL

## 2016-05-16 MED ORDER — NEOMYCIN-POLYMYXIN B GU 40-200000 IR SOLN
Status: AC
  Filled 2016-05-16: qty 4

## 2016-05-16 SURGICAL SUPPLY — 37 items
BLADE SURG 15 STRL LF DISP TIS (BLADE) ×1 IMPLANT
BLADE SURG 15 STRL SS (BLADE) ×2
BNDG ESMARK 6X12 TAN STRL LF (GAUZE/BANDAGES/DRESSINGS) ×3 IMPLANT
CANISTER SUCT 1200ML W/VALVE (MISCELLANEOUS) ×3 IMPLANT
CLOSURE WOUND 1/2 X4 (GAUZE/BANDAGES/DRESSINGS) ×1
CUFF TOURN 18 STER (MISCELLANEOUS) IMPLANT
CUFF TOURN 24 STER (MISCELLANEOUS) ×3 IMPLANT
DRAPE FLUOR MINI C-ARM 54X84 (DRAPES) ×3 IMPLANT
DRAPE INCISE IOBAN 66X45 STRL (DRAPES) ×3 IMPLANT
DRAPE U-SHAPE 47X51 STRL (DRAPES) ×3 IMPLANT
DURAPREP 26ML APPLICATOR (WOUND CARE) ×3 IMPLANT
ELECT REM PT RETURN 9FT ADLT (ELECTROSURGICAL) ×3
ELECTRODE REM PT RTRN 9FT ADLT (ELECTROSURGICAL) ×1 IMPLANT
GAUZE PETRO XEROFOAM 1X8 (MISCELLANEOUS) ×3 IMPLANT
GAUZE SPONGE 4X4 12PLY STRL (GAUZE/BANDAGES/DRESSINGS) ×3 IMPLANT
GLOVE BIOGEL PI IND STRL 9 (GLOVE) ×1 IMPLANT
GLOVE BIOGEL PI INDICATOR 9 (GLOVE) ×2
GLOVE SURG 9.0 ORTHO LTXF (GLOVE) ×3 IMPLANT
GOWN STRL REUS TWL 2XL XL LVL4 (GOWN DISPOSABLE) ×3 IMPLANT
GOWN STRL REUS W/ TWL LRG LVL3 (GOWN DISPOSABLE) ×1 IMPLANT
GOWN STRL REUS W/TWL LRG LVL3 (GOWN DISPOSABLE) ×2
KIT RM TURNOVER STRD PROC AR (KITS) ×3 IMPLANT
LABEL OR SOLS (LABEL) ×3 IMPLANT
NS IRRIG 1000ML POUR BTL (IV SOLUTION) ×3 IMPLANT
PACK EXTREMITY ARMC (MISCELLANEOUS) ×3 IMPLANT
PAD ABD DERMACEA PRESS 5X9 (GAUZE/BANDAGES/DRESSINGS) ×3 IMPLANT
PAD CAST CTTN 4X4 STRL (SOFTGOODS) ×1 IMPLANT
PADDING CAST COTTON 4X4 STRL (SOFTGOODS) ×2
SPLINT CAST 1 STEP 4X30 (MISCELLANEOUS) IMPLANT
SPONGE LAP 18X18 5 PK (GAUZE/BANDAGES/DRESSINGS) IMPLANT
STAPLER SKIN PROX 35W (STAPLE) ×3 IMPLANT
STOCKINETTE STRL 6IN 960660 (GAUZE/BANDAGES/DRESSINGS) ×3 IMPLANT
STRIP CLOSURE SKIN 1/2X4 (GAUZE/BANDAGES/DRESSINGS) ×2 IMPLANT
SUT VIC AB 2-0 SH 27 (SUTURE) ×2
SUT VIC AB 2-0 SH 27XBRD (SUTURE) ×1 IMPLANT
SYR 30ML LL (SYRINGE) ×3 IMPLANT
TAPE MICROPORE 2IN (TAPE) ×3 IMPLANT

## 2016-05-16 NOTE — Anesthesia Procedure Notes (Signed)
Procedure Name: LMA Insertion Date/Time: 05/16/2016 1:31 PM Performed by: Doreen Salvage Pre-anesthesia Checklist: Patient identified, Patient being monitored, Timeout performed, Emergency Drugs available and Suction available Patient Re-evaluated:Patient Re-evaluated prior to inductionOxygen Delivery Method: Circle system utilized Preoxygenation: Pre-oxygenation with 100% oxygen Intubation Type: IV induction Ventilation: Mask ventilation without difficulty LMA: LMA inserted LMA Size: 3.5 Tube type: Oral Number of attempts: 1 Placement Confirmation: positive ETCO2 and breath sounds checked- equal and bilateral Tube secured with: Tape Dental Injury: Teeth and Oropharynx as per pre-operative assessment

## 2016-05-16 NOTE — Transfer of Care (Signed)
Immediate Anesthesia Transfer of Care Note  Patient: [name]  Procedure(s) Performed: Procedure(s): HARDWARE REMOVAL (Right)  Patient Location: PACU  Anesthesia Type:General  Level of Consciousness: sedated  Airway & Oxygen Therapy: Patient Spontanous Breathing and Patient connected to face mask oxygen  Post-op Assessment: Report given to RN and Post -op Vital signs reviewed and stable  Post vital signs: Reviewed and stable  Last Vitals:  Vitals:   05/16/16 1212 05/16/16 1505  BP: (!) 152/82 123/68  Pulse: (!) 58 74  Resp: 18 12  Temp: (!) 35.8 C (!) [id number] C    Complications: No apparent anesthesia complications

## 2016-05-16 NOTE — Op Note (Signed)
05/16/2016  3:09 PM  PATIENT:  Sydney Coleman    PRE-OPERATIVE DIAGNOSIS:  M25.561 Pain in right knee  POST-OPERATIVE DIAGNOSIS:  Same  PROCEDURE:  HARDWARE REMOVAL  SURGEON:  Thornton Park, MD  ANESTHESIA:   General  PREOPERATIVE INDICATIONS:  Sydney Coleman is a  65 y.o. female with a diagnosis of painful hardware right knee after ORIF of right patellar fracture in October 2016. Given the patient's persistent pain and recommended hardware removal.  I discussed the risks and benefits of surgery. The risks include but are not limited to infection, bleeding requiring blood transfusion, nerve or blood vessel injury, joint stiffness or loss of motion, persistent pain, weakness or instability, malunion, nonunion and hardware failure and the need for further surgery. Medical risks include but are not limited to DVT and pulmonary embolism, myocardial infarction, stroke, pneumonia, respiratory failure and death. Patient understood these risks and wished to proceed.   OPERATIVE FINDINGS: Fracture pierced stable. There is no movement at the fracture site or diastases of the fracture with knee flexion.  OPERATIVE PROCEDURE: Patient was brought to the operating room. She is placed supine on the operative table. A tourniquet was applied to the right thigh. She was prepped and draped in a sterile fashion over the right knee.   A timeout performed to verify the patient's name, date of birth, medical record number, correct site of surgery and correct procedure to be performed. Was also used to confirm the patient received antibiotics and that all instruments and radiologic studies were available in the room. Once all in attendance were in agreement case began.  The right lower extremity was exsanguinated. The tourniquet was inflated to 275 mm. A longitudinal incision was made, reopening her previous incision 4 cm. Full-thickness skin flaps were developed. The hardware from her previous figure-of-eight  construct were easily palpable. These were exposed with electrocautery. There were easily removed without any significant soft tissue disruption.  Following hardware removal C-arm images were taken in both AP and lateral planes to confirm no retained hardware. The right knee was then flexed to 130. An x-ray in the lateral position was taken with the knee flexed and demonstrated no diastases of the fracture site.  Patient then had a #5 FiberWire placed in a cerclage fashion 360 around the right patella. A second pass of the FiberWire was then placed in a figure-of-eight fashion over the patella as well for added protection against fracture diastases.  The right knee incision was then copiously irrigated. The subcutaneous skin was closed with 2-0 Vicryl and the skin was approximated with staples. Cortical percent Marcaine plain was injected at the surgical site to reduce postoperative pain. A dry, sterile and compressive dressing was applied to the right knee.  Patient was then awoken and brought to the PACU in stable condition. I spoke with her husband in the postop consultation room to inform him that the case had been performed without complication and the patient was stable in recovery room.

## 2016-05-16 NOTE — Discharge Instructions (Signed)
AMBULATORY SURGERY  DISCHARGE INSTRUCTIONS    Patient may weight-bear as tolerated in the right lower extremity. She should keep the right knee dressing clean dry and intact. She may remove the dressing in 3-4 days and cover incision with Band-Aids. She should keep incision dry until follow-up in Dr. Harden Mo office on August 15. She should elevate and ice her right lower extremity whenever possible over the next week. Patient will continue taking her 81 mg aspirin daily, but does not need additional anticoagulation.Marland Kitchen  1) The drugs that you were given will stay in your system until tomorrow so for the next 24 hours you should not:  A) Drive an automobile B) Make any legal decisions C) Drink any alcoholic beverage   2) You may resume regular meals tomorrow.  Today it is better to start with liquids and gradually work up to solid foods.  You may eat anything you prefer, but it is better to start with liquids, then soup and crackers, and gradually work up to solid foods.   3) Please notify your doctor immediately if you have any unusual bleeding, trouble breathing, redness and pain at the surgery site, drainage, fever, or pain not relieved by medication.    4) Additional Instructions:       Please contact your physician with any problems or Same Day Surgery at [phone], Monday through Friday 6 am to 4 pm, or [place] at [facility] number at [phone].

## 2016-05-16 NOTE — Anesthesia Preprocedure Evaluation (Signed)
Anesthesia Evaluation  Patient identified by MRN, date of birth, ID band Patient awake    History of Anesthesia Complications (+) PONV and DIFFICULT AIRWAY  Airway Mallampati: III       Dental  (+) Upper Dentures, Implants   Pulmonary COPD,  COPD inhaler, Current Smoker,    + rhonchi  + decreased breath sounds      Cardiovascular Exercise Tolerance: Good hypertension, Pt. on medications  Rhythm:Regular     Neuro/Psych    GI/Hepatic negative GI ROS, Neg liver ROS,   Endo/Other  Hypothyroidism   Renal/GU negative Renal ROS     Musculoskeletal   Abdominal Normal abdominal exam  (+)   Peds  Hematology   Anesthesia Other Findings   Reproductive/Obstetrics                             Anesthesia Physical Anesthesia Plan  ASA: III  Anesthesia Plan: General   Post-op Pain Management:    Induction: Intravenous  Airway Management Planned: LMA  Additional Equipment:   Intra-op Plan:   Post-operative Plan: Extubation in OR  Informed Consent: I have reviewed the patients History and Physical, chart, labs and discussed the procedure including the risks, benefits and alternatives for the proposed anesthesia with the patient or authorized representative who has indicated his/her understanding and acceptance.     Plan Discussed with: CRNA  Anesthesia Plan Comments:         Anesthesia Quick Evaluation

## 2016-05-16 NOTE — Progress Notes (Signed)
Applied ice pack and elevated leg on pillows

## 2016-05-16 NOTE — Progress Notes (Signed)
zofran given for nausea  

## 2016-05-17 ENCOUNTER — Encounter: Payer: Self-pay | Admitting: Orthopedic Surgery

## 2016-05-18 LAB — SURGICAL PATHOLOGY

## 2016-05-18 NOTE — Anesthesia Postprocedure Evaluation (Signed)
Anesthesia Post Note  Patient: Sydney Coleman  Procedure(s) Performed: Procedure(s) (LRB): HARDWARE REMOVAL (Right)  Patient location during evaluation: PACU Anesthesia Type: General Level of consciousness: awake Pain management: pain level controlled Vital Signs Assessment: post-procedure vital signs reviewed and stable Respiratory status: spontaneous breathing Cardiovascular status: stable Anesthetic complications: no    Last Vitals:  Vitals:   05/16/16 1622 05/16/16 1707  BP: 138/68 (!) 148/84  Pulse: 66 63  Resp: 16 16  Temp:      Last Pain:  Vitals:   05/17/16 0811  TempSrc:   PainSc: 3                  VAN STAVEREN,Andrika Peraza

## 2016-05-28 DIAGNOSIS — M25561 Pain in right knee: Secondary | ICD-10-CM | POA: Diagnosis not present

## 2016-06-01 DIAGNOSIS — S8391XA Sprain of unspecified site of right knee, initial encounter: Secondary | ICD-10-CM | POA: Diagnosis not present

## 2016-06-06 DIAGNOSIS — I10 Essential (primary) hypertension: Secondary | ICD-10-CM | POA: Diagnosis not present

## 2016-06-06 DIAGNOSIS — E041 Nontoxic single thyroid nodule: Secondary | ICD-10-CM | POA: Diagnosis not present

## 2016-06-06 DIAGNOSIS — E782 Mixed hyperlipidemia: Secondary | ICD-10-CM | POA: Diagnosis not present

## 2016-06-06 DIAGNOSIS — D509 Iron deficiency anemia, unspecified: Secondary | ICD-10-CM | POA: Diagnosis not present

## 2016-06-06 DIAGNOSIS — E7800 Pure hypercholesterolemia, unspecified: Secondary | ICD-10-CM | POA: Diagnosis not present

## 2016-06-06 DIAGNOSIS — Z720 Tobacco use: Secondary | ICD-10-CM | POA: Diagnosis not present

## 2016-06-06 DIAGNOSIS — E032 Hypothyroidism due to medicaments and other exogenous substances: Secondary | ICD-10-CM | POA: Diagnosis not present

## 2016-06-11 DIAGNOSIS — E782 Mixed hyperlipidemia: Secondary | ICD-10-CM | POA: Diagnosis not present

## 2016-06-11 DIAGNOSIS — R748 Abnormal levels of other serum enzymes: Secondary | ICD-10-CM | POA: Diagnosis not present

## 2016-06-11 DIAGNOSIS — I10 Essential (primary) hypertension: Secondary | ICD-10-CM | POA: Diagnosis not present

## 2016-06-11 DIAGNOSIS — R7301 Impaired fasting glucose: Secondary | ICD-10-CM | POA: Diagnosis not present

## 2016-06-20 DIAGNOSIS — H16223 Keratoconjunctivitis sicca, not specified as Sjogren's, bilateral: Secondary | ICD-10-CM | POA: Diagnosis not present

## 2016-06-20 DIAGNOSIS — I10 Essential (primary) hypertension: Secondary | ICD-10-CM | POA: Diagnosis not present

## 2016-06-20 DIAGNOSIS — H01009 Unspecified blepharitis unspecified eye, unspecified eyelid: Secondary | ICD-10-CM | POA: Diagnosis not present

## 2016-07-05 ENCOUNTER — Telehealth (HOSPITAL_COMMUNITY): Payer: Self-pay

## 2016-07-05 NOTE — Telephone Encounter (Signed)
Called to schedule 6 month f/u angiogram, left message for pt to return call. AW  

## 2016-08-14 ENCOUNTER — Telehealth (HOSPITAL_COMMUNITY): Payer: Self-pay

## 2016-08-14 NOTE — Telephone Encounter (Signed)
Called to schedule, left message for pt to call and schedule. AW

## 2016-08-15 DIAGNOSIS — R402213 Coma scale, best verbal response, none, at hospital admission: Secondary | ICD-10-CM | POA: Diagnosis not present

## 2016-08-15 DIAGNOSIS — Z880 Allergy status to penicillin: Secondary | ICD-10-CM | POA: Diagnosis not present

## 2016-08-15 DIAGNOSIS — R0602 Shortness of breath: Secondary | ICD-10-CM | POA: Diagnosis not present

## 2016-08-15 DIAGNOSIS — M6289 Other specified disorders of muscle: Secondary | ICD-10-CM | POA: Diagnosis not present

## 2016-08-15 DIAGNOSIS — M6281 Muscle weakness (generalized): Secondary | ICD-10-CM | POA: Diagnosis not present

## 2016-08-15 DIAGNOSIS — I639 Cerebral infarction, unspecified: Secondary | ICD-10-CM | POA: Diagnosis not present

## 2016-08-15 DIAGNOSIS — Z5189 Encounter for other specified aftercare: Secondary | ICD-10-CM | POA: Diagnosis not present

## 2016-08-15 DIAGNOSIS — G936 Cerebral edema: Secondary | ICD-10-CM | POA: Diagnosis not present

## 2016-08-15 DIAGNOSIS — Z4659 Encounter for fitting and adjustment of other gastrointestinal appliance and device: Secondary | ICD-10-CM | POA: Diagnosis not present

## 2016-08-15 DIAGNOSIS — R935 Abnormal findings on diagnostic imaging of other abdominal regions, including retroperitoneum: Secondary | ICD-10-CM | POA: Diagnosis not present

## 2016-08-15 DIAGNOSIS — R918 Other nonspecific abnormal finding of lung field: Secondary | ICD-10-CM | POA: Diagnosis not present

## 2016-08-15 DIAGNOSIS — J9690 Respiratory failure, unspecified, unspecified whether with hypoxia or hypercapnia: Secondary | ICD-10-CM | POA: Diagnosis not present

## 2016-08-15 DIAGNOSIS — R1312 Dysphagia, oropharyngeal phase: Secondary | ICD-10-CM | POA: Diagnosis not present

## 2016-08-15 DIAGNOSIS — R29810 Facial weakness: Secondary | ICD-10-CM | POA: Diagnosis not present

## 2016-08-15 DIAGNOSIS — I6201 Nontraumatic acute subdural hemorrhage: Secondary | ICD-10-CM | POA: Diagnosis not present

## 2016-08-15 DIAGNOSIS — R402133 Coma scale, eyes open, to sound, at hospital admission: Secondary | ICD-10-CM | POA: Diagnosis not present

## 2016-08-15 DIAGNOSIS — Z881 Allergy status to other antibiotic agents status: Secondary | ICD-10-CM | POA: Diagnosis not present

## 2016-08-15 DIAGNOSIS — J9600 Acute respiratory failure, unspecified whether with hypoxia or hypercapnia: Secondary | ICD-10-CM | POA: Diagnosis not present

## 2016-08-15 DIAGNOSIS — R293 Abnormal posture: Secondary | ICD-10-CM | POA: Diagnosis not present

## 2016-08-15 DIAGNOSIS — I10 Essential (primary) hypertension: Secondary | ICD-10-CM | POA: Diagnosis not present

## 2016-08-15 DIAGNOSIS — R739 Hyperglycemia, unspecified: Secondary | ICD-10-CM | POA: Diagnosis not present

## 2016-08-15 DIAGNOSIS — G8191 Hemiplegia, unspecified affecting right dominant side: Secondary | ICD-10-CM | POA: Diagnosis not present

## 2016-08-15 DIAGNOSIS — R928 Other abnormal and inconclusive findings on diagnostic imaging of breast: Secondary | ICD-10-CM | POA: Diagnosis not present

## 2016-08-15 DIAGNOSIS — E039 Hypothyroidism, unspecified: Secondary | ICD-10-CM | POA: Diagnosis not present

## 2016-08-15 DIAGNOSIS — I619 Nontraumatic intracerebral hemorrhage, unspecified: Secondary | ICD-10-CM | POA: Diagnosis not present

## 2016-08-15 DIAGNOSIS — Z4689 Encounter for fitting and adjustment of other specified devices: Secondary | ICD-10-CM | POA: Diagnosis not present

## 2016-08-15 DIAGNOSIS — I629 Nontraumatic intracranial hemorrhage, unspecified: Secondary | ICD-10-CM | POA: Diagnosis not present

## 2016-08-15 DIAGNOSIS — E785 Hyperlipidemia, unspecified: Secondary | ICD-10-CM | POA: Diagnosis not present

## 2016-08-15 DIAGNOSIS — I69920 Aphasia following unspecified cerebrovascular disease: Secondary | ICD-10-CM | POA: Diagnosis not present

## 2016-08-15 DIAGNOSIS — I6200 Nontraumatic subdural hemorrhage, unspecified: Secondary | ICD-10-CM | POA: Diagnosis not present

## 2016-08-15 DIAGNOSIS — J449 Chronic obstructive pulmonary disease, unspecified: Secondary | ICD-10-CM | POA: Diagnosis not present

## 2016-08-15 DIAGNOSIS — I615 Nontraumatic intracerebral hemorrhage, intraventricular: Secondary | ICD-10-CM | POA: Diagnosis not present

## 2016-08-24 DIAGNOSIS — Z4682 Encounter for fitting and adjustment of non-vascular catheter: Secondary | ICD-10-CM | POA: Diagnosis not present

## 2016-08-24 DIAGNOSIS — Z823 Family history of stroke: Secondary | ICD-10-CM | POA: Diagnosis not present

## 2016-08-24 DIAGNOSIS — G969 Disorder of central nervous system, unspecified: Secondary | ICD-10-CM | POA: Diagnosis not present

## 2016-08-24 DIAGNOSIS — E119 Type 2 diabetes mellitus without complications: Secondary | ICD-10-CM | POA: Diagnosis not present

## 2016-08-24 DIAGNOSIS — A419 Sepsis, unspecified organism: Secondary | ICD-10-CM | POA: Diagnosis not present

## 2016-08-24 DIAGNOSIS — Z90710 Acquired absence of both cervix and uterus: Secondary | ICD-10-CM | POA: Diagnosis not present

## 2016-08-24 DIAGNOSIS — N390 Urinary tract infection, site not specified: Secondary | ICD-10-CM | POA: Diagnosis not present

## 2016-08-24 DIAGNOSIS — Z96642 Presence of left artificial hip joint: Secondary | ICD-10-CM | POA: Diagnosis not present

## 2016-08-24 DIAGNOSIS — E785 Hyperlipidemia, unspecified: Secondary | ICD-10-CM | POA: Diagnosis not present

## 2016-08-24 DIAGNOSIS — Z8544 Personal history of malignant neoplasm of other female genital organs: Secondary | ICD-10-CM | POA: Diagnosis not present

## 2016-08-24 DIAGNOSIS — I69920 Aphasia following unspecified cerebrovascular disease: Secondary | ICD-10-CM | POA: Diagnosis not present

## 2016-08-24 DIAGNOSIS — A86 Unspecified viral encephalitis: Secondary | ICD-10-CM | POA: Diagnosis not present

## 2016-08-24 DIAGNOSIS — G009 Bacterial meningitis, unspecified: Secondary | ICD-10-CM | POA: Diagnosis not present

## 2016-08-24 DIAGNOSIS — G060 Intracranial abscess and granuloma: Secondary | ICD-10-CM | POA: Diagnosis not present

## 2016-08-24 DIAGNOSIS — R0603 Acute respiratory distress: Secondary | ICD-10-CM | POA: Diagnosis not present

## 2016-08-24 DIAGNOSIS — Z9911 Dependence on respirator [ventilator] status: Secondary | ICD-10-CM | POA: Diagnosis not present

## 2016-08-24 DIAGNOSIS — Z8673 Personal history of transient ischemic attack (TIA), and cerebral infarction without residual deficits: Secondary | ICD-10-CM | POA: Diagnosis not present

## 2016-08-24 DIAGNOSIS — A499 Bacterial infection, unspecified: Secondary | ICD-10-CM | POA: Diagnosis not present

## 2016-08-24 DIAGNOSIS — R740 Nonspecific elevation of levels of transaminase and lactic acid dehydrogenase [LDH]: Secondary | ICD-10-CM | POA: Diagnosis not present

## 2016-08-24 DIAGNOSIS — J9601 Acute respiratory failure with hypoxia: Secondary | ICD-10-CM | POA: Diagnosis not present

## 2016-08-24 DIAGNOSIS — J449 Chronic obstructive pulmonary disease, unspecified: Secondary | ICD-10-CM | POA: Diagnosis not present

## 2016-08-24 DIAGNOSIS — I471 Supraventricular tachycardia: Secondary | ICD-10-CM | POA: Diagnosis not present

## 2016-08-24 DIAGNOSIS — G8190 Hemiplegia, unspecified affecting unspecified side: Secondary | ICD-10-CM | POA: Diagnosis not present

## 2016-08-24 DIAGNOSIS — Z9889 Other specified postprocedural states: Secondary | ICD-10-CM | POA: Diagnosis not present

## 2016-08-24 DIAGNOSIS — E870 Hyperosmolality and hypernatremia: Secondary | ICD-10-CM | POA: Diagnosis not present

## 2016-08-24 DIAGNOSIS — M6289 Other specified disorders of muscle: Secondary | ICD-10-CM | POA: Diagnosis not present

## 2016-08-24 DIAGNOSIS — E871 Hypo-osmolality and hyponatremia: Secondary | ICD-10-CM | POA: Diagnosis not present

## 2016-08-24 DIAGNOSIS — Z5189 Encounter for other specified aftercare: Secondary | ICD-10-CM | POA: Diagnosis not present

## 2016-08-24 DIAGNOSIS — I619 Nontraumatic intracerebral hemorrhage, unspecified: Secondary | ICD-10-CM | POA: Diagnosis not present

## 2016-08-24 DIAGNOSIS — I615 Nontraumatic intracerebral hemorrhage, intraventricular: Secondary | ICD-10-CM | POA: Diagnosis not present

## 2016-08-24 DIAGNOSIS — F17200 Nicotine dependence, unspecified, uncomplicated: Secondary | ICD-10-CM | POA: Diagnosis not present

## 2016-08-24 DIAGNOSIS — A498 Other bacterial infections of unspecified site: Secondary | ICD-10-CM | POA: Diagnosis not present

## 2016-08-24 DIAGNOSIS — R293 Abnormal posture: Secondary | ICD-10-CM | POA: Diagnosis not present

## 2016-08-24 DIAGNOSIS — E876 Hypokalemia: Secondary | ICD-10-CM | POA: Diagnosis not present

## 2016-08-24 DIAGNOSIS — R7881 Bacteremia: Secondary | ICD-10-CM | POA: Diagnosis not present

## 2016-08-24 DIAGNOSIS — Z8679 Personal history of other diseases of the circulatory system: Secondary | ICD-10-CM | POA: Diagnosis not present

## 2016-08-24 DIAGNOSIS — G936 Cerebral edema: Secondary | ICD-10-CM | POA: Diagnosis not present

## 2016-08-24 DIAGNOSIS — I609 Nontraumatic subarachnoid hemorrhage, unspecified: Secondary | ICD-10-CM | POA: Diagnosis not present

## 2016-08-24 DIAGNOSIS — M6281 Muscle weakness (generalized): Secondary | ICD-10-CM | POA: Diagnosis not present

## 2016-08-24 DIAGNOSIS — F17210 Nicotine dependence, cigarettes, uncomplicated: Secondary | ICD-10-CM | POA: Diagnosis not present

## 2016-08-24 DIAGNOSIS — G0490 Encephalitis and encephalomyelitis, unspecified: Secondary | ICD-10-CM | POA: Diagnosis not present

## 2016-08-24 DIAGNOSIS — R1111 Vomiting without nausea: Secondary | ICD-10-CM | POA: Diagnosis not present

## 2016-08-24 DIAGNOSIS — I6789 Other cerebrovascular disease: Secondary | ICD-10-CM | POA: Diagnosis not present

## 2016-08-24 DIAGNOSIS — I6200 Nontraumatic subdural hemorrhage, unspecified: Secondary | ICD-10-CM | POA: Diagnosis not present

## 2016-08-24 DIAGNOSIS — R4182 Altered mental status, unspecified: Secondary | ICD-10-CM | POA: Diagnosis present

## 2016-08-24 DIAGNOSIS — I959 Hypotension, unspecified: Secondary | ICD-10-CM | POA: Diagnosis not present

## 2016-08-24 DIAGNOSIS — R112 Nausea with vomiting, unspecified: Secondary | ICD-10-CM | POA: Diagnosis not present

## 2016-08-24 DIAGNOSIS — G968 Other specified disorders of central nervous system: Secondary | ICD-10-CM | POA: Diagnosis not present

## 2016-08-24 DIAGNOSIS — E039 Hypothyroidism, unspecified: Secondary | ICD-10-CM | POA: Diagnosis not present

## 2016-08-24 DIAGNOSIS — R1312 Dysphagia, oropharyngeal phase: Secondary | ICD-10-CM | POA: Diagnosis not present

## 2016-08-24 DIAGNOSIS — I69051 Hemiplegia and hemiparesis following nontraumatic subarachnoid hemorrhage affecting right dominant side: Secondary | ICD-10-CM | POA: Diagnosis not present

## 2016-08-24 DIAGNOSIS — G008 Other bacterial meningitis: Secondary | ICD-10-CM | POA: Diagnosis not present

## 2016-08-24 DIAGNOSIS — Z9882 Breast implant status: Secondary | ICD-10-CM | POA: Diagnosis not present

## 2016-08-24 DIAGNOSIS — G039 Meningitis, unspecified: Secondary | ICD-10-CM | POA: Diagnosis not present

## 2016-08-24 DIAGNOSIS — B9689 Other specified bacterial agents as the cause of diseases classified elsewhere: Secondary | ICD-10-CM | POA: Diagnosis not present

## 2016-08-24 DIAGNOSIS — R930 Abnormal findings on diagnostic imaging of skull and head, not elsewhere classified: Secondary | ICD-10-CM | POA: Diagnosis not present

## 2016-08-24 DIAGNOSIS — J9600 Acute respiratory failure, unspecified whether with hypoxia or hypercapnia: Secondary | ICD-10-CM | POA: Diagnosis not present

## 2016-08-24 DIAGNOSIS — I10 Essential (primary) hypertension: Secondary | ICD-10-CM | POA: Diagnosis not present

## 2016-08-24 DIAGNOSIS — Z79899 Other long term (current) drug therapy: Secondary | ICD-10-CM | POA: Diagnosis not present

## 2016-08-24 DIAGNOSIS — Z8249 Family history of ischemic heart disease and other diseases of the circulatory system: Secondary | ICD-10-CM | POA: Diagnosis not present

## 2016-08-26 DIAGNOSIS — I619 Nontraumatic intracerebral hemorrhage, unspecified: Secondary | ICD-10-CM | POA: Diagnosis not present

## 2016-08-26 DIAGNOSIS — E119 Type 2 diabetes mellitus without complications: Secondary | ICD-10-CM | POA: Diagnosis not present

## 2016-08-26 DIAGNOSIS — I10 Essential (primary) hypertension: Secondary | ICD-10-CM | POA: Diagnosis not present

## 2016-08-26 DIAGNOSIS — E039 Hypothyroidism, unspecified: Secondary | ICD-10-CM | POA: Diagnosis not present

## 2016-08-31 ENCOUNTER — Emergency Department
Admission: EM | Admit: 2016-08-31 | Discharge: 2016-09-01 | Payer: Medicare Other | Attending: Emergency Medicine | Admitting: Emergency Medicine

## 2016-08-31 ENCOUNTER — Emergency Department: Payer: Medicare Other

## 2016-08-31 DIAGNOSIS — R112 Nausea with vomiting, unspecified: Secondary | ICD-10-CM | POA: Diagnosis not present

## 2016-08-31 DIAGNOSIS — N390 Urinary tract infection, site not specified: Secondary | ICD-10-CM | POA: Diagnosis not present

## 2016-08-31 DIAGNOSIS — Z79899 Other long term (current) drug therapy: Secondary | ICD-10-CM | POA: Diagnosis not present

## 2016-08-31 DIAGNOSIS — E039 Hypothyroidism, unspecified: Secondary | ICD-10-CM | POA: Insufficient documentation

## 2016-08-31 DIAGNOSIS — R740 Nonspecific elevation of levels of transaminase and lactic acid dehydrogenase [LDH]: Secondary | ICD-10-CM | POA: Diagnosis not present

## 2016-08-31 DIAGNOSIS — R930 Abnormal findings on diagnostic imaging of skull and head, not elsewhere classified: Secondary | ICD-10-CM | POA: Diagnosis not present

## 2016-08-31 DIAGNOSIS — R4182 Altered mental status, unspecified: Secondary | ICD-10-CM

## 2016-08-31 DIAGNOSIS — J449 Chronic obstructive pulmonary disease, unspecified: Secondary | ICD-10-CM | POA: Insufficient documentation

## 2016-08-31 DIAGNOSIS — A419 Sepsis, unspecified organism: Secondary | ICD-10-CM | POA: Diagnosis not present

## 2016-08-31 DIAGNOSIS — I10 Essential (primary) hypertension: Secondary | ICD-10-CM | POA: Insufficient documentation

## 2016-08-31 DIAGNOSIS — F17210 Nicotine dependence, cigarettes, uncomplicated: Secondary | ICD-10-CM | POA: Insufficient documentation

## 2016-08-31 DIAGNOSIS — I609 Nontraumatic subarachnoid hemorrhage, unspecified: Secondary | ICD-10-CM | POA: Diagnosis not present

## 2016-08-31 DIAGNOSIS — R7989 Other specified abnormal findings of blood chemistry: Secondary | ICD-10-CM

## 2016-08-31 DIAGNOSIS — G969 Disorder of central nervous system, unspecified: Secondary | ICD-10-CM | POA: Diagnosis not present

## 2016-08-31 LAB — URINALYSIS COMPLETE WITH MICROSCOPIC (ARMC ONLY)
Bilirubin Urine: NEGATIVE
Glucose, UA: >500 mg/dL — AB
KETONES UR: NEGATIVE mg/dL
Nitrite: NEGATIVE
PROTEIN: 100 mg/dL — AB
RBC / HPF: NONE SEEN RBC/hpf (ref 0–5)
SQUAMOUS EPITHELIAL / LPF: NONE SEEN
Specific Gravity, Urine: 1.011 (ref 1.005–1.030)
pH: 5 (ref 5.0–8.0)

## 2016-08-31 LAB — COMPREHENSIVE METABOLIC PANEL
ALK PHOS: 197 U/L — AB (ref 38–126)
ALT: 26 U/L (ref 14–54)
AST: 22 U/L (ref 15–41)
Albumin: 3.5 g/dL (ref 3.5–5.0)
Anion gap: 14 (ref 5–15)
BILIRUBIN TOTAL: 0.7 mg/dL (ref 0.3–1.2)
BUN: 16 mg/dL (ref 6–20)
CALCIUM: 9.7 mg/dL (ref 8.9–10.3)
CO2: 24 mmol/L (ref 22–32)
CREATININE: 0.84 mg/dL (ref 0.44–1.00)
Chloride: 94 mmol/L — ABNORMAL LOW (ref 101–111)
Glucose, Bld: 261 mg/dL — ABNORMAL HIGH (ref 65–99)
Potassium: 3.4 mmol/L — ABNORMAL LOW (ref 3.5–5.1)
Sodium: 132 mmol/L — ABNORMAL LOW (ref 135–145)
TOTAL PROTEIN: 9.1 g/dL — AB (ref 6.5–8.1)

## 2016-08-31 LAB — CBC WITH DIFFERENTIAL/PLATELET
BASOS ABS: 0.2 10*3/uL — AB (ref 0–0.1)
Basophils Relative: 1 %
EOS PCT: 0 %
Eosinophils Absolute: 0 10*3/uL (ref 0–0.7)
HCT: 45.3 % (ref 35.0–47.0)
Hemoglobin: 15.7 g/dL (ref 12.0–16.0)
LYMPHS PCT: 3 %
Lymphs Abs: 0.6 10*3/uL — ABNORMAL LOW (ref 1.0–3.6)
MCH: 32.7 pg (ref 26.0–34.0)
MCHC: 34.8 g/dL (ref 32.0–36.0)
MCV: 94.1 fL (ref 80.0–100.0)
MONO ABS: 1.3 10*3/uL — AB (ref 0.2–0.9)
Monocytes Relative: 6 %
Neutro Abs: 20.6 10*3/uL — ABNORMAL HIGH (ref 1.4–6.5)
Neutrophils Relative %: 90 %
PLATELETS: 471 10*3/uL — AB (ref 150–440)
RBC: 4.81 MIL/uL (ref 3.80–5.20)
RDW: 13 % (ref 11.5–14.5)
WBC: 22.7 10*3/uL — ABNORMAL HIGH (ref 3.6–11.0)

## 2016-08-31 LAB — PROTIME-INR
INR: 1.02
Prothrombin Time: 13.4 seconds (ref 11.4–15.2)

## 2016-08-31 LAB — TROPONIN I

## 2016-08-31 LAB — LACTIC ACID, PLASMA: Lactic Acid, Venous: 2.5 mmol/L (ref 0.5–1.9)

## 2016-08-31 LAB — GLUCOSE, CAPILLARY: GLUCOSE-CAPILLARY: 241 mg/dL — AB (ref 65–99)

## 2016-08-31 LAB — LIPASE, BLOOD: LIPASE: 18 U/L (ref 11–51)

## 2016-08-31 LAB — APTT: aPTT: 29 seconds (ref 24–36)

## 2016-08-31 MED ORDER — SODIUM CHLORIDE 0.9 % IV SOLN
1000.0000 mg | INTRAVENOUS | Status: AC
  Administered 2016-08-31: 1000 mg via INTRAVENOUS
  Filled 2016-08-31: qty 10

## 2016-08-31 MED ORDER — SODIUM CHLORIDE 0.9 % IV BOLUS (SEPSIS)
1000.0000 mL | Freq: Once | INTRAVENOUS | Status: AC
  Administered 2016-08-31: 1000 mL via INTRAVENOUS

## 2016-08-31 MED ORDER — AZTREONAM 2 G IJ SOLR
2.0000 g | Freq: Once | INTRAMUSCULAR | Status: DC
  Filled 2016-08-31: qty 2

## 2016-08-31 MED ORDER — NIMODIPINE 30 MG PO CAPS
60.0000 mg | ORAL_CAPSULE | ORAL | Status: DC
  Filled 2016-08-31: qty 2

## 2016-08-31 MED ORDER — SODIUM CHLORIDE 0.9 % IV BOLUS (SEPSIS): 1000.0000 mL | Freq: Once | INTRAVENOUS | Status: DC

## 2016-08-31 MED ORDER — MORPHINE SULFATE (PF) 4 MG/ML IV SOLN
4.0000 mg | Freq: Once | INTRAVENOUS | Status: AC
  Administered 2016-08-31: 4 mg via INTRAVENOUS
  Filled 2016-08-31: qty 1

## 2016-08-31 MED ORDER — SODIUM CHLORIDE 0.9 % IV BOLUS (SEPSIS): 1000.0000 mL | INTRAVENOUS | Status: DC

## 2016-08-31 MED ORDER — LABETALOL HCL 5 MG/ML IV SOLN
10.0000 mg | Freq: Once | INTRAVENOUS | Status: AC
  Administered 2016-08-31: 10 mg via INTRAVENOUS
  Filled 2016-08-31: qty 4

## 2016-08-31 MED ORDER — LEVOFLOXACIN IN D5W 750 MG/150ML IV SOLN
750.0000 mg | Freq: Once | INTRAVENOUS | Status: AC
  Administered 2016-08-31: 750 mg via INTRAVENOUS
  Filled 2016-08-31: qty 150

## 2016-08-31 MED ORDER — ONDANSETRON HCL 4 MG/2ML IJ SOLN
4.0000 mg | INTRAMUSCULAR | Status: AC
  Administered 2016-08-31: 4 mg via INTRAVENOUS
  Filled 2016-08-31: qty 2

## 2016-08-31 NOTE — ED Notes (Signed)
Updated MD Karma Greaser that patient no longer following commands

## 2016-08-31 NOTE — ED Triage Notes (Signed)
Per patient's family: Pt had stroke 11/1, craniotomy 11/2 to remove clot. [facility] from [facility]. Pt normally able to eat, speak, etc.   Pt began to have nausea/vomiting yesterday. Pt's eyes began to look left at approx 8am this morning. [name] reports when she got to [facility] at 1800 today patient no longer verbal

## 2016-08-31 NOTE — ED Provider Notes (Signed)
[facility] Emergency Department Provider Note  ____________________________________________   First MD Initiated Contact with Patient 08/31/16 2227     (approximate)  I have reviewed the triage vital signs and the nursing notes.   HISTORY  Chief Complaint Altered Mental Status  The patient has acute altered mental status, history is provided by family  HPI Sydney Coleman is a 65 y.o. female with a complicated medical history that most recently includes hemorrhagic stroke  approximately 17 days ago  while she was in [place] resulting in craniotomy and clot evacuation. She presents tonight by EMS for evaluation of gradually worsening mental status since early this morning.  She is currently at [facility] for rehabilitation,, and her family reports that her  current status  is  alert and oriented times three,  conversant, and able to eat without difficulty. They report that  yesterday the patient started complaining of nausea and vomiting. Reportedly she was starting to  act unusual and be less responsive at 8 AM today  including  with some left gaze preference but not consistently so. By tonight she is nonverbal and appears acutely ill.  She was transported by EMS for further evaluation. Her family is present at bedside and states that this is very far from her post procedural baseline.  No other review of systems is available. Her last known normal was last night with only nausea and vomiting reported as symptoms yesterday. She is ill appearing upon arrival with tachycardia, tachypnea,  altered mental status, but afebrile  and normotensive ( in fact slightly hypertensive).   Past Medical History:  Diagnosis Date  . Acute spontaneous subarachnoid intracranial hemorrhage (HCC)   . Anxiety    better since pin removed;   . Arthritis   . Cancer of vulva ([place])    >15 year ago;   . Chronic headaches   . COPD (chronic obstructive pulmonary disease) ([place])     . Difficult intubation    two (2) teeth broken during intubation  . Elevated lipids   . Hypertension    controlled with medication;   . Hypothyroidism   . PONV (postoperative nausea and vomiting)   . SAH (subarachnoid hemorrhage) ([place]) 05/11/2015  . Stroke [facility])    TIA  . TIA (transient ischemic attack) 2008    Patient Active Problem List   Diagnosis Date Noted  . Patella fracture 08/10/2015  . SAH (subarachnoid hemorrhage) ([place]) 05/12/2015    Past Surgical History:  Procedure Laterality Date  . ABDOMINAL HYSTERECTOMY    . BREAST SURGERY    . EYE SURGERY Right    Cataract Extraction  . HARDWARE REMOVAL Right 10/27/2015   Procedure: HARDWARE REMOVAL;  Surgeon: Thornton Park, MD;  Location: [facility] ORS;  Service: Orthopedics;  Laterality: Right;  . HARDWARE REMOVAL Right 05/16/2016   Procedure: HARDWARE REMOVAL;  Surgeon: Thornton Park, MD;  Location: [facility] ORS;  Service: Orthopedics;  Laterality: Right;  . JOINT REPLACEMENT Left    Partial Hip Replacement  . ORIF PATELLA Right 08/10/2015   Procedure: OPEN REDUCTION INTERNAL (ORIF) FIXATION PATELLA TENSION BAND WIRING;  Surgeon: Thornton Park, MD;  Location: [facility] ORS;  Service: Orthopedics;  Laterality: Right;  . PLACEMENT OF BREAST IMPLANTS    . THYROIDECTOMY, PARTIAL    . TONSILLECTOMY    . TOTAL HIP ARTHROPLASTY Left     Prior to Admission medications   Medication Sig Start Date End Date Taking? Authorizing Provider  aspirin 81 MG tablet Take 81 mg  by mouth daily.    Historical Provider, MD  butalbital-acetaminophen-caffeine (FIORICET, ESGIC) 50-325-40 MG tablet Take 1 tablet by mouth every 4 (four) hours as needed for headache.    Historical Provider, MD  clonazePAM (KLONOPIN) 0.5 MG tablet Take 0.5-1 mg by mouth daily as needed for anxiety.     Historical Provider, MD  cyanocobalamin 100 MCG tablet Take 100 mcg by mouth daily. Reported on 11/10/2015    Historical Provider, MD  doxycycline (VIBRA-TABS) 100 MG tablet  Take 100 mg by mouth 2 (two) times daily.    Historical Provider, MD  FOLIC ACID PO Take [id number] mg by mouth daily.     Historical Provider, MD  HYDROcodone-acetaminophen (NORCO) 5-325 MG tablet Take 1-2 tablets by mouth every 4 (four) hours as needed for moderate pain. MAXIMUM TOTAL ACETAMINOPHEN DOSE IS 4000 MG PER DAY 05/16/16   Thornton Park, MD  levothyroxine (SYNTHROID, LEVOTHROID) 25 MCG tablet Take 25 mcg by mouth daily before breakfast.    Historical Provider, MD  losartan (COZAAR) 25 MG tablet Take 25 mg by mouth daily.     Historical Provider, MD  ondansetron (ZOFRAN) 4 MG tablet Take 1 tablet (4 mg total) by mouth every 8 (eight) hours as needed for nausea or vomiting. 05/16/16   Thornton Park, MD  pravastatin (PRAVACHOL) 20 MG tablet Take 20 mg by mouth daily.    Historical Provider, MD  tobramycin-dexamethasone [facility]) ophthalmic solution Place 1 drop into both eyes 3 (three) times daily.    Historical Provider, MD    Allergies Penicillins; Amoxil [amoxicillin]; Augmentin [amoxicillin-pot clavulanate]; Other; and Vancomycin  Family History  Problem Relation Age of Onset  . Congestive Heart Failure Father   . Stroke Mother   . Heart disease Mother     Social History Social History  Substance Use Topics  . Smoking status: Current Every Day Smoker    Packs/day: 1.00    Types: Cigarettes  . Smokeless tobacco: Never Used     Comment: Refuses to tell how much she smokes   . Alcohol use No    Review of Systems  llevel V caveat-the patient is acutely ill and unable to provide review of systems ____________________________________________   PHYSICAL EXAM:  VITAL SIGNS: ED Triage Vitals [08/31/16 2144]  Enc Vitals Group     BP (!) 165/89     Pulse Rate (!) 118     Resp (!) 27     Temp 98.2 F (36.8 C)     Temp Source Oral     SpO2 100 %     Weight 134 lb 11.2 oz (61.1 kg)     Height 5\' 3"  (1.6 m)     Head Circumference      [place]      Pain Score      Pain  Loc      Pain Edu?      Excl. in [place]?     Constitutional: awake, responds to simple commands and to voice, will track people in the room but quickly loses attention. Appears acutely ill  in the setting of chronic or subacute  deconditioning Eyes: Conjunctivae are normal. PERRL but sluggish. EOMI without obvious gaze preference or "locked gaze" in a particular direction to suggest seizure Head: Atraumatic. Nose: No congestion/rhinnorhea. Mouth/Throat: Mucous membranes are moist.  Oropharynx non-erythematous. Neck: No stridor.  No meningeal signs.   Cardiovascular: tachycardia, regular rhythm. Good peripheral circulation. Grossly normal heart sounds. Respiratory: Normal respiratory effort but increased rate.  No  retractions. Lungs CTAB. Gastrointestinal: Soft and nontender. No distention.  Musculoskeletal: no gross deformities, moving all four extremities, no lower extremity edema Neurologic:  the patient is moving all four extremities and is in fact grabbing at her IV and picking at objects in the air that do not seem to exist.  She cannot participate in a neurological exam but she is protecting her airway in response to simple commands such as "breathe deeply" and "look at me".  She has no obvious facial droop.   Skin:  Skin is warm, dry and intact. No rash noted.   ____________________________________________   LABS (all labs ordered are listed, but only abnormal results are displayed)  Labs Reviewed  LACTIC ACID, PLASMA - Abnormal; Notable for the following:       Result Value   Lactic Acid, Venous 2.5 (*)    All other components within normal limits  COMPREHENSIVE METABOLIC PANEL - Abnormal; Notable for the following:    Sodium 132 (*)    Potassium 3.4 (*)    Chloride 94 (*)    Glucose, Bld 261 (*)    Total Protein 9.1 (*)    Alkaline Phosphatase 197 (*)    All other components within normal limits  CBC WITH DIFFERENTIAL/PLATELET - Abnormal; Notable for the following:    WBC  22.7 (*)    Platelets 471 (*)    Neutro Abs 20.6 (*)    Lymphs Abs 0.6 (*)    Monocytes Absolute 1.3 (*)    Basophils Absolute 0.2 (*)    All other components within normal limits  URINALYSIS COMPLETEWITH MICROSCOPIC ([facility] ONLY) - Abnormal; Notable for the following:    Color, Urine YELLOW (*)    APPearance TURBID (*)    Glucose, UA >500 (*)    Hgb urine dipstick 2+ (*)    Protein, ur 100 (*)    Leukocytes, UA 2+ (*)    Bacteria, UA FEW (*)    All other components within normal limits  GLUCOSE, CAPILLARY - Abnormal; Notable for the following:    Glucose-Capillary 241 (*)    All other components within normal limits  CULTURE, BLOOD (ROUTINE X 2)  CULTURE, BLOOD (ROUTINE X 2)  URINE CULTURE  LIPASE, BLOOD  TROPONIN I  APTT  PROTIME-INR  LACTIC ACID, PLASMA   ____________________________________________  EKG  ED ECG REPORT I, Renell Allum, the attending physician, personally viewed and interpreted this ECG.  Date: 08/31/2016 EKG Time: 21:40 Rate: 119 Rhythm: Sinus tachycardia QRS Axis: normal Intervals: normal ST/T Wave abnormalities: Non-specific ST segment / T-wave changes, but no evidence of acute ischemia. Conduction Disturbances: none Narrative Interpretation: unremarkable  ____________________________________________  RADIOLOGY Bennie Hind, Junah Yam, personally viewed and discussed these images and results by phone with the on-call radiologist and used this discussion as part of my medical decision making.    Ct Head Wo Contrast  Result Date: 08/31/2016 CLINICAL DATA:  Acute onset of nausea and vomiting. Left-sided gaze. Recent CVA, with craniotomy. Initial encounter. EXAM: CT HEAD WITHOUT CONTRAST TECHNIQUE: Contiguous axial images were obtained from the base of the skull through the vertex without intravenous contrast. COMPARISON:  CT of the head performed 01/02/2016 FINDINGS: Brain: Likely subarachnoid hemorrhage is noted at the left convexity, and also at the  right occipital lobe. There is packing material at the left convexity, reflecting recent evacuation of a hematoma. Evolving subacute infarct is noted at the left cerebral hemisphere, primarily involving the white matter. Chronic encephalomalacia is noted at the white matter of  the right frontal lobe, reflecting remote infarct. Scattered periventricular white matter change likely reflects small vessel ischemic microangiopathy. The brainstem and fourth ventricle are within normal limits. The basal ganglia are unremarkable in appearance. No midline shift is seen. Vascular: No hyperdense vessel or unexpected calcification. Skull: There is no evidence of fracture; the patient's craniotomy at the left vertex is unremarkable in appearance. Sinuses/Orbits: The visualized portions of the orbits are within normal limits. There is mild partial opacification of the right mastoid air cells. The visualized paranasal sinuses and left mastoid air cells are well-aerated. Other: No significant soft tissue abnormalities are seen. IMPRESSION: 1. Likely subarachnoid hemorrhage at the left convexity, and also at the right occipital lobe. Packing material at the left convexity, reflecting recent evacuation of a hematoma. 2. Evolving subacute infarct at the left cerebral hemisphere, primarily involving the white matter. 3. Chronic encephalomalacia at the white matter of the right frontal lobe, reflecting remote infarct. Scattered small vessel ischemic microangiopathy. 4. Mild partial opacification of the right mastoid air cells. These results were called by telephone at the time of interpretation on 08/31/2016 at 10:24 pm to Dr. Hinda Kehr, who verbally acknowledged these results. Electronically Signed   By: Garald Balding M.D.   On: 08/31/2016 22:28   Dg Chest Port 1 View  Result Date: 08/31/2016 CLINICAL DATA:  Acute onset of sepsis. Recent CVA. Left-sided gaze. Nausea and vomiting. Initial encounter. EXAM: PORTABLE CHEST 1 VIEW  COMPARISON:  Chest radiograph performed 08/09/2015 FINDINGS: The lungs are well-aerated. Peribronchial thickening is noted. Mild vascular congestion is seen. There is no evidence of pleural effusion or pneumothorax. The cardiomediastinal silhouette is within normal limits. No acute osseous abnormalities are seen. IMPRESSION: Peribronchial thickening noted.  Mild vascular congestion is seen. Electronically Signed   By: Garald Balding M.D.   On: 08/31/2016 22:29    ____________________________________________   PROCEDURES  Procedure(s) performed:   .Critical Care Performed by: Hinda Kehr Authorized by: Hinda Kehr   Critical care provider statement:    Critical care time (minutes):  60   Critical care time was exclusive of:  Separately billable procedures and treating other patients   Critical care was necessary to treat or prevent imminent or life-threatening deterioration of the following conditions:  CNS failure or compromise and sepsis   Critical care was time spent personally by me on the following activities:  Development of treatment plan with patient or surrogate, discussions with consultants, evaluation of patient's response to treatment, examination of patient, obtaining history from patient or surrogate, ordering and performing treatments and interventions, ordering and review of laboratory studies, ordering and review of radiographic studies, pulse oximetry, re-evaluation of patient's condition and review of old charts     Critical Care performed: Yes, see critical care procedure note(s) ____________________________________________   INITIAL IMPRESSION / [place] / ED COURSE  Pertinent labs & imaging results that were available during my care of the patient were reviewed by me and considered in my medical decision making (see chart for details).  the patient appears acutely ill, and I am most concerned about sepsis,  intracranial hemorrhage versus new  ischemic CVA,  or a combination of the two. I have called her a code sepsis and will begin empiric treatment, labs/cultures, etc. However I am sending her first for an emergent head CT to evaluate for acute intracranial injuries.   Clinical Course as of Sep 01 120  Ludwig Clarks Aug 31, 2016  2237 Dr. Radene Knee with radiology called me to  let me know about 2 foci of acute subarachnoid hemorrhage.  This fits clinically with her developing and worsening presentation.  I am proceeding with sepsis workup as well given her vital sign abnormalities but these are likely the result of her subarachnoid hemorrhage.  I had an extensive discussion with the spouse and the daughter and they both report that the patient still wants to be full code.  She has been to [facility] in the past and seen neurosurgery at [facility] previously although her recent surgeries were in [place].  I am going to call and speak with neurology or neuro intensivist at [facility] and ask for their guidance.  [CF]  2243 Stopping sepsis protocol given the presence of the subarachnoid bleeding.  We have already sent off cultures and she is already getting the Levaquin which I think is a good start but is much more likely that her vital sign abnormalities and even her leukocytosis is the result of her acute intracranial bleeding.  [CF]  2302 Spoke by phone with Dr. Jeneen Montgomery [facility] neurology).  We had an extensive discussion, and he stated that he would be comfortable admitting her from the [facility] ED, but not directly from [facility]  Spoke with Dr. Linwood Dibbles in the [facility] ED, discussed extensively, he agreed with the management thus far and accepted the transfer.    [CF]  2313 Reassessed patient, she has waxing/waning mental status, but is still responding to commands.  Protecting airway.  HR up to 130  [CF]  2315 Lactic acid elevated, continuing fluids, patient getting Levaquin, transferring to [facility].  [CF]  2343 +UTI (though nitrite negative).  Already received  Levaquin. Will give another 1L NS for the 43mL/kg goal for sepsis.  [CF]    Clinical Course User Index [CF] Hinda Kehr, MD   Provided Keppra 1 g IV prior to transfer. The patient also did receive a dose of labetalol 10 mg IV as her blood pressure elevated prior to transfer.  She was still working on her 1st L of normal saline at the time of transfer. She did complete a dose of Levaquin 750 mg IV prior to transfer.  ____________________________________________  FINAL CLINICAL IMPRESSION(S) / ED DIAGNOSES  Final diagnoses:  Acute spontaneous subarachnoid intracranial hemorrhage (HCC)  Altered mental status, unspecified altered mental status type  Elevated lactic acid level     MEDICATIONS GIVEN DURING THIS VISIT:  Medications  sodium chloride 0.9 % bolus 1,000 mL (1,000 mLs Intravenous Transfusing/Transfer 08/20/2016 0008)  levofloxacin (LEVAQUIN) IVPB 750 mg (750 mg Intravenous Transfusing/Transfer 08/19/2016 0008)  morphine 4 MG/ML injection 4 mg (4 mg Intravenous Given 08/31/16 2247)  ondansetron (ZOFRAN) injection 4 mg (4 mg Intravenous Given 08/31/16 2247)  labetalol (NORMODYNE,TRANDATE) injection 10 mg (10 mg Intravenous Given 08/31/16 2359)  levETIRAcetam (KEPPRA) 1,000 mg in sodium chloride 0.9 % 100 mL IVPB (0 mg Intravenous Stopped 08/20/2016 0000)     NEW OUTPATIENT MEDICATIONS STARTED DURING THIS VISIT:  Discharge Medication List as of 09/02/2016 12:30 AM      Discharge Medication List as of 08/19/2016 12:30 AM      Discharge Medication List as of 08/22/2016 12:30 AM       Note:  This document was prepared using Dragon voice recognition software and may include unintentional dictation errors.    Hinda Kehr, MD 09/02/[id number]

## 2016-08-31 NOTE — ED Notes (Signed)
Patient transported to CT 

## 2016-08-31 NOTE — ED Notes (Signed)
Patient only intermittently follows commands. MD Karma Greaser notified. Pt will intermittently track you with eyes, squeeze hand. Patient responded verbally twice - once to MD, once to RN. Has not responded verbally since.

## 2016-09-01 ENCOUNTER — Encounter (HOSPITAL_COMMUNITY): Payer: Self-pay

## 2016-09-01 ENCOUNTER — Inpatient Hospital Stay (HOSPITAL_COMMUNITY)
Admission: EM | Admit: 2016-09-01 | Discharge: 2016-10-15 | DRG: 023 | Disposition: E | Payer: Medicare Other | Attending: Neurology | Admitting: Neurology

## 2016-09-01 ENCOUNTER — Inpatient Hospital Stay (HOSPITAL_COMMUNITY): Payer: Medicare Other

## 2016-09-01 DIAGNOSIS — Z9882 Breast implant status: Secondary | ICD-10-CM

## 2016-09-01 DIAGNOSIS — G0490 Encephalitis and encephalomyelitis, unspecified: Secondary | ICD-10-CM | POA: Diagnosis not present

## 2016-09-01 DIAGNOSIS — F17210 Nicotine dependence, cigarettes, uncomplicated: Secondary | ICD-10-CM | POA: Diagnosis present

## 2016-09-01 DIAGNOSIS — Z66 Do not resuscitate: Secondary | ICD-10-CM | POA: Diagnosis not present

## 2016-09-01 DIAGNOSIS — E871 Hypo-osmolality and hyponatremia: Secondary | ICD-10-CM | POA: Diagnosis not present

## 2016-09-01 DIAGNOSIS — I959 Hypotension, unspecified: Secondary | ICD-10-CM | POA: Diagnosis present

## 2016-09-01 DIAGNOSIS — Z90710 Acquired absence of both cervix and uterus: Secondary | ICD-10-CM

## 2016-09-01 DIAGNOSIS — Z8544 Personal history of malignant neoplasm of other female genital organs: Secondary | ICD-10-CM

## 2016-09-01 DIAGNOSIS — R4182 Altered mental status, unspecified: Secondary | ICD-10-CM | POA: Diagnosis not present

## 2016-09-01 DIAGNOSIS — B9689 Other specified bacterial agents as the cause of diseases classified elsewhere: Secondary | ICD-10-CM | POA: Diagnosis present

## 2016-09-01 DIAGNOSIS — Z789 Other specified health status: Secondary | ICD-10-CM | POA: Diagnosis not present

## 2016-09-01 DIAGNOSIS — Z4682 Encounter for fitting and adjustment of non-vascular catheter: Secondary | ICD-10-CM | POA: Diagnosis not present

## 2016-09-01 DIAGNOSIS — I471 Supraventricular tachycardia: Secondary | ICD-10-CM | POA: Diagnosis not present

## 2016-09-01 DIAGNOSIS — G8190 Hemiplegia, unspecified affecting unspecified side: Secondary | ICD-10-CM | POA: Diagnosis not present

## 2016-09-01 DIAGNOSIS — Z982 Presence of cerebrospinal fluid drainage device: Secondary | ICD-10-CM | POA: Diagnosis not present

## 2016-09-01 DIAGNOSIS — E876 Hypokalemia: Secondary | ICD-10-CM | POA: Diagnosis not present

## 2016-09-01 DIAGNOSIS — I69051 Hemiplegia and hemiparesis following nontraumatic subarachnoid hemorrhage affecting right dominant side: Secondary | ICD-10-CM | POA: Diagnosis not present

## 2016-09-01 DIAGNOSIS — G039 Meningitis, unspecified: Secondary | ICD-10-CM | POA: Diagnosis not present

## 2016-09-01 DIAGNOSIS — G936 Cerebral edema: Secondary | ICD-10-CM | POA: Diagnosis present

## 2016-09-01 DIAGNOSIS — R0902 Hypoxemia: Secondary | ICD-10-CM | POA: Diagnosis not present

## 2016-09-01 DIAGNOSIS — J9600 Acute respiratory failure, unspecified whether with hypoxia or hypercapnia: Secondary | ICD-10-CM | POA: Diagnosis not present

## 2016-09-01 DIAGNOSIS — E870 Hyperosmolality and hypernatremia: Secondary | ICD-10-CM | POA: Diagnosis present

## 2016-09-01 DIAGNOSIS — D649 Anemia, unspecified: Secondary | ICD-10-CM | POA: Diagnosis not present

## 2016-09-01 DIAGNOSIS — J9601 Acute respiratory failure with hypoxia: Secondary | ICD-10-CM | POA: Diagnosis not present

## 2016-09-01 DIAGNOSIS — I609 Nontraumatic subarachnoid hemorrhage, unspecified: Secondary | ICD-10-CM | POA: Diagnosis not present

## 2016-09-01 DIAGNOSIS — Z9911 Dependence on respirator [ventilator] status: Secondary | ICD-10-CM | POA: Diagnosis not present

## 2016-09-01 DIAGNOSIS — Z8679 Personal history of other diseases of the circulatory system: Secondary | ICD-10-CM | POA: Diagnosis not present

## 2016-09-01 DIAGNOSIS — Z8249 Family history of ischemic heart disease and other diseases of the circulatory system: Secondary | ICD-10-CM

## 2016-09-01 DIAGNOSIS — I6200 Nontraumatic subdural hemorrhage, unspecified: Secondary | ICD-10-CM | POA: Diagnosis not present

## 2016-09-01 DIAGNOSIS — G009 Bacterial meningitis, unspecified: Secondary | ICD-10-CM | POA: Diagnosis not present

## 2016-09-01 DIAGNOSIS — A498 Other bacterial infections of unspecified site: Secondary | ICD-10-CM

## 2016-09-01 DIAGNOSIS — I607 Nontraumatic subarachnoid hemorrhage from unspecified intracranial artery: Secondary | ICD-10-CM | POA: Diagnosis not present

## 2016-09-01 DIAGNOSIS — Z96642 Presence of left artificial hip joint: Secondary | ICD-10-CM | POA: Diagnosis not present

## 2016-09-01 DIAGNOSIS — I10 Essential (primary) hypertension: Secondary | ICD-10-CM | POA: Diagnosis present

## 2016-09-01 DIAGNOSIS — Z8673 Personal history of transient ischemic attack (TIA), and cerebral infarction without residual deficits: Secondary | ICD-10-CM | POA: Diagnosis not present

## 2016-09-01 DIAGNOSIS — G060 Intracranial abscess and granuloma: Secondary | ICD-10-CM | POA: Diagnosis not present

## 2016-09-01 DIAGNOSIS — Z515 Encounter for palliative care: Secondary | ICD-10-CM | POA: Diagnosis not present

## 2016-09-01 DIAGNOSIS — R7881 Bacteremia: Secondary | ICD-10-CM

## 2016-09-01 DIAGNOSIS — R838 Other abnormal findings in cerebrospinal fluid: Secondary | ICD-10-CM | POA: Diagnosis not present

## 2016-09-01 DIAGNOSIS — R739 Hyperglycemia, unspecified: Secondary | ICD-10-CM | POA: Diagnosis not present

## 2016-09-01 DIAGNOSIS — B49 Unspecified mycosis: Secondary | ICD-10-CM

## 2016-09-01 DIAGNOSIS — G0481 Other encephalitis and encephalomyelitis: Secondary | ICD-10-CM | POA: Diagnosis present

## 2016-09-01 DIAGNOSIS — J449 Chronic obstructive pulmonary disease, unspecified: Secondary | ICD-10-CM | POA: Diagnosis not present

## 2016-09-01 DIAGNOSIS — N390 Urinary tract infection, site not specified: Secondary | ICD-10-CM | POA: Diagnosis not present

## 2016-09-01 DIAGNOSIS — Z7982 Long term (current) use of aspirin: Secondary | ICD-10-CM

## 2016-09-01 DIAGNOSIS — Z823 Family history of stroke: Secondary | ICD-10-CM

## 2016-09-01 DIAGNOSIS — E860 Dehydration: Secondary | ICD-10-CM | POA: Diagnosis present

## 2016-09-01 DIAGNOSIS — R0603 Acute respiratory distress: Secondary | ICD-10-CM | POA: Diagnosis not present

## 2016-09-01 DIAGNOSIS — A86 Unspecified viral encephalitis: Secondary | ICD-10-CM | POA: Diagnosis not present

## 2016-09-01 DIAGNOSIS — E039 Hypothyroidism, unspecified: Secondary | ICD-10-CM | POA: Diagnosis not present

## 2016-09-01 DIAGNOSIS — J9690 Respiratory failure, unspecified, unspecified whether with hypoxia or hypercapnia: Secondary | ICD-10-CM

## 2016-09-01 DIAGNOSIS — F17200 Nicotine dependence, unspecified, uncomplicated: Secondary | ICD-10-CM | POA: Diagnosis not present

## 2016-09-01 DIAGNOSIS — Z9889 Other specified postprocedural states: Secondary | ICD-10-CM

## 2016-09-01 DIAGNOSIS — A499 Bacterial infection, unspecified: Secondary | ICD-10-CM | POA: Diagnosis not present

## 2016-09-01 DIAGNOSIS — Z978 Presence of other specified devices: Secondary | ICD-10-CM

## 2016-09-01 DIAGNOSIS — G008 Other bacterial meningitis: Secondary | ICD-10-CM | POA: Diagnosis not present

## 2016-09-01 DIAGNOSIS — Z79899 Other long term (current) drug therapy: Secondary | ICD-10-CM

## 2016-09-01 DIAGNOSIS — I6789 Other cerebrovascular disease: Secondary | ICD-10-CM

## 2016-09-01 DIAGNOSIS — G9689 Other specified disorders of central nervous system: Secondary | ICD-10-CM

## 2016-09-01 DIAGNOSIS — I69020 Aphasia following nontraumatic subarachnoid hemorrhage: Secondary | ICD-10-CM

## 2016-09-01 DIAGNOSIS — Z4659 Encounter for fitting and adjustment of other gastrointestinal appliance and device: Secondary | ICD-10-CM

## 2016-09-01 DIAGNOSIS — E785 Hyperlipidemia, unspecified: Secondary | ICD-10-CM | POA: Diagnosis present

## 2016-09-01 DIAGNOSIS — G968 Other specified disorders of central nervous system: Secondary | ICD-10-CM | POA: Diagnosis not present

## 2016-09-01 LAB — MRSA PCR SCREENING: MRSA BY PCR: NEGATIVE

## 2016-09-01 LAB — BLOOD CULTURE ID PANEL (REFLEXED)
ACINETOBACTER BAUMANNII: NOT DETECTED
CANDIDA ALBICANS: NOT DETECTED
CANDIDA PARAPSILOSIS: NOT DETECTED
CANDIDA TROPICALIS: NOT DETECTED
CARBAPENEM RESISTANCE: NOT DETECTED
Candida glabrata: NOT DETECTED
Candida krusei: NOT DETECTED
ENTEROBACTER CLOACAE COMPLEX: NOT DETECTED
Enterobacteriaceae species: DETECTED — AB
Enterococcus species: NOT DETECTED
Escherichia coli: NOT DETECTED
HAEMOPHILUS INFLUENZAE: NOT DETECTED
KLEBSIELLA PNEUMONIAE: NOT DETECTED
Klebsiella oxytoca: NOT DETECTED
Listeria monocytogenes: NOT DETECTED
NEISSERIA MENINGITIDIS: NOT DETECTED
PROTEUS SPECIES: NOT DETECTED
Pseudomonas aeruginosa: NOT DETECTED
SERRATIA MARCESCENS: DETECTED — AB
STAPHYLOCOCCUS AUREUS BCID: NOT DETECTED
STAPHYLOCOCCUS SPECIES: NOT DETECTED
STREPTOCOCCUS AGALACTIAE: NOT DETECTED
STREPTOCOCCUS SPECIES: NOT DETECTED
Streptococcus pneumoniae: NOT DETECTED
Streptococcus pyogenes: NOT DETECTED

## 2016-09-01 LAB — COMPREHENSIVE METABOLIC PANEL
ALK PHOS: 170 U/L — AB (ref 38–126)
ALT: 21 U/L (ref 14–54)
AST: 18 U/L (ref 15–41)
Albumin: 2.8 g/dL — ABNORMAL LOW (ref 3.5–5.0)
Anion gap: 13 (ref 5–15)
BUN: 11 mg/dL (ref 6–20)
CALCIUM: 9.3 mg/dL (ref 8.9–10.3)
CHLORIDE: 101 mmol/L (ref 101–111)
CO2: 24 mmol/L (ref 22–32)
CREATININE: 0.61 mg/dL (ref 0.44–1.00)
Glucose, Bld: 155 mg/dL — ABNORMAL HIGH (ref 65–99)
Potassium: 3 mmol/L — ABNORMAL LOW (ref 3.5–5.1)
Sodium: 138 mmol/L (ref 135–145)
Total Bilirubin: 0.8 mg/dL (ref 0.3–1.2)
Total Protein: 7.8 g/dL (ref 6.5–8.1)

## 2016-09-01 LAB — CBC
HEMATOCRIT: 40.8 % (ref 36.0–46.0)
HEMOGLOBIN: 14.4 g/dL (ref 12.0–15.0)
MCH: 32.9 pg (ref 26.0–34.0)
MCHC: 35.3 g/dL (ref 30.0–36.0)
MCV: 93.2 fL (ref 78.0–100.0)
Platelets: 383 10*3/uL (ref 150–400)
RBC: 4.38 MIL/uL (ref 3.87–5.11)
RDW: 12 % (ref 11.5–15.5)
WBC: 19.6 10*3/uL — AB (ref 4.0–10.5)

## 2016-09-01 LAB — RAPID URINE DRUG SCREEN, HOSP PERFORMED
Amphetamines: NOT DETECTED
Barbiturates: NOT DETECTED
Benzodiazepines: NOT DETECTED
COCAINE: NOT DETECTED
OPIATES: POSITIVE — AB
Tetrahydrocannabinol: NOT DETECTED

## 2016-09-01 LAB — PROTIME-INR
INR: 1.2
PROTHROMBIN TIME: 15.2 s (ref 11.4–15.2)

## 2016-09-01 LAB — URINALYSIS, ROUTINE W REFLEX MICROSCOPIC
Bilirubin Urine: NEGATIVE
Glucose, UA: NEGATIVE mg/dL
Ketones, ur: NEGATIVE mg/dL
NITRITE: NEGATIVE
PH: 6.5 (ref 5.0–8.0)
Protein, ur: NEGATIVE mg/dL
SPECIFIC GRAVITY, URINE: 1.011 (ref 1.005–1.030)

## 2016-09-01 LAB — POCT I-STAT 3, ART BLOOD GAS (G3+)
ACID-BASE EXCESS: 3 mmol/L — AB (ref 0.0–2.0)
BICARBONATE: 25.4 mmol/L (ref 20.0–28.0)
O2 SAT: 96 %
PO2 ART: 76 mmHg — AB (ref 83.0–108.0)
TCO2: 26 mmol/L (ref 0–100)
pCO2 arterial: 32.3 mmHg (ref 32.0–48.0)
pH, Arterial: 7.506 — ABNORMAL HIGH (ref 7.350–7.450)

## 2016-09-01 LAB — URINE MICROSCOPIC-ADD ON: RBC / HPF: NONE SEEN RBC/hpf (ref 0–5)

## 2016-09-01 LAB — GLUCOSE, CAPILLARY
GLUCOSE-CAPILLARY: 169 mg/dL — AB (ref 65–99)
GLUCOSE-CAPILLARY: 148 mg/dL — AB (ref 65–99)
GLUCOSE-CAPILLARY: 146 mg/dL — AB (ref 65–99)
Glucose-Capillary: 143 mg/dL — ABNORMAL HIGH (ref 65–99)
Glucose-Capillary: 149 mg/dL — ABNORMAL HIGH (ref 65–99)
Glucose-Capillary: 172 mg/dL — ABNORMAL HIGH (ref 65–99)

## 2016-09-01 LAB — LACTIC ACID, PLASMA: Lactic Acid, Venous: 1.2 mmol/L (ref 0.5–1.9)

## 2016-09-01 LAB — APTT: aPTT: 29 seconds (ref 24–36)

## 2016-09-01 MED ORDER — CLONAZEPAM 0.5 MG PO TABS
0.5000 mg | ORAL_TABLET | Freq: Three times a day (TID) | ORAL | Status: DC | PRN
  Administered 2016-09-03: 1 mg via NASOGASTRIC
  Filled 2016-09-01: qty 2

## 2016-09-01 MED ORDER — LABETALOL HCL 5 MG/ML IV SOLN
10.0000 mg | INTRAVENOUS | Status: DC | PRN
  Administered 2016-09-01 – 2016-09-08 (×7): 10 mg via INTRAVENOUS
  Filled 2016-09-01 (×5): qty 4

## 2016-09-01 MED ORDER — SODIUM CHLORIDE 0.9 % IV SOLN
INTRAVENOUS | Status: AC
  Administered 2016-09-01: 08:00:00 via INTRAVENOUS
  Administered 2016-09-02: 75 mL/h via INTRAVENOUS

## 2016-09-01 MED ORDER — CIPROFLOXACIN IN D5W 400 MG/200ML IV SOLN
400.0000 mg | Freq: Two times a day (BID) | INTRAVENOUS | Status: DC
  Administered 2016-09-01: 400 mg via INTRAVENOUS
  Filled 2016-09-01 (×2): qty 200

## 2016-09-01 MED ORDER — HALOPERIDOL LACTATE 5 MG/ML IJ SOLN
INTRAMUSCULAR | Status: AC
  Administered 2016-09-01: 2 mg
  Filled 2016-09-01: qty 1

## 2016-09-01 MED ORDER — LEVOTHYROXINE SODIUM 25 MCG PO TABS
25.0000 ug | ORAL_TABLET | Freq: Every day | ORAL | Status: DC
  Administered 2016-09-01 – 2016-09-02 (×2): 25 ug via ORAL
  Filled 2016-09-01 (×2): qty 1

## 2016-09-01 MED ORDER — POTASSIUM CHLORIDE 2 MEQ/ML IV SOLN
30.0000 meq | Freq: Once | INTRAVENOUS | Status: AC
  Administered 2016-09-01: 30 meq via INTRAVENOUS
  Filled 2016-09-01: qty 15

## 2016-09-01 MED ORDER — ACETAMINOPHEN 650 MG RE SUPP
650.0000 mg | RECTAL | Status: DC | PRN
  Filled 2016-09-01: qty 1

## 2016-09-01 MED ORDER — PANTOPRAZOLE SODIUM 40 MG IV SOLR
40.0000 mg | Freq: Every day | INTRAVENOUS | Status: DC
  Administered 2016-09-01 – 2016-09-05 (×5): 40 mg via INTRAVENOUS
  Filled 2016-09-01 (×5): qty 40

## 2016-09-01 MED ORDER — STROKE: EARLY STAGES OF RECOVERY BOOK
Freq: Once | Status: AC
  Administered 2016-09-01: 03:00:00
  Filled 2016-09-01: qty 1

## 2016-09-01 MED ORDER — VITAMIN B-12 100 MCG PO TABS
100.0000 ug | ORAL_TABLET | Freq: Every day | ORAL | Status: DC
  Administered 2016-09-02 – 2016-09-12 (×10): 100 ug via ORAL
  Filled 2016-09-01 (×12): qty 1

## 2016-09-01 MED ORDER — AMLODIPINE BESYLATE 10 MG PO TABS
10.0000 mg | ORAL_TABLET | Freq: Every day | ORAL | Status: DC
  Administered 2016-09-01 – 2016-09-02 (×2): 10 mg via ORAL
  Filled 2016-09-01 (×2): qty 1

## 2016-09-01 MED ORDER — HALOPERIDOL LACTATE 5 MG/ML IJ SOLN: 2.0000 mg | Freq: Once | INTRAMUSCULAR | Status: AC

## 2016-09-01 MED ORDER — VALPROATE SODIUM 500 MG/5ML IV SOLN
250.0000 mg | Freq: Three times a day (TID) | INTRAVENOUS | Status: DC
  Administered 2016-09-01 – 2016-09-02 (×4): 250 mg via INTRAVENOUS
  Filled 2016-09-01 (×6): qty 2.5

## 2016-09-01 MED ORDER — SENNOSIDES-DOCUSATE SODIUM 8.6-50 MG PO TABS
1.0000 | ORAL_TABLET | Freq: Two times a day (BID) | ORAL | Status: DC
  Administered 2016-09-01 – 2016-09-11 (×15): 1 via ORAL
  Filled 2016-09-01 (×16): qty 1

## 2016-09-01 MED ORDER — ONDANSETRON HCL 4 MG PO TABS: 4.0000 mg | ORAL_TABLET | Freq: Three times a day (TID) | ORAL | Status: DC | PRN

## 2016-09-01 MED ORDER — GADOBENATE DIMEGLUMINE 529 MG/ML IV SOLN
10.0000 mL | Freq: Once | INTRAVENOUS | Status: AC | PRN
  Administered 2016-09-01: 10 mL via INTRAVENOUS

## 2016-09-01 MED ORDER — INSULIN ASPART 100 UNIT/ML ~~LOC~~ SOLN
2.0000 [IU] | SUBCUTANEOUS | Status: DC
  Administered 2016-09-01: 4 [IU] via SUBCUTANEOUS
  Administered 2016-09-01 (×3): 2 [IU] via SUBCUTANEOUS
  Administered 2016-09-01: 4 [IU] via SUBCUTANEOUS
  Administered 2016-09-02 – 2016-09-03 (×7): 2 [IU] via SUBCUTANEOUS
  Administered 2016-09-03: 4 [IU] via SUBCUTANEOUS
  Administered 2016-09-04: 2 [IU] via SUBCUTANEOUS
  Administered 2016-09-04 (×5): 4 [IU] via SUBCUTANEOUS
  Administered 2016-09-05: 2 [IU] via SUBCUTANEOUS
  Administered 2016-09-05: 4 [IU] via SUBCUTANEOUS
  Administered 2016-09-05 – 2016-09-07 (×5): 2 [IU] via SUBCUTANEOUS
  Administered 2016-09-07: 4 [IU] via SUBCUTANEOUS
  Administered 2016-09-07 – 2016-09-09 (×9): 2 [IU] via SUBCUTANEOUS
  Administered 2016-09-10: 4 [IU] via SUBCUTANEOUS
  Administered 2016-09-10: 2 [IU] via SUBCUTANEOUS
  Administered 2016-09-10 (×3): 4 [IU] via SUBCUTANEOUS
  Administered 2016-09-10 – 2016-09-12 (×9): 2 [IU] via SUBCUTANEOUS

## 2016-09-01 MED ORDER — SODIUM CHLORIDE 0.9 % IV SOLN
2.0000 g | Freq: Two times a day (BID) | INTRAVENOUS | Status: DC
  Administered 2016-09-01 – 2016-09-02 (×2): 2 g via INTRAVENOUS
  Filled 2016-09-01 (×4): qty 2

## 2016-09-01 MED ORDER — LOSARTAN POTASSIUM 50 MG PO TABS
25.0000 mg | ORAL_TABLET | Freq: Every day | ORAL | Status: DC
  Administered 2016-09-01 – 2016-09-02 (×2): 25 mg via ORAL
  Filled 2016-09-01 (×2): qty 1

## 2016-09-01 MED ORDER — ACETAMINOPHEN 325 MG PO TABS
650.0000 mg | ORAL_TABLET | ORAL | Status: DC | PRN
  Administered 2016-09-04 – 2016-09-12 (×14): 650 mg via ORAL
  Filled 2016-09-01 (×14): qty 2

## 2016-09-01 MED ORDER — DILTIAZEM HCL 25 MG/5ML IV SOLN
INTRAVENOUS | Status: AC
  Filled 2016-09-01: qty 5

## 2016-09-01 MED ORDER — LABETALOL HCL 5 MG/ML IV SOLN
INTRAVENOUS | Status: AC
  Filled 2016-09-01: qty 4

## 2016-09-01 NOTE — Progress Notes (Signed)
STROKE TEAM PROGRESS NOTE   HISTORY OF PRESENT ILLNESS (per record) Sydney Coleman is an 65 y.o. female who was transferred from [facility] for further management of new subarachnoid hemorrhages.   Per provider notes at [facility]: "Sydney Coleman a 65 y.o.femalewith a complicated medical history that most recently includes hemorrhagic stroke approximately 17 days ago while she was in [place] resulting in craniotomy and clot evacuation. She presents tonight by EMS for evaluation of gradually worsening mental status since early this morning. She is currently at [facility] for rehabilitation, and her family reports that her current status is alert and oriented times three, conversant, and able to eat without difficulty. They report that yesterday the patient started complaining of nausea and vomiting. Reportedly she was starting to act unusual and was less responsive at 8 AM today including  Some intermittant left gaze preference. By tonight she was nonverbal and appeared acutely ill. She was transported by EMS for further evaluation. Her family was present at the bedside and stated that this was very far from her post procedural baseline."  "Her last known normal was last night with only nausea and vomiting reported as symptoms yesterday. She was ill appearing upon arrival with tachycardia, tachypnea, andaltered mental status, but afebrile and normotensive (in fact slightly hypertensive).""Pt began to have nausea/vomiting yesterday. Pt's eyes began to look left at approx 8am this morning. [name] reports when she got to [facility] at 1800 today patient no longer verbal."   Family states that symptoms began yesterday in the afternoon with "not feeling well" progressing to nausea and vomiting. She was taken to [facility] where CT head revealed two new subarachnoid hemorrhages, one involving sulci of the medial left frontal lobe and one involving sulci along the right  occipital lobe. Also noted on OSH CT was recent craniotomy defect with encephalomalacia in the adjacent left parietofrontal region, consistent with evacuation of recent prior lobar hemorrhage. Chronic small vessel ischemic changes were also noted.     SUBJECTIVE (INTERVAL HISTORY)  . The patient remains nonverbal. Exam is out of proportion to findings on imaging.No witnessed seizure activity.Family arrived after rounds. HPI confirmed from husband   OBJECTIVE Temp:  [98.2 F (36.8 C)-99.9 F (37.7 C)] 99.1 F (37.3 C) (11/18 0800) Pulse Rate:  [91-134] 100 (11/18 0715) Cardiac Rhythm: Normal sinus rhythm (11/18 0430) Resp:  [19-40] 23 (11/18 0715) BP: (116-172)/(82-104) 154/93 (11/18 0715) SpO2:  [95 %-100 %] 96 % (11/18 0715) Weight:  [58.7 kg (129 lb 6.6 oz)-61.1 kg (134 lb 11.2 oz)] 58.7 kg (129 lb 6.6 oz) (11/18 0400)  CBC:  Recent Labs Lab 08/31/16 2149 08/18/2016 0322  WBC 22.7* 19.6*  NEUTROABS 20.6*  --   HGB 15.7 14.4  HCT 45.3 40.8  MCV 94.1 93.2  PLT 471* [id number]    Basic Metabolic Panel:  Recent Labs Lab 08/31/16 2149 09/03/2016 0322  NA 132* 138  K 3.4* 3.0*  CL 94* 101  CO2 24 24  GLUCOSE 261* 155*  BUN 16 11  CREATININE 0.84 0.61  CALCIUM 9.7 9.3    Lipid Panel: No results found for: CHOL, TRIG, HDL, CHOLHDL, VLDL, LDLCALC HgbA1c: No results found for: HGBA1C Urine Drug Screen:    Component Value Date/Time   LABOPIA NONE DETECTED 05/11/2015 2037   COCAINSCRNUR NONE DETECTED 05/11/2015 2037   LABBENZ POSITIVE (A) 05/11/2015 2037   AMPHETMU NONE DETECTED 05/11/2015 2037   THCU NONE DETECTED 05/11/2015 2037   LABBARB POSITIVE (A) 05/11/2015 2037  IMAGING  Ct Head Wo Contrast 09/04/2016 1. Unchanged small amounts of superior parasagittal left convexity and right occipital subarachnoid blood.  2. No new areas of hemorrhage.  3. Unchanged appearance of left parietal packing material. No midline shift, new mass effect or hydrocephalus.  4.  Expected evolution of ischemia within the left hemisphere lower lobe predominantly involving the white matter and extending to the left frontal cortex.    Ct Head Wo Contrast 08/31/2016 1. Likely subarachnoid hemorrhage at the left convexity, and also at the right occipital lobe. Packing material at the left convexity, reflecting recent evacuation of a hematoma.  2. Evolving subacute infarct at the left cerebral hemisphere, primarily involving the white matter.  3. Chronic encephalomalacia at the white matter of the right frontal lobe, reflecting remote infarct. Scattered small vessel ischemic microangiopathy.  4. Mild partial opacification of the right mastoid air cells.     Dg Chest Port 1 View 08/31/2016 Peribronchial thickening noted.  Mild vascular congestion is seen.     PHYSICAL EXAM HEENT-  S/P left craniotomy. Neck  Some meningismus noted.  Lungs - Respirations unlabored.  Extremities - No edema. Warm and well-perfused.   Neurologic Examination: Ment: Drowsy. Unable to follow  Even 50% of simple motor commands. Nonverbal.  CN: PERRL. Decreased blink to threat on right. Left gaze preference. Will track visual stimuli to left and right with saccadic visual pursuits noted. Face symmetric. Decreased sensitivity to tactile stimulation on right. Head turns preferentially to left.  Motor. Flaccid RUE. 2/5 flexion response to noxious right foot and ankle. LUE and LLE 5/5.  Sensory. Reacts to noxious LUE and LLE. Decreased sensation RLE. No reaction to sensory stimuli RUE.  Reflexes: 1+ right biceps and brachioradialis, 2+ left biceps and brachioradialis, 1+ right patella, 3+ left patella, 1+ ankles. Right toe upgoing, left toe downgoing.  Cerebellar: No gross ataxia with LUE movements.  Gait: Unable to assess.     ASSESSMENT/PLAN Ms. Sydney Coleman is a 65 y.o. female with history of previous TIA, previous stroke, previous subarachnoid hemorrhage, hypertension, hypothyroidism,  hyperlipidemia, COPD, chronic headaches, and anxiety presenting with nausea, vomiting, left gaze preference, altered mental status, and no longer speaking.  She did not receive IV t-PA due to new subarachnoid hemorrhages.  Bilateral subarachnoid hemorrhages:  Bilateral SAH - cause unknown. S/p craniotomy for left pareital hemorrhage 4 weeks ago. 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  MRI  - pending  MRA - pending  MRV - pending  EEG - pending  Carotid Doppler - not indicated  2D Echo - not indicated  UDS - pending  U/A and culture - pending  TSH - pending  Blood cultures - pending  LDL - pending  HgbA1c -  pending  VTE prophylaxis - SCDs  Diet NPO time specified  aspirin 81 mg daily prior to admission, now on No antithrombotic  Ongoing aggressive stroke risk factor management  Therapy recommendations: pending  Disposition: Pending  Hypertension  Stable  Permissive hypertension (OK if < 220/120) but gradually normalize in 5-7 days  Long-term BP goal normotensive  Hyperlipidemia  Home meds:  Pravachol 20 mg daily prior to admission   Other Stroke Risk Factors  Advanced age  Cigarette smoker - will be advised to stop smoking.  Hx stroke/TIA  Family hx stroke (mother)   Other Active Problems  Hypokalemia - 3.4 -> 3.0 (supplemented)  Elevated glucose - check hemoglobin A1c  Leukocytosis -  22.7 -> 19.6 (low-grade fever - 99.9)  Hx of elevated serum homocysteine, C-reactive protein, and protein C activity in 2016  Left craniotomy site weeping with slight amount of pus per Dr Cheral Marker - consult NS  NPO   PLAN  EEG (may also need for prolonged EEG)   Start Depakote 250 mg IV Q 8 hours  U/A and culture - blood cultures  MRV - Pt had MRI / MRA today with and without contrast as ordered per Dr Cheral Marker. Per MRI tech will need to wait 48 hours before MRV can be performed due to  contrast load.  Obtain outside records ( status post craniotomy at hospital in [facility])  UDS  Possible cerebral angiogram Monday - await imaging results.    Hospital day # 0  Mikey Bussing PA-C [facility] Pager [phone] 09/07/2016, 12:03 PM I have personally examined this patient, reviewed notes, independently viewed imaging studies, participated in medical decision making and plan of care.ROS completed by me personally and pertinent positives fully documented  I have made any additions or clarifications directly to the above note. Agree with note above.Unusual recurrent convexity SAH of unknown cause. Cath angio negative in July 2016 but will need to be repeated next week. Check MRI and EEG. D/w husbamd, daughter and answered questions. This patient is critically ill and at significant risk of neurological worsening, death and care requires constant monitoring of vital signs, hemodynamics,respiratory and cardiac monitoring, extensive review of multiple databases, frequent neurological assessment, discussion with family, other specialists and medical decision making of high complexity.I have made any additions or clarifications directly to the above note.This critical care time does not reflect procedure time, or teaching time or supervisory time of PA/NP/Med Resident etc but could involve care discussion time.  I spent 40 minutes of neurocritical care time  in the care of  this patient.     Antony Contras, MD Medical Director [facility] Pager: [phone] 08/23/2016 12:36 PM   To contact Stroke Continuity provider, please refer to [email or web address] After hours, contact General Neurology

## 2016-09-01 NOTE — Progress Notes (Signed)
Request for medical records faxed to [facility] in [facility].

## 2016-09-01 NOTE — Progress Notes (Signed)
PHARMACY - PHYSICIAN COMMUNICATION CRITICAL VALUE ALERT - BLOOD CULTURE IDENTIFICATION (BCID)  Results for orders placed or performed during the hospital encounter of 08/31/16  Blood Culture ID Panel (Reflexed) (Collected: 08/31/2016 10:15 PM)  Result Value Ref Range   Enterococcus species NOT DETECTED NOT DETECTED   Listeria monocytogenes NOT DETECTED NOT DETECTED   Staphylococcus species NOT DETECTED NOT DETECTED   Staphylococcus aureus NOT DETECTED NOT DETECTED   Streptococcus species NOT DETECTED NOT DETECTED   Streptococcus agalactiae NOT DETECTED NOT DETECTED   Streptococcus pneumoniae NOT DETECTED NOT DETECTED   Streptococcus pyogenes NOT DETECTED NOT DETECTED   Acinetobacter baumannii NOT DETECTED NOT DETECTED   Enterobacteriaceae species DETECTED (A) NOT DETECTED   Enterobacter cloacae complex NOT DETECTED NOT DETECTED   Escherichia coli NOT DETECTED NOT DETECTED   Klebsiella oxytoca NOT DETECTED NOT DETECTED   Klebsiella pneumoniae NOT DETECTED NOT DETECTED   Proteus species NOT DETECTED NOT DETECTED   Serratia marcescens DETECTED (A) NOT DETECTED   Carbapenem resistance NOT DETECTED NOT DETECTED   Haemophilus influenzae NOT DETECTED NOT DETECTED   Neisseria meningitidis NOT DETECTED NOT DETECTED   Pseudomonas aeruginosa NOT DETECTED NOT DETECTED   Candida albicans NOT DETECTED NOT DETECTED   Candida glabrata NOT DETECTED NOT DETECTED   Candida krusei NOT DETECTED NOT DETECTED   Candida parapsilosis NOT DETECTED NOT DETECTED   Candida tropicalis NOT DETECTED NOT DETECTED    Name of physician (or Provider) Contacted: Paged Dr. Titus Mould   Changes to prescribed antibiotics required: Would initially recommend CTX per our BCID protocol, however, patient has PCN allergy. [facility] notified of BCID and will discuss BCID results and antibiotic options with Dr. Titus Mould.   Argie Ramming, PharmD Pharmacy Resident  Pager [phone] 09/08/2016 2:18 PM

## 2016-09-01 NOTE — ED Notes (Signed)
Report given to [place].  Bed confirmed to be room 12.  Belongings taken taken with the patient to the room. This RN will transport patient at this time. No acute distress noted

## 2016-09-01 NOTE — Progress Notes (Signed)
Routine EEG reviewed. No evidence of seizure, no epileptiform activity. Findings compatible with known hemorrhage with diffuse slowing, greater on the left. Continuous EEG deferred at this time.

## 2016-09-01 NOTE — ED Provider Notes (Signed)
[facility] DEPT Provider Note   CSN: [id number] Arrival date & time: 09/03/2016  0047  By signing my name below, I, Gean Quint, attest that this documentation has been prepared under the direction and in the presence of Varney Biles, MD. Electronically Signed: Gean Quint, ED Scribe. 08/17/2016. 1:51 AM.   History   Chief Complaint Chief Complaint  Patient presents with  . Headache    hemorrhagic stroke   LEVEL 5 CAVEAT DUE TO ACUITY OF MEDICAL CONDITION   The history is provided by the EMS personnel. No language interpreter was used.     HPI Comments: Sydney Coleman is a 64 y.o. female brought in by ambulance, who presents to the Emergency Department as a transfer from [facility] for admission to neuro ICU. Pt arrives with a diagnosis of acute and subacute SAH. Pt has a recent history of hemorrhagic stroke in [place]. Pt was recovering at home, but family noticed a change in mental status today. Pt also with nausea and vomiting yesterday. Pt received Levaquin and Keppra PTA per EMS. Code status: Full Code.   Past Medical History:  Diagnosis Date  . Acute spontaneous subarachnoid intracranial hemorrhage (HCC)   . Anxiety    better since pin removed;   . Arthritis   . Cancer of vulva ([place])    >15 year ago;   . Chronic headaches   . COPD (chronic obstructive pulmonary disease) ([place])   . Difficult intubation    two (2) teeth broken during intubation  . Elevated lipids   . Hypertension    controlled with medication;   . Hypothyroidism   . PONV (postoperative nausea and vomiting)   . SAH (subarachnoid hemorrhage) ([place]) 05/11/2015  . Stroke [facility])    TIA  . TIA (transient ischemic attack) 2008    Patient Active Problem List   Diagnosis Date Noted  . Subarachnoid (nontraumatic) hemorrhage of newborn 09/10/2016  . Subarachnoid bleed ([place]) 09/10/2016  . Patella fracture 08/10/2015  . SAH (subarachnoid hemorrhage) ([place]) 05/12/2015    Past Surgical History:    Procedure Laterality Date  . ABDOMINAL HYSTERECTOMY    . BREAST SURGERY    . EYE SURGERY Right    Cataract Extraction  . HARDWARE REMOVAL Right 10/27/2015   Procedure: HARDWARE REMOVAL;  Surgeon: Thornton Park, MD;  Location: [facility] ORS;  Service: Orthopedics;  Laterality: Right;  . HARDWARE REMOVAL Right 05/16/2016   Procedure: HARDWARE REMOVAL;  Surgeon: Thornton Park, MD;  Location: [facility] ORS;  Service: Orthopedics;  Laterality: Right;  . JOINT REPLACEMENT Left    Partial Hip Replacement  . ORIF PATELLA Right 08/10/2015   Procedure: OPEN REDUCTION INTERNAL (ORIF) FIXATION PATELLA TENSION BAND WIRING;  Surgeon: Thornton Park, MD;  Location: [facility] ORS;  Service: Orthopedics;  Laterality: Right;  . PLACEMENT OF BREAST IMPLANTS    . THYROIDECTOMY, PARTIAL    . TONSILLECTOMY    . TOTAL HIP ARTHROPLASTY Left     OB History    No data available       Home Medications    Prior to Admission medications   Medication Sig Start Date End Date Taking? Authorizing Provider  amLODipine (NORVASC) 10 MG tablet Take 10 mg by mouth daily. 08/23/16  Yes Historical Provider, MD  clonazePAM (KLONOPIN) 0.5 MG tablet Take 0.5-1 mg by mouth daily as needed for anxiety.    Yes Historical Provider, MD  cyanocobalamin 100 MCG tablet Take 100 mcg by mouth daily. Reported on 11/10/2015   Yes Historical Provider, MD  FOLIC ACID PO Take [id number] mg by mouth daily.    Yes Historical Provider, MD  levothyroxine (SYNTHROID, LEVOTHROID) 25 MCG tablet Take 25 mcg by mouth daily before breakfast.   Yes Historical Provider, MD  losartan (COZAAR) 25 MG tablet Take 25 mg by mouth daily.    Yes Historical Provider, MD  pravastatin (PRAVACHOL) 20 MG tablet Take 20 mg by mouth daily.   Yes Historical Provider, MD  HYDROcodone-acetaminophen (NORCO) 5-325 MG tablet Take 1-2 tablets by mouth every 4 (four) hours as needed for moderate pain. MAXIMUM TOTAL ACETAMINOPHEN DOSE IS 4000 MG PER DAY Patient not taking: Reported on  08/31/2016 05/16/16   Thornton Park, MD  ondansetron (ZOFRAN) 4 MG tablet Take 1 tablet (4 mg total) by mouth every 8 (eight) hours as needed for nausea or vomiting. Patient not taking: Reported on 08/21/2016 05/16/16   Thornton Park, MD    Family History Family History  Problem Relation Age of Onset  . Congestive Heart Failure Father   . Stroke Mother   . Heart disease Mother     Social History Social History  Substance Use Topics  . Smoking status: Current Every Day Smoker    Packs/day: 1.00    Types: Cigarettes  . Smokeless tobacco: Never Used     Comment: Refuses to tell how much she smokes   . Alcohol use No     Allergies   Penicillins; Amoxil [amoxicillin]; Augmentin [amoxicillin-pot clavulanate]; Other; and Vancomycin   Review of Systems Review of Systems  Unable to perform ROS: Acuity of condition    Physical Exam Updated Vital Signs BP (!) 142/83   Pulse (!) 112   Temp 97.8 F (36.6 C) (Axillary)   Resp (!) 37   Ht 5\' 3"  (1.6 m)   Wt 129 lb 6.6 oz (58.7 kg)   SpO2 100%   BMI 22.92 kg/m   Physical Exam  Constitutional: She appears well-developed and well-nourished. No distress.  HENT:  Head: Normocephalic and atraumatic.  Slight nasolabial flattening on the left side of the face  Eyes: Conjunctivae are normal.  69mm and equal, up going gaze of the eyes  Neck: Neck supple.  Cardiovascular: Normal rate and regular rhythm.   No murmur heard. Pulmonary/Chest: Effort normal and breath sounds normal. No respiratory distress.  Abdominal: Soft. There is no tenderness.  Musculoskeletal: She exhibits no edema.  Pt is noted to be moving lower extremities bilaterally. Pt is moving left side better than the right. RUE appears paralyzed.  Neurological: She is alert.  Alert and following simple commands  Skin: Skin is warm and dry.  Psychiatric: She has a normal mood and affect.  Nursing note and vitals reviewed.    ED Treatments / Results  DIAGNOSTIC  STUDIES: Oxygen Saturation is 98% on RA, normal by my interpretation.    Labs (all labs ordered are listed, but only abnormal results are displayed) Labs Reviewed  CBC - Abnormal; Notable for the following:       Result Value   WBC 19.6 (*)    All other components within normal limits  COMPREHENSIVE METABOLIC PANEL - Abnormal; Notable for the following:    Potassium 3.0 (*)    Glucose, Bld 155 (*)    Albumin 2.8 (*)    Alkaline Phosphatase 170 (*)    All other components within normal limits  GLUCOSE, CAPILLARY - Abnormal; Notable for the following:    Glucose-Capillary 169 (*)    All other components within normal limits  RAPID URINE DRUG SCREEN, HOSP PERFORMED - Abnormal; Notable for the following:    Opiates POSITIVE (*)    All other components within normal limits  GLUCOSE, CAPILLARY - Abnormal; Notable for the following:    Glucose-Capillary 172 (*)    All other components within normal limits  URINALYSIS, ROUTINE W REFLEX MICROSCOPIC (NOT AT [facility]) - Abnormal; Notable for the following:    APPearance CLOUDY (*)    Hgb urine dipstick SMALL (*)    Leukocytes, UA SMALL (*)    All other components within normal limits  URINE MICROSCOPIC-ADD ON - Abnormal; Notable for the following:    Squamous Epithelial / LPF 0-5 (*)    Bacteria, UA FEW (*)    All other components within normal limits  GLUCOSE, CAPILLARY - Abnormal; Notable for the following:    Glucose-Capillary 148 (*)    All other components within normal limits  GLUCOSE, CAPILLARY - Abnormal; Notable for the following:    Glucose-Capillary 143 (*)    All other components within normal limits  GLUCOSE, CAPILLARY - Abnormal; Notable for the following:    Glucose-Capillary 146 (*)    All other components within normal limits  POCT I-STAT 3, ART BLOOD GAS (G3+) - Abnormal; Notable for the following:    pH, Arterial 7.506 (*)    pO2, Arterial 76.0 (*)    Acid-Base Excess 3.0 (*)    All other components within normal  limits  MRSA PCR SCREENING  URINE CULTURE  PROTIME-INR  APTT  LACTIC ACID, PLASMA  CBC  BASIC METABOLIC PANEL  TSH  HEMOGLOBIN A1C  LIPID PANEL    EKG  EKG Interpretation None       Radiology Ct Head Wo Contrast  Result Date: 08/27/2016 CLINICAL DATA:  Subarachnoid hemorrhage and altered mental status EXAM: CT HEAD WITHOUT CONTRAST TECHNIQUE: Contiguous axial images were obtained from the base of the skull through the vertex without intravenous contrast. COMPARISON:  Head CT 08/31/2016 FINDINGS: Brain: Small amounts of superior left convexity subarachnoid hemorrhage is unchanged. Small area of subarachnoid blood products at the right occipital lobe are also unchanged. There are no new areas of hemorrhage identified. There is periventricular hypoattenuation compatible with chronic microvascular disease. Superior left parietal packing material is unchanged. Extensive hypoattenuation within the left hemispheric white matter extending to the left frontal cortex is unchanged and likely indicates expected evolution of ischemia. There is no midline shift or significant mass effect. No hydrocephalus. Vascular: No hyperdense vessel or unexpected calcification. Skull: Superior left parietal craniotomy. Sinuses/Orbits: Visualized paranasal sinuses are clear. Trace right mastoid fluid. Bilateral lens replacements. Other: None IMPRESSION: 1. Unchanged small amounts of superior parasagittal left convexity and right occipital subarachnoid blood. 2. No new areas of hemorrhage. 3. Unchanged appearance of left parietal packing material. No midline shift, new mass effect or hydrocephalus. 4. Expected evolution of ischemia within the left hemisphere lower lobe predominantly involving the white matter and extending to the left frontal cortex. Electronically Signed   By: Ulyses Jarred M.D.   On: 08/25/2016 04:03   Ct Head Wo Contrast  Result Date: 08/31/2016 CLINICAL DATA:  Acute onset of nausea and vomiting.  Left-sided gaze. Recent CVA, with craniotomy. Initial encounter. EXAM: CT HEAD WITHOUT CONTRAST TECHNIQUE: Contiguous axial images were obtained from the base of the skull through the vertex without intravenous contrast. COMPARISON:  CT of the head performed 01/02/2016 FINDINGS: Brain: Likely subarachnoid hemorrhage is noted at the left convexity, and also at the right occipital lobe. There  is packing material at the left convexity, reflecting recent evacuation of a hematoma. Evolving subacute infarct is noted at the left cerebral hemisphere, primarily involving the white matter. Chronic encephalomalacia is noted at the white matter of the right frontal lobe, reflecting remote infarct. Scattered periventricular white matter change likely reflects small vessel ischemic microangiopathy. The brainstem and fourth ventricle are within normal limits. The basal ganglia are unremarkable in appearance. No midline shift is seen. Vascular: No hyperdense vessel or unexpected calcification. Skull: There is no evidence of fracture; the patient's craniotomy at the left vertex is unremarkable in appearance. Sinuses/Orbits: The visualized portions of the orbits are within normal limits. There is mild partial opacification of the right mastoid air cells. The visualized paranasal sinuses and left mastoid air cells are well-aerated. Other: No significant soft tissue abnormalities are seen. IMPRESSION: 1. Likely subarachnoid hemorrhage at the left convexity, and also at the right occipital lobe. Packing material at the left convexity, reflecting recent evacuation of a hematoma. 2. Evolving subacute infarct at the left cerebral hemisphere, primarily involving the white matter. 3. Chronic encephalomalacia at the white matter of the right frontal lobe, reflecting remote infarct. Scattered small vessel ischemic microangiopathy. 4. Mild partial opacification of the right mastoid air cells. These results were called by telephone at the time  of interpretation on 08/31/2016 at 10:24 pm to Dr. Hinda Kehr, who verbally acknowledged these results. Electronically Signed   By: Garald Balding M.D.   On: 08/31/2016 22:28   Mr [name] [id number] Contrast  Result Date: 09/02/2016 CLINICAL DATA:  Subarachnoid hemorrhage. History of parenchymal hemorrhage 08/15/2016 in [place] requiring evacuation. Worsening mental status starting 08/31/2016. EXAM: MRI HEAD WITHOUT AND WITH CONTRAST TECHNIQUE: Multiplanar, multiecho pulse sequences of the brain and surrounding structures were obtained without and with intravenous contrast. CONTRAST:  52mL MULTIHANCE GADOBENATE DIMEGLUMINE 529 MG/ML IV SOLN COMPARISON:  Head CT from earlier today and yesterday. Brain MRI 05/12/2015 FINDINGS: Brain: Rim enhancing collection in the parasagittal posterior left cerebral hemisphere measuring up to 7 cm in length. This has internal hypo intense packing material, hemosiderin rim, and T1 hyperintense components consistent with chronic blood products. Internal restricted diffusion is attributed to susceptibility artifact given the other signs of hemorrhage. Similar findings are seen in a smaller cystic area in the anterior left frontal lobe measuring 17 mm, which also has hemosiderin rim, diffusion hyperintensity, and T1 hyperintensity attributed to chronic blood products. No suspected abscess. Material with similar imaging characteristics is also seen casting the dependent lateral ventricles and in the bilateral infratentorial cisterns, greatest along the left CP angle and pre pontine cistern. Given the constellation of findings is is likely subacute hemorrhage. Infectious debris is considered unlikely in the absence ependymal or leptomeningeal enhancement. Isodense clot is stable compared to the recent CTs. Infratentorial clot was difficult to see on the previous CTs but likely stable in retrospect. Probable infarct in the left splenium of the corpus callosum, neighboring the  hematoma. There is a punctate infarct in the left cerebellum. Lateral ventriculomegaly that is new compared to 2016 brain MRI. Periventricular FLAIR hyperintensity could be a degree of CSF re- absorption, but was also seen previously and is primarily attributed to chronic microvascular disease. Superficial siderosis in the bifrontal sulci better seen on previous MRI. These changes have progressed posteriorly. Vascular: Preserved Skull and upper cervical spine: Left parietal craniotomy. No acute finding. Sinuses/Orbits: Bilateral cataract resection.  Nasal enteric tube. IMPRESSION: 1. Subacute parenchymal hematoma in the left cerebral hemisphere, the largest  is along the left frontal parietal junction has edema, local mass effect, and measures up to 7 cm. 2. Subacute hemorrhage or less likely debris in infratentorial cisterns and lateral ventricles. Mild lateral ventricular hydrocephalus when compared to 2016 comparison. 3. Periventricular T2 hyperintensity is likely a combination of CSF re- absorption and the chronic microvascular disease that was seen in 2016. 4. Superficial siderosis around the cerebral convexities, progressed from 2016. Is there known bleeding diastasis or vascular lesion? Electronically Signed   By: Monte Fantasia M.D.   On: 08/23/2016 12:32   Dg Chest Port 1 View  Result Date: 08/31/2016 CLINICAL DATA:  Acute onset of sepsis. Recent CVA. Left-sided gaze. Nausea and vomiting. Initial encounter. EXAM: PORTABLE CHEST 1 VIEW COMPARISON:  Chest radiograph performed 08/09/2015 FINDINGS: The lungs are well-aerated. Peribronchial thickening is noted. Mild vascular congestion is seen. There is no evidence of pleural effusion or pneumothorax. The cardiomediastinal silhouette is within normal limits. No acute osseous abnormalities are seen. IMPRESSION: Peribronchial thickening noted.  Mild vascular congestion is seen. Electronically Signed   By: Garald Balding M.D.   On: 08/31/2016 22:29   Dg Abd  Portable 1v  Result Date: 09/05/2016 CLINICAL DATA:  Feeding tube placement EXAM: PORTABLE ABDOMEN - 1 VIEW COMPARISON:  None. FINDINGS: Feeding tube is in place with the tip at the ligament of Treitz. Nonobstructive bowel gas pattern. No free air organomegaly. IMPRESSION: Feeding tube tip at the ligament of Treitz. Electronically Signed   By: Rolm Baptise M.D.   On: 09/10/2016 14:24    Procedures .Critical Care Performed by: Varney Biles Authorized by: Varney Biles   Critical care provider statement:    Critical care time (minutes):  31   Critical care time was exclusive of:  Separately billable procedures and treating other patients   Critical care was necessary to treat or prevent imminent or life-threatening deterioration of the following conditions:  CNS failure or compromise   Critical care was time spent personally by me on the following activities:  Blood draw for specimens, discussions with consultants, evaluation of patient's response to treatment, examination of patient, obtaining history from patient or surrogate, ordering and performing treatments and interventions, ordering and review of laboratory studies, ordering and review of radiographic studies and re-evaluation of patient's condition   (including critical care time)  Medications Ordered in ED Medications  acetaminophen (TYLENOL) tablet 650 mg (not administered)    Or  acetaminophen (TYLENOL) suppository 650 mg (not administered)  senna-docusate (Senokot-S) tablet 1 tablet (0 tablets Oral Hold 08/24/2016 2200)  pantoprazole (PROTONIX) injection 40 mg (40 mg Intravenous Given 08/23/2016 2142)  0.9 %  sodium chloride infusion ( Intravenous Restarted 08/16/2016 1415)  ondansetron (ZOFRAN) tablet 4 mg (not administered)  labetalol (NORMODYNE,TRANDATE) injection 10 mg (10 mg Intravenous Given 09/04/2016 0516)  labetalol (NORMODYNE,TRANDATE) 5 MG/ML injection (not administered)  amLODipine (NORVASC) tablet 10 mg (10 mg Oral  Given 08/23/2016 1533)  clonazePAM (KLONOPIN) tablet 0.5-1 mg (not administered)  vitamin B-12 (CYANOCOBALAMIN) tablet 100 mcg (100 mcg Oral Not Given 09/07/2016 1000)  levothyroxine (SYNTHROID, LEVOTHROID) tablet 25 mcg (25 mcg Oral Given 08/31/2016 1531)  losartan (COZAAR) tablet 25 mg (25 mg Oral Given 08/28/2016 1532)  insulin aspart (novoLOG) injection 2-6 Units (2 Units Subcutaneous Given 08/16/2016 2143)  valproate (DEPACON) 250 mg in dextrose 5 % 50 mL IVPB (250 mg Intravenous Given 08/31/2016 2144)  meropenem (MERREM) 2 g in sodium chloride 0.9 % 100 mL IVPB (2 g Intravenous Given 09/03/2016 1532)   stroke:  mapping our early stages of recovery book ( Does not apply Given 08/30/2016 0300)  potassium chloride 30 mEq in sodium chloride 0.9 % 265 mL (KCL MULTIRUN) IVPB (30 mEq Intravenous Given 08/18/2016 0851)  haloperidol lactate (HALDOL) injection 2 mg (0 mg Intravenous Duplicate [id number])  haloperidol lactate (HALDOL) 5 MG/ML injection (2 mg  Given 09/09/2016 1100)  gadobenate dimeglumine (MULTIHANCE) injection 10 mL (10 mLs Intravenous Contrast Given 09/12/2016 1152)     Initial Impression / Assessment and Plan / ED Course  I have reviewed the triage vital signs and the nursing notes.  Pertinent labs & imaging results that were available during my care of the patient were reviewed by me and considered in my medical decision making (see chart for details).  Clinical Course    Pt comes in with cc of AMS. Recent hemorrhagic stroke, and was recovering, but noted with acute change in mentation. Went to OSH where the detected 2 new acute SAH. Pt is noted to have R sided weakness > L side. Also has an upgoing gaze. She is alert. It appears that she has profound aphasia. She did follow simple command of squeezing my hand, but even that is inconsistent. Neurology on board. Pt had received keppra load and labetalol at the OSH. Neuro to admit to ICU. Fill code.   Final Clinical Impressions(s) / ED  Diagnoses   Final diagnoses:  SAH (subarachnoid hemorrhage) ([place])    New Prescriptions Current Discharge Medication List     I personally performed the services described in this documentation, which was scribed in my presence. The recorded information has been reviewed and is accurate.     Varney Biles, MD 09/04/2016 [phone]

## 2016-09-01 NOTE — Progress Notes (Signed)
PHARMACY - PHYSICIAN COMMUNICATION CRITICAL VALUE ALERT - BLOOD CULTURE IDENTIFICATION (BCID)  Results for orders placed or performed during the hospital encounter of 08/31/16  Blood Culture ID Panel (Reflexed) (Collected: 08/31/2016 10:15 PM)  Result Value Ref Range   Enterococcus species NOT DETECTED NOT DETECTED   Listeria monocytogenes NOT DETECTED NOT DETECTED   Staphylococcus species NOT DETECTED NOT DETECTED   Staphylococcus aureus NOT DETECTED NOT DETECTED   Streptococcus species NOT DETECTED NOT DETECTED   Streptococcus agalactiae NOT DETECTED NOT DETECTED   Streptococcus pneumoniae NOT DETECTED NOT DETECTED   Streptococcus pyogenes NOT DETECTED NOT DETECTED   Acinetobacter baumannii NOT DETECTED NOT DETECTED   Enterobacteriaceae species DETECTED (A) NOT DETECTED   Enterobacter cloacae complex NOT DETECTED NOT DETECTED   Escherichia coli NOT DETECTED NOT DETECTED   Klebsiella oxytoca NOT DETECTED NOT DETECTED   Klebsiella pneumoniae NOT DETECTED NOT DETECTED   Proteus species NOT DETECTED NOT DETECTED   Serratia marcescens DETECTED (A) NOT DETECTED   Carbapenem resistance NOT DETECTED NOT DETECTED   Haemophilus influenzae NOT DETECTED NOT DETECTED   Neisseria meningitidis NOT DETECTED NOT DETECTED   Pseudomonas aeruginosa NOT DETECTED NOT DETECTED   Candida albicans NOT DETECTED NOT DETECTED   Candida glabrata NOT DETECTED NOT DETECTED   Candida krusei NOT DETECTED NOT DETECTED   Candida parapsilosis NOT DETECTED NOT DETECTED   Candida tropicalis NOT DETECTED NOT DETECTED    Name of physician (or Provider) Contacted: Pt has been transferred to [facility]. Paged [facility] at [facility]. Also spoke with [facility] pharmacist, Joellen Jersey, and gave her the BCID information. Suggested ceftriaxone 2 g IV q 24 based on both [facility] and [facility] protocol for serratia marcescens.  Changes to prescribed antibiotics required: handed off to [facility], PharmD Pharmacy  Resident 08/18/2016 2:21 PM

## 2016-09-01 NOTE — Progress Notes (Signed)
Bedside EEG completed, results pending. 

## 2016-09-01 NOTE — Progress Notes (Signed)
SLP Cancellation Note  Patient Details Name: Sydney Coleman MRN: [id number] DOB: 02-14-1951   Cancelled treatment:       Reason Eval/Treat Not Completed: Patient at procedure or test/unavailable   Diala Waxman,PAT, M.S., CCC-SLP 09/12/2016, 8:52 AM

## 2016-09-01 NOTE — ED Notes (Signed)
Pt becoming increasingly agitated. MD Karma Greaser at bedside. MD Karma Greaser ordered 4 mg Morphine and 4 mg Zofran.

## 2016-09-01 NOTE — ED Notes (Signed)
PAGED NEUROLOGY TO NANA[facility]ATI @[phone] 

## 2016-09-01 NOTE — ED Triage Notes (Signed)
Patient comes from [facility] for a diagnosed headbleed.  Deficits noted on the right side.  History of recent stroke on 08/15/16.  Patient following simple commands.

## 2016-09-01 NOTE — Consult Note (Addendum)
Admission H&P    Chief Complaint: Recurrent intracranial hemorrhage - subarachnoid HPI: Sydney Coleman is an 65 y.o. female who was transferred from [facility] for further management of new subarachnoid hemorrhages.   Per provider notes at [place]: "Sydney Coleman is a 65 y.o. female with a complicated medical history that most recently includes hemorrhagic stroke  approximately 17 days ago  while she was in [place] resulting in craniotomy and clot evacuation. She presents tonight by EMS for evaluation of gradually worsening mental status since early this morning.  She is currently at [facility] for rehabilitation,, and her family reports that her  current status  is  alert and oriented times three,  conversant, and able to eat without difficulty. They report that  yesterday the patient started complaining of nausea and vomiting. Reportedly she was starting to  act unusual and be less responsive at 8 AM today  including  with some left gaze preference but not consistently so. By tonight she is nonverbal and appears acutely ill.  She was transported by EMS for further evaluation. Her family is present at bedside and states that this is very far from her post procedural baseline."  "Her last known normal was last night with only nausea and vomiting reported as symptoms yesterday. She is ill appearing upon arrival with tachycardia, tachypnea,  altered mental status, but afebrile  and normotensive (in fact slightly hypertensive).""Pt began to have nausea/vomiting yesterday. Pt's eyes began to look left at approx 8am this morning. [name] reports when she got to [facility] at 1800 today patient no longer verbal."   Family states that symptoms began yesterday in the afternoon with "not feeling well" progressing to nausea and vomiting. She was taken to [facility] where CT head revealed two new subarachnoid hemorrhages, one involving sulci of the medial left frontal lobe and one involving  sulci along the right occipital lobe. Also noted on OSH CT was recent craniotomy defect with encephalomalacia in the adjacent left parietofrontal region, consistent with evacuation of recent prior lobar hemorrhage. Chronic small vessel ischemic changes were also noted.    Past Medical History:  Diagnosis Date  . Acute spontaneous subarachnoid intracranial hemorrhage (HCC)   . Anxiety    better since pin removed;   . Arthritis   . Cancer of vulva ([place])    >15 year ago;   . Chronic headaches   . COPD (chronic obstructive pulmonary disease) ([place])   . Difficult intubation    two (2) teeth broken during intubation  . Elevated lipids   . Hypertension    controlled with medication;   . Hypothyroidism   . PONV (postoperative nausea and vomiting)   . SAH (subarachnoid hemorrhage) ([place]) 05/11/2015  . Stroke [facility])    TIA  . TIA (transient ischemic attack) 2008    Past Surgical History:  Procedure Laterality Date  . ABDOMINAL HYSTERECTOMY    . BREAST SURGERY    . EYE SURGERY Right    Cataract Extraction  . HARDWARE REMOVAL Right 10/27/2015   Procedure: HARDWARE REMOVAL;  Surgeon: Thornton Park, MD;  Location: [facility] ORS;  Service: Orthopedics;  Laterality: Right;  . HARDWARE REMOVAL Right 05/16/2016   Procedure: HARDWARE REMOVAL;  Surgeon: Thornton Park, MD;  Location: [facility] ORS;  Service: Orthopedics;  Laterality: Right;  . JOINT REPLACEMENT Left    Partial Hip Replacement  . ORIF PATELLA Right 08/10/2015   Procedure: OPEN REDUCTION INTERNAL (ORIF) FIXATION PATELLA TENSION BAND WIRING;  Surgeon: Thornton Park,  MD;  Location: [facility] ORS;  Service: Orthopedics;  Laterality: Right;  . PLACEMENT OF BREAST IMPLANTS    . THYROIDECTOMY, PARTIAL    . TONSILLECTOMY    . TOTAL HIP ARTHROPLASTY Left     Family History  Problem Relation Age of Onset  . Congestive Heart Failure Father   . Stroke Mother   . Heart disease Mother    Social History:  reports that she has been smoking  Cigarettes.  She has been smoking about 1.00 pack per day. She has never used smokeless tobacco. She reports that she does not drink alcohol or use drugs.  Allergies:  Allergies  Allergen Reactions  . Penicillins Shortness Of Breath    Has patient had a PCN reaction causing immediate rash, facial/tongue/throat swelling, SOB or lightheadedness with hypotension: Yes Has patient had a PCN reaction causing severe rash involving mucus membranes or skin necrosis: No Has patient had a PCN reaction that required hospitalization Yes Has patient had a PCN reaction occurring within the last 10 years: Yes If all of the above answers are "NO", then may proceed with Cephalosporin use.   Marland Kitchen Amoxil [Amoxicillin] Rash    Has patient had a PCN reaction causing immediate rash, facial/tongue/throat swelling, SOB or lightheadedness with hypotension: Yes Has patient had a PCN reaction causing severe rash involving mucus membranes or skin necrosis: Yes Has patient had a PCN reaction that required hospitalization No Has patient had a PCN reaction occurring within the last 10 years: Yes If all of the above answers are "NO", then may proceed with Cephalosporin use.   . Augmentin [Amoxicillin-Pot Clavulanate] Rash    Has patient had a PCN reaction causing immediate rash, facial/tongue/throat swelling, SOB or lightheadedness with hypotension: Yes Has patient had a PCN reaction causing severe rash involving mucus membranes or skin necrosis: Yes Has patient had a PCN reaction that required hospitalization No Has patient had a PCN reaction occurring within the last 10 years: Yes If all of the above answers are "NO", then may proceed with Cephalosporin use.   . Other Rash    Elastic  . Vancomycin Rash     (Not in a hospital admission)  ROS: Headache and neck pain per family. Some pain along left lateral aspect of chest. No abdominal pain or limb pain. No fevers.   Physical Examination: Blood pressure 157/89,  pulse 99, resp. rate 24, SpO2 98 %.  HEENT-  S/P left craniotomy. Neck  Some meningismus noted.  Lungs - Respirations unlabored.  Extremities - No edema. Warm and well-perfused.   Neurologic Examination: Ment: Drowsy. Able to follow 50% of simple motor commands. Nonverbal.  CN: PERRL. Decreased blink to threat on right. Left gaze preference. Will track visual stimuli to left and right with saccadic visual pursuits noted. Face symmetric. Decreased sensitivity to tactile stimulation on right. Head turns preferentially to left.  Motor. Flaccid RUE. 2/5 flexion response to noxious right foot and ankle. LUE and LLE 5/5.  Sensory. Reacts to noxious LUE and LLE. Decreased sensation RLE. No reaction to sensory stimuli RUE.  Reflexes: 1+ right biceps and brachioradialis, 2+ left biceps and brachioradialis, 1+ right patella, 3+ left patella, 1+ ankles. Right toe upgoing, left toe downgoing.  Cerebellar: No gross ataxia with LUE movements.  Gait: Unable to assess.   Results for orders placed or performed during the hospital encounter of 08/31/16 (from the past 48 hour(s))  Comprehensive metabolic panel     Status: Abnormal   Collection Time: 08/31/16  9:49  PM  Result Value Ref Range   Sodium 132 (L) 135 - 145 mmol/L   Potassium 3.4 (L) 3.5 - 5.1 mmol/L   Chloride 94 (L) 101 - 111 mmol/L   CO2 24 22 - 32 mmol/L   Glucose, Bld 261 (H) 65 - 99 mg/dL   BUN 16 6 - 20 mg/dL   Creatinine, Ser 0.84 0.44 - 1.00 mg/dL   Calcium 9.7 8.9 - 10.3 mg/dL   Total Protein 9.1 (H) 6.5 - 8.1 g/dL   Albumin 3.5 3.5 - 5.0 g/dL   AST 22 15 - 41 U/L   ALT 26 14 - 54 U/L   Alkaline Phosphatase 197 (H) 38 - 126 U/L   Total Bilirubin 0.7 0.3 - 1.2 mg/dL   GFR calc non Af Amer >60 >60 mL/min   GFR calc Af Amer >60 >60 mL/min    Comment: (NOTE) The eGFR has been calculated using the CKD EPI equation. This calculation has not been validated in all clinical situations. eGFR's persistently <60 mL/min signify possible  Chronic Kidney Disease.    Anion gap 14 5 - 15  Lipase, blood     Status: None   Collection Time: 08/31/16  9:49 PM  Result Value Ref Range   Lipase 18 11 - 51 U/L  Troponin I     Status: None   Collection Time: 08/31/16  9:49 PM  Result Value Ref Range   Troponin I <0.03 <0.03 ng/mL  CBC WITH DIFFERENTIAL     Status: Abnormal   Collection Time: 08/31/16  9:49 PM  Result Value Ref Range   WBC 22.7 (H) 3.6 - 11.0 K/uL   RBC 4.81 3.80 - 5.20 MIL/uL   Hemoglobin 15.7 12.0 - 16.0 g/dL   HCT 45.3 35.0 - 47.0 %   MCV 94.1 80.0 - 100.0 fL   MCH 32.7 26.0 - 34.0 pg   MCHC 34.8 32.0 - 36.0 g/dL   RDW 13.0 11.5 - 14.5 %   Platelets 471 (H) 150 - 440 K/uL   Neutrophils Relative % 90 %   Neutro Abs 20.6 (H) 1.4 - 6.5 K/uL   Lymphocytes Relative 3 %   Lymphs Abs 0.6 (L) 1.0 - 3.6 K/uL   Monocytes Relative 6 %   Monocytes Absolute 1.3 (H) 0.2 - 0.9 K/uL   Eosinophils Relative 0 %   Eosinophils Absolute 0.0 0 - 0.7 K/uL   Basophils Relative 1 %   Basophils Absolute 0.2 (H) 0 - 0.1 K/uL  APTT     Status: None   Collection Time: 08/31/16  9:49 PM  Result Value Ref Range   aPTT 29 24 - 36 seconds  Protime-INR     Status: None   Collection Time: 08/31/16  9:49 PM  Result Value Ref Range   Prothrombin Time 13.4 11.4 - 15.2 seconds   INR 1.02   Glucose, capillary     Status: Abnormal   Collection Time: 08/31/16  9:55 PM  Result Value Ref Range   Glucose-Capillary 241 (H) 65 - 99 mg/dL  Lactic acid, plasma     Status: Abnormal   Collection Time: 08/31/16 10:11 PM  Result Value Ref Range   Lactic Acid, Venous 2.5 (HH) 0.5 - 1.9 mmol/L    Comment: CRITICAL RESULT CALLED TO, READ BACK BY AND VERIFIED WITH JENNA ELLINGTON AT 2312 08/31/16.PMH  Urinalysis complete, with microscopic ([facility] only)     Status: Abnormal   Collection Time: 08/31/16 10:11 PM  Result Value Ref Range  Color, Urine YELLOW (A) YELLOW   APPearance TURBID (A) CLEAR   Glucose, UA >500 (A) NEGATIVE mg/dL   Bilirubin  Urine NEGATIVE NEGATIVE   Ketones, ur NEGATIVE NEGATIVE mg/dL   Specific Gravity, Urine 1.011 1.005 - 1.030   Hgb urine dipstick 2+ (A) NEGATIVE   pH 5.0 5.0 - 8.0   Protein, ur 100 (A) NEGATIVE mg/dL   Nitrite NEGATIVE NEGATIVE   Leukocytes, UA 2+ (A) NEGATIVE   RBC / HPF NONE SEEN 0 - 5 RBC/hpf   WBC, UA 6-30 0 - 5 WBC/hpf   Bacteria, UA FEW (A) NONE SEEN   Squamous Epithelial / LPF NONE SEEN NONE SEEN   Ct Head Wo Contrast  Result Date: 08/31/2016 CLINICAL DATA:  Acute onset of nausea and vomiting. Left-sided gaze. Recent CVA, with craniotomy. Initial encounter. EXAM: CT HEAD WITHOUT CONTRAST TECHNIQUE: Contiguous axial images were obtained from the base of the skull through the vertex without intravenous contrast. COMPARISON:  CT of the head performed 01/02/2016 FINDINGS: Brain: Likely subarachnoid hemorrhage is noted at the left convexity, and also at the right occipital lobe. There is packing material at the left convexity, reflecting recent evacuation of a hematoma. Evolving subacute infarct is noted at the left cerebral hemisphere, primarily involving the white matter. Chronic encephalomalacia is noted at the white matter of the right frontal lobe, reflecting remote infarct. Scattered periventricular white matter change likely reflects small vessel ischemic microangiopathy. The brainstem and fourth ventricle are within normal limits. The basal ganglia are unremarkable in appearance. No midline shift is seen. Vascular: No hyperdense vessel or unexpected calcification. Skull: There is no evidence of fracture; the patient's craniotomy at the left vertex is unremarkable in appearance. Sinuses/Orbits: The visualized portions of the orbits are within normal limits. There is mild partial opacification of the right mastoid air cells. The visualized paranasal sinuses and left mastoid air cells are well-aerated. Other: No significant soft tissue abnormalities are seen. IMPRESSION: 1. Likely  subarachnoid hemorrhage at the left convexity, and also at the right occipital lobe. Packing material at the left convexity, reflecting recent evacuation of a hematoma. 2. Evolving subacute infarct at the left cerebral hemisphere, primarily involving the white matter. 3. Chronic encephalomalacia at the white matter of the right frontal lobe, reflecting remote infarct. Scattered small vessel ischemic microangiopathy. 4. Mild partial opacification of the right mastoid air cells. These results were called by telephone at the time of interpretation on 08/31/2016 at 10:24 pm to Dr. Hinda Kehr, who verbally acknowledged these results. Electronically Signed   By: Garald Balding M.D.   On: 08/31/2016 22:28   Dg Chest Port 1 View  Result Date: 08/31/2016 CLINICAL DATA:  Acute onset of sepsis. Recent CVA. Left-sided gaze. Nausea and vomiting. Initial encounter. EXAM: PORTABLE CHEST 1 VIEW COMPARISON:  Chest radiograph performed 08/09/2015 FINDINGS: The lungs are well-aerated. Peribronchial thickening is noted. Mild vascular congestion is seen. There is no evidence of pleural effusion or pneumothorax. The cardiomediastinal silhouette is within normal limits. No acute osseous abnormalities are seen. IMPRESSION: Peribronchial thickening noted.  Mild vascular congestion is seen. Electronically Signed   By: Garald Balding M.D.   On: 08/31/2016 22:29    Assessment/Plan: 1. New subarachnoid hemorrhages x 2. Most likely time of onset was yesterday afternoon. DDx includes aneurysmal rupture and amyloid angiopathy. Discontinuing her outpatient ASA. Admitting to MICU. ICH order set initiated. Will obtain repeat CT to assess for interval stability by comparing with CT obtained at [facility].  2.  Recent lobar hemorrhage, left parietofrontal, on November 1. Status post craniotomy with evacuation in [facility]. Will need to get records.  3. HTN. BP management.  4. Hypothyroidism. Levothyroid.  5. Continue  folate and B12 supplementation.  6. Continue Klonopin to prevent withdrawal syndrome.  7. Discontinuing PRN outpatient opiate to limit sedation. Tylenol for pain.  8. COPD. Monitor pulse oximetry. Respiratory therapy consult.  9. PT/OT/Speech consults.  10 Leukocytosis. Most likely reactive. No fever, diaphoresis, flushing or other findings militating strongly in favor of infection. Meningismus is most likely secondary to subarachnoid blood.   45 minutes of time spent in the evaluation and management of this critically ill patient.   Electronically signed: Dr. Kerney Elbe 08/20/2016, 1:31 AM

## 2016-09-01 NOTE — ED Notes (Signed)
Performed in and out cath with Eustaquio Maize, NT

## 2016-09-01 NOTE — Procedures (Signed)
Electroencephalogram (EEG) Report  Date of study: 08/20/2016  Requesting clinician: Melba Coon, MD  Reason for study: Evaluate for seizure  Brief clinical history: [name] with recent L hemisphere ICH s/p resection now admitted with acute non-traumatic subarachnoid hemorrhage. She has had persistent altered mental status and EEG is now requested to evaluate for possible seizure.   Medications:  Current Facility-Administered Medications:  .  0.9 %  sodium chloride infusion, , Intravenous, Continuous, Kerney Elbe, MD, Last Rate: 75 mL/hr at 09/04/2016 1415 .  acetaminophen (TYLENOL) tablet 650 mg, 650 mg, Oral, Q4H PRN **OR** acetaminophen (TYLENOL) suppository 650 mg, 650 mg, Rectal, Q4H PRN, Kerney Elbe, MD .  amLODipine (NORVASC) tablet 10 mg, 10 mg, Oral, Daily, Kerney Elbe, MD, 10 mg at 08/31/2016 1533 .  clonazePAM (KLONOPIN) tablet 0.5-1 mg, 0.5-1 mg, Per NG tube, TID PRN, Kerney Elbe, MD .  insulin aspart (novoLOG) injection 2-6 Units, 2-6 Units, Subcutaneous, Q4H, Kerney Elbe, MD, 2 Units at 09/10/2016 1312 .  labetalol (NORMODYNE,TRANDATE) injection 10 mg, 10 mg, Intravenous, Q10 min PRN, Kerney Elbe, MD, 10 mg at 08/15/2016 0516 .  levothyroxine (SYNTHROID, LEVOTHROID) tablet 25 mcg, 25 mcg, Oral, QAC breakfast, Kerney Elbe, MD, 25 mcg at 08/25/2016 1531 .  losartan (COZAAR) tablet 25 mg, 25 mg, Oral, Daily, Kerney Elbe, MD, 25 mg at 08/23/2016 1532 .  meropenem (MERREM) 2 g in sodium chloride 0.9 % 100 mL IVPB, 2 g, Intravenous, Q12H, Raylene Miyamoto, MD, 2 g at 09/11/2016 1532 .  ondansetron (ZOFRAN) tablet 4 mg, 4 mg, Oral, Q8H PRN, Kerney Elbe, MD .  pantoprazole (PROTONIX) injection 40 mg, 40 mg, Intravenous, QHS, Kerney Elbe, MD .  senna-docusate (Senokot-S) tablet 1 tablet, 1 tablet, Oral, BID, Kerney Elbe, MD, 1 tablet at 09/06/2016 1533 .  valproate (DEPACON) 250 mg in dextrose 5 % 50 mL IVPB, 250 mg, Intravenous, Q8H, David L Rinehuls, PA-C, 250 mg at 08/21/2016  1424 .  vitamin B-12 (CYANOCOBALAMIN) tablet 100 mcg, 100 mcg, Oral, Daily, Kerney Elbe, MD  Description: This is a routine EEG performed using standard international 10-20 electrode placement. A total of 18 channels are recorded, including one for the EKG. The patient is awake throughout the recording.   Activating Maneuvers: None  Findings:  The EKG channel demonstrates a regular rhythm with a rate of about 100 beats per minute.   This recording demonstrates moderate to severe diffuse generalized slowing. This consists mainly of large amplitude polymorphic delta with some intermixed theta. The average background frequency is 4-5 Hz. Reactivity is limited.   There is a greater degree of focal slowing in the left posterior frontal, parietal,a nd central regions where frequencies average 2-4 Hz.  No epileptiform discharges are present. No seizures are recorded.      Impression: This is an abnormal EEG due to moderate to severe diffuse slowing. This is consistent with a global encephalopathic process, nonspecific as to etiology. The greater focal slowing in the posterior L hemisphere is consistent with known hemorrhage in that area.  There is no evidence of seizure or epileptiform activity to suggest propensity for seizure on this recording.    Melba Coon, MD [facility]

## 2016-09-01 NOTE — Progress Notes (Signed)
PT Cancellation Note  Patient Details Name: Sydney Coleman MRN: [id number] DOB: Dec 22, 1950   Cancelled Treatment:    Reason Eval/Treat Not Completed: Patient not medically ready Pt on bedrest. Will await increase in activity orders prior to initiation of PT eval. Will follow.   Marguarite Arbour A Reality Dejonge 08/18/2016, 6:27 AM Wray Kearns, PT, DPT [phone]

## 2016-09-01 NOTE — Consult Note (Signed)
PULMONARY / CRITICAL CARE MEDICINE   Name: Sydney Coleman MRN: [id number] DOB: 1951/06/29    ADMISSION DATE:  08/29/2016 CONSULTATION DATE:  08/22/2016  REFERRING MD:  Neuro /Dr. Cheral Marker   CHIEF COMPLAINT:  SAH   HISTORY OF PRESENT ILLNESS:   Pt admitted to Neuro ICU on 09/04/2016 for new subarachnoid hemorrhages.  She had a recent hemorrhagic stroke around 08/15/16 in [place] resulting in craniotomy and clot evacuation. She was brought in to ER for gradually worsening mental status that started morning of 08/31/16 . She was at [facility] for rehabilitation,, and her family reported that her baseline mental status was alert and oriented times three, conversant, and able to eat without difficulty. They reported that yesterday the patient started complaining of nausea and vomiting. Reportedly she was starting to act unusual and be less responsive at 8 AM on 08/31/16  including with some left gaze preference but not consistently so. Later in day on 08/31/16 she became nonverbal and acutely ill. She was transported by EMS for further evaluation.     She was taken to [facility] ER where CT head revealed two new subarachnoid hemorrhages, one involving sulci of the medial left frontal lobe and one involving sulci along the right occipital lobe. Also noted on OSH CT was recent craniotomy defect with encephalomalacia in the adjacent left parietofrontal region, consistent with evacuation of recent prior lobar hemorrhage. Chronic small vessel ischemic changes were also noted., evolving subacute infarct of left cerebral hemisphere . She was transferred to [facility] hospital to Neuro ICU . PCCM consulted . She remains confused not following commands. She is awake and moving left side w/ right sided weakness with non purposeful movements. O2 sats are good at 97% on room air. Does have tachypnea but appears comfortable w/ no increased wob.  She is going to MRI later today    PAST MEDICAL HISTORY :   She  has a past medical history of Acute spontaneous subarachnoid intracranial hemorrhage ([place]); Anxiety; Arthritis; Cancer of vulva ([place]); Chronic headaches; COPD (chronic obstructive pulmonary disease) ([place]); Difficult intubation; Elevated lipids; Hypertension; Hypothyroidism; PONV (postoperative nausea and vomiting); SAH (subarachnoid hemorrhage) ([place]) (05/11/2015); Stroke [facility]); and TIA (transient ischemic attack) (2008).  PAST SURGICAL HISTORY: She  has a past surgical history that includes Thyroidectomy, partial; Placement of breast implants; Abdominal hysterectomy; Tonsillectomy; Breast surgery; Eye surgery (Right); Joint replacement (Left); ORIF patella (Right, 08/10/2015); Hardware Removal (Right, 10/27/2015); Hardware Removal (Right, 05/16/2016); and Total hip arthroplasty (Left).  Allergies  Allergen Reactions  . Penicillins Shortness Of Breath    Has patient had a PCN reaction causing immediate rash, facial/tongue/throat swelling, SOB or lightheadedness with hypotension: Yes Has patient had a PCN reaction causing severe rash involving mucus membranes or skin necrosis: No Has patient had a PCN reaction that required hospitalization Yes Has patient had a PCN reaction occurring within the last 10 years: Yes If all of the above answers are "NO", then may proceed with Cephalosporin use.   Marland Kitchen Amoxil [Amoxicillin] Rash    Has patient had a PCN reaction causing immediate rash, facial/tongue/throat swelling, SOB or lightheadedness with hypotension: Yes Has patient had a PCN reaction causing severe rash involving mucus membranes or skin necrosis: Yes Has patient had a PCN reaction that required hospitalization No Has patient had a PCN reaction occurring within the last 10 years: Yes If all of the above answers are "NO", then may proceed with Cephalosporin use.   . Augmentin [Amoxicillin-Pot Clavulanate] Rash  Has patient had a PCN reaction causing immediate rash, facial/tongue/throat  swelling, SOB or lightheadedness with hypotension: Yes Has patient had a PCN reaction causing severe rash involving mucus membranes or skin necrosis: Yes Has patient had a PCN reaction that required hospitalization No Has patient had a PCN reaction occurring within the last 10 years: Yes If all of the above answers are "NO", then may proceed with Cephalosporin use.   . Other Rash    Elastic  . Vancomycin Rash    No current facility-administered medications on file prior to encounter.    Current Outpatient Prescriptions on File Prior to Encounter  Medication Sig  . clonazePAM (KLONOPIN) 0.5 MG tablet Take 0.5-1 mg by mouth daily as needed for anxiety.   . cyanocobalamin 100 MCG tablet Take 100 mcg by mouth daily. Reported on 11/10/2015  . FOLIC ACID PO Take [id number] mg by mouth daily.   Marland Kitchen levothyroxine (SYNTHROID, LEVOTHROID) 25 MCG tablet Take 25 mcg by mouth daily before breakfast.  . losartan (COZAAR) 25 MG tablet Take 25 mg by mouth daily.   . pravastatin (PRAVACHOL) 20 MG tablet Take 20 mg by mouth daily.  Marland Kitchen HYDROcodone-acetaminophen (NORCO) 5-325 MG tablet Take 1-2 tablets by mouth every 4 (four) hours as needed for moderate pain. MAXIMUM TOTAL ACETAMINOPHEN DOSE IS 4000 MG PER DAY (Patient not taking: Reported on 09/12/2016)  . ondansetron (ZOFRAN) 4 MG tablet Take 1 tablet (4 mg total) by mouth every 8 (eight) hours as needed for nausea or vomiting. (Patient not taking: Reported on 09/02/2016)    FAMILY HISTORY:  Her indicated that her mother is deceased. She indicated that her father is deceased.    SOCIAL HISTORY: She  reports that she has been smoking Cigarettes.  She has been smoking about 1.00 pack per day. She has never used smokeless tobacco. She reports that she does not drink alcohol or use drugs.  REVIEW OF SYSTEMS:   Unable to obtain as patient is confused.   SUBJECTIVE:  Mental status changes with new subarachnoid hemorhage   VITAL SIGNS: BP (!) 154/93   Pulse 100    Temp 99.9 F (37.7 C)   Resp (!) 23   Ht 5\' 3"  (1.6 m)   Wt 58.7 kg (129 lb 6.6 oz)   SpO2 96%   BMI 22.92 kg/m   HEMODYNAMICS:    VENTILATOR SETTINGS:    INTAKE / OUTPUT: I/O last 3 completed shifts: In: 225 [I.V.:225] Out: -   PHYSICAL EXAMINATION: General:  Awake and alert w/ tachypnea  Neuro:  Alert, moving left side ,  not following commands, nonverbal ,  HEENT:  Oral mucosa dry , PERRL , + left gaze  Cardiovascular:  RRR  Lungs:  Decreased BS in bases w/ no wheezing  Abdomen:  Soft , NT ,  Hypoactive BS  Musculoskeletal:  Moving left side , right sided weakness  Skin: scalp incision w/ crusting noted.   LABS:  BMET  Recent Labs Lab 08/31/16 2149 08/20/2016 0322  NA 132* 138  K 3.4* 3.0*  CL 94* 101  CO2 24 24  BUN 16 11  CREATININE 0.84 0.61  GLUCOSE 261* 155*    Electrolytes  Recent Labs Lab 08/31/16 2149 09/03/2016 0322  CALCIUM 9.7 9.3    CBC  Recent Labs Lab 08/31/16 2149 08/31/2016 0322  WBC 22.7* 19.6*  HGB 15.7 14.4  HCT 45.3 40.8  PLT 471* 383    Coag's  Recent Labs Lab 08/31/16 2149 08/31/2016 0322  APTT  29 29  INR 1.02 1.20    Sepsis Markers  Recent Labs Lab 08/31/16 2211  LATICACIDVEN 2.5*    ABG No results for input(s): PHART, PCO2ART, PO2ART in the last 168 hours.  Liver Enzymes  Recent Labs Lab 08/31/16 2149 08/23/2016 0322  AST 22 18  ALT 26 21  ALKPHOS 197* 170*  BILITOT 0.7 0.8  ALBUMIN 3.5 2.8*    Cardiac Enzymes  Recent Labs Lab 08/31/16 2149  TROPONINI <0.03    Glucose  Recent Labs Lab 08/31/16 2155 09/10/2016 0415  GLUCAP 241* 169*    Imaging Ct Head Wo Contrast  Result Date: 08/18/2016 CLINICAL DATA:  Subarachnoid hemorrhage and altered mental status EXAM: CT HEAD WITHOUT CONTRAST TECHNIQUE: Contiguous axial images were obtained from the base of the skull through the vertex without intravenous contrast. COMPARISON:  Head CT 08/31/2016 FINDINGS: Brain: Small amounts of superior  left convexity subarachnoid hemorrhage is unchanged. Small area of subarachnoid blood products at the right occipital lobe are also unchanged. There are no new areas of hemorrhage identified. There is periventricular hypoattenuation compatible with chronic microvascular disease. Superior left parietal packing material is unchanged. Extensive hypoattenuation within the left hemispheric white matter extending to the left frontal cortex is unchanged and likely indicates expected evolution of ischemia. There is no midline shift or significant mass effect. No hydrocephalus. Vascular: No hyperdense vessel or unexpected calcification. Skull: Superior left parietal craniotomy. Sinuses/Orbits: Visualized paranasal sinuses are clear. Trace right mastoid fluid. Bilateral lens replacements. Other: None IMPRESSION: 1. Unchanged small amounts of superior parasagittal left convexity and right occipital subarachnoid blood. 2. No new areas of hemorrhage. 3. Unchanged appearance of left parietal packing material. No midline shift, new mass effect or hydrocephalus. 4. Expected evolution of ischemia within the left hemisphere lower lobe predominantly involving the white matter and extending to the left frontal cortex. Electronically Signed   By: Ulyses Jarred M.D.   On: 08/15/2016 04:03   Ct Head Wo Contrast  Result Date: 08/31/2016 CLINICAL DATA:  Acute onset of nausea and vomiting. Left-sided gaze. Recent CVA, with craniotomy. Initial encounter. EXAM: CT HEAD WITHOUT CONTRAST TECHNIQUE: Contiguous axial images were obtained from the base of the skull through the vertex without intravenous contrast. COMPARISON:  CT of the head performed 01/02/2016 FINDINGS: Brain: Likely subarachnoid hemorrhage is noted at the left convexity, and also at the right occipital lobe. There is packing material at the left convexity, reflecting recent evacuation of a hematoma. Evolving subacute infarct is noted at the left cerebral hemisphere,  primarily involving the white matter. Chronic encephalomalacia is noted at the white matter of the right frontal lobe, reflecting remote infarct. Scattered periventricular white matter change likely reflects small vessel ischemic microangiopathy. The brainstem and fourth ventricle are within normal limits. The basal ganglia are unremarkable in appearance. No midline shift is seen. Vascular: No hyperdense vessel or unexpected calcification. Skull: There is no evidence of fracture; the patient's craniotomy at the left vertex is unremarkable in appearance. Sinuses/Orbits: The visualized portions of the orbits are within normal limits. There is mild partial opacification of the right mastoid air cells. The visualized paranasal sinuses and left mastoid air cells are well-aerated. Other: No significant soft tissue abnormalities are seen. IMPRESSION: 1. Likely subarachnoid hemorrhage at the left convexity, and also at the right occipital lobe. Packing material at the left convexity, reflecting recent evacuation of a hematoma. 2. Evolving subacute infarct at the left cerebral hemisphere, primarily involving the white matter. 3. Chronic encephalomalacia at the white  matter of the right frontal lobe, reflecting remote infarct. Scattered small vessel ischemic microangiopathy. 4. Mild partial opacification of the right mastoid air cells. These results were called by telephone at the time of interpretation on 08/31/2016 at 10:24 pm to Dr. Hinda Kehr, who verbally acknowledged these results. Electronically Signed   By: Garald Balding M.D.   On: 08/31/2016 22:28   Dg Chest Port 1 View  Result Date: 08/31/2016 CLINICAL DATA:  Acute onset of sepsis. Recent CVA. Left-sided gaze. Nausea and vomiting. Initial encounter. EXAM: PORTABLE CHEST 1 VIEW COMPARISON:  Chest radiograph performed 08/09/2015 FINDINGS: The lungs are well-aerated. Peribronchial thickening is noted. Mild vascular congestion is seen. There is no evidence of  pleural effusion or pneumothorax. The cardiomediastinal silhouette is within normal limits. No acute osseous abnormalities are seen. IMPRESSION: Peribronchial thickening noted.  Mild vascular congestion is seen. Electronically Signed   By: Garald Balding M.D.   On: 08/31/2016 22:29     STUDIES:  CT head 11/17>Likely subarachnoid hemorrhage at the left convexity, and also at the right occipital lobe. Packing material at the left convexity,reflecting recent evacuation of a hematoma. Evolving subacute infarct at the left cerebral hemisphere, primarily involving the white matter. CT head 11/18 >>no changes  MRI 11/18 >>  CULTURES: BC 08/31/16> > UC 11/17 >>  ANTIBIOTICS:   SIGNIFICANT EVENTS: 11/71/7 >Transfer from Rehab to [facility] ER>/[facility] Neuro ICU for 2 new SAH   LINES/TUBES:   DISCUSSION: 65 yo with recent [place] s/p craniotomy and clot evacuation admitted with recurrent SAH   ASSESSMENT / PLAN:  PULMONARY A: Tachypnea  P:   Keep O2 sats >90%  Monitor closely for airway protection   CARDIOVASCULAR A:  HTN   P:  Labetalol IV As needed    RENAL A:   Hypokalemia  P:   Replace K + Continue IVF NS at 75cc /hr   GASTROINTESTINAL A:     P:   NPO (for cortrack , then start TF if mentation does not improve )  PPI   HEMATOLOGIC A:   No active issues  P:  Trend cbc   INFECTIOUS A:   Elevated WBC  P:   Trend Temp /WBC  Follow cx data   ENDOCRINE A:   Hypothyroid  Hyperglycemia  P:   Check TSH  Continue Synthroid  SSI   NEUROLOGIC A:   Recurrent SAH -s/p craniotomy w/ clot evacuation ~08/15/16 with recurrent SAH x 2 08/31/16  P:   Monitor mental status closely  Neuro/NS following  MRI pending  Avoid sedating rx    FAMILY  - Updates: no family available   - Inter-disciplinary family meet or Palliative Care meeting due by:  09/08/16   Rexene Edison NP -C  Pulmonary and [place] Pager: [phone]  09/11/2016, 7:33 AM  STAFF NOTE: I, Merrie Roof, MD FACP have personally reviewed patient's available data, including medical history, events of note, physical examination and test results as part of my evaluation. I have discussed with resident/NP and other care providers such as pharmacist, RN and RRT. In addition, I personally evaluated patient and elicited key findings of: in bed, mild increase RR, reduced BS, lat gaze, not fc, seems to have airway protection now, all scans reviewed, MRI done await report, K low, give supp, no ETT needed at this time, limit free water, assess abg x 1, will follow, allow pos balance, no prior aneurysm noted in past, amyloid?   Lavon Paganini. Titus Mould, MD,  FACP Pgr: [id number] [facility] 08/30/2016 12:23 PM

## 2016-09-02 ENCOUNTER — Inpatient Hospital Stay (HOSPITAL_COMMUNITY): Payer: Medicare Other

## 2016-09-02 DIAGNOSIS — Z881 Allergy status to other antibiotic agents status: Secondary | ICD-10-CM

## 2016-09-02 DIAGNOSIS — Z8673 Personal history of transient ischemic attack (TIA), and cerebral infarction without residual deficits: Secondary | ICD-10-CM

## 2016-09-02 DIAGNOSIS — F17210 Nicotine dependence, cigarettes, uncomplicated: Secondary | ICD-10-CM

## 2016-09-02 DIAGNOSIS — R7881 Bacteremia: Secondary | ICD-10-CM

## 2016-09-02 DIAGNOSIS — G9689 Other specified disorders of central nervous system: Secondary | ICD-10-CM

## 2016-09-02 DIAGNOSIS — I6789 Other cerebrovascular disease: Secondary | ICD-10-CM

## 2016-09-02 DIAGNOSIS — Z823 Family history of stroke: Secondary | ICD-10-CM

## 2016-09-02 DIAGNOSIS — Z880 Allergy status to penicillin: Secondary | ICD-10-CM

## 2016-09-02 DIAGNOSIS — Z9911 Dependence on respirator [ventilator] status: Secondary | ICD-10-CM

## 2016-09-02 DIAGNOSIS — A499 Bacterial infection, unspecified: Secondary | ICD-10-CM

## 2016-09-02 DIAGNOSIS — Z91048 Other nonmedicinal substance allergy status: Secondary | ICD-10-CM

## 2016-09-02 DIAGNOSIS — R4182 Altered mental status, unspecified: Secondary | ICD-10-CM

## 2016-09-02 DIAGNOSIS — Z8249 Family history of ischemic heart disease and other diseases of the circulatory system: Secondary | ICD-10-CM

## 2016-09-02 LAB — BLOOD GAS, ARTERIAL
ACID-BASE EXCESS: 1 mmol/L (ref 0.0–2.0)
Bicarbonate: 23.6 mmol/L (ref 20.0–28.0)
FIO2: 1
Hi Frequency JET Vent Rate: 14
O2 SAT: 99.5 %
PATIENT TEMPERATURE: 98.6
PCO2 ART: 28.7 mmHg — AB (ref 32.0–48.0)
PEEP/CPAP: 5 cmH2O
PH ART: 7.525 — AB (ref 7.350–7.450)
PO2 ART: 314 mmHg — AB (ref 83.0–108.0)
VT: 420 mL

## 2016-09-02 LAB — BASIC METABOLIC PANEL
ANION GAP: 13 (ref 5–15)
BUN: 10 mg/dL (ref 6–20)
CALCIUM: 9.1 mg/dL (ref 8.9–10.3)
CHLORIDE: 104 mmol/L (ref 101–111)
CO2: 23 mmol/L (ref 22–32)
Creatinine, Ser: 0.61 mg/dL (ref 0.44–1.00)
GFR calc non Af Amer: >60 mL/min (ref >60)
Glucose, Bld: 136 mg/dL — ABNORMAL HIGH (ref 65–99)
POTASSIUM: 3 mmol/L — AB (ref 3.5–5.1)
Sodium: 140 mmol/L (ref 135–145)

## 2016-09-02 LAB — CBC WITH DIFFERENTIAL/PLATELET
BASOS ABS: 0 10*3/uL (ref 0.0–0.1)
Basophils Relative: 0 %
EOS ABS: 0 10*3/uL (ref 0.0–0.7)
EOS PCT: 0 %
HCT: 44.1 % (ref 36.0–46.0)
Hemoglobin: 15.3 g/dL — ABNORMAL HIGH (ref 12.0–15.0)
LYMPHS PCT: 6 %
Lymphs Abs: 0.9 10*3/uL (ref 0.7–4.0)
MCH: 32.8 pg (ref 26.0–34.0)
MCHC: 34.7 g/dL (ref 30.0–36.0)
MCV: 94.6 fL (ref 78.0–100.0)
MONO ABS: 0.8 10*3/uL (ref 0.1–1.0)
Monocytes Relative: 6 %
Neutro Abs: 13.1 10*3/uL — ABNORMAL HIGH (ref 1.7–7.7)
Neutrophils Relative %: 88 %
PLATELETS: 359 10*3/uL (ref 150–400)
RBC: 4.66 MIL/uL (ref 3.87–5.11)
RDW: 12.4 % (ref 11.5–15.5)
WBC: 14.8 10*3/uL — ABNORMAL HIGH (ref 4.0–10.5)

## 2016-09-02 LAB — CSF CELL COUNT WITH DIFFERENTIAL
LYMPHS CSF: 2 % — AB (ref 40–80)
LYMPHS CSF: 2 % — AB (ref 40–80)
MONOCYTE-MACROPHAGE-SPINAL FLUID: 3 % — AB (ref 15–45)
MONOCYTE-MACROPHAGE-SPINAL FLUID: 5 % — AB (ref 15–45)
RBC Count, CSF: 13 /mm3 — ABNORMAL HIGH
RBC Count, CSF: 38 /mm3 — ABNORMAL HIGH
SEGMENTED NEUTROPHILS-CSF: 93 % — AB (ref 0–6)
SEGMENTED NEUTROPHILS-CSF: 95 % — AB (ref 0–6)
TUBE #: 1
TUBE #: 4
WBC CSF: 1160 /mm3 — AB (ref 0–5)
WBC, CSF: 1900 /mm3 (ref 0–5)

## 2016-09-02 LAB — LIPID PANEL
CHOLESTEROL: 105 mg/dL (ref 0–200)
HDL: 64 mg/dL (ref >40)
LDL Cholesterol: 31 mg/dL (ref 0–99)
TRIGLYCERIDES: 49 mg/dL (ref <150)
Total CHOL/HDL Ratio: 1.6 RATIO
VLDL: 10 mg/dL (ref 0–40)

## 2016-09-02 LAB — COMPREHENSIVE METABOLIC PANEL
ALBUMIN: 2.4 g/dL — AB (ref 3.5–5.0)
ALK PHOS: 185 U/L — AB (ref 38–126)
ALT: 22 U/L (ref 14–54)
AST: 25 U/L (ref 15–41)
Anion gap: 12 (ref 5–15)
BILIRUBIN TOTAL: 0.6 mg/dL (ref 0.3–1.2)
BUN: 12 mg/dL (ref 6–20)
CO2: 25 mmol/L (ref 22–32)
CREATININE: 0.64 mg/dL (ref 0.44–1.00)
Calcium: 9.2 mg/dL (ref 8.9–10.3)
Chloride: 108 mmol/L (ref 101–111)
GFR calc Af Amer: >60 mL/min (ref >60)
GLUCOSE: 119 mg/dL — AB (ref 65–99)
POTASSIUM: 3.1 mmol/L — AB (ref 3.5–5.1)
Sodium: 145 mmol/L (ref 135–145)
TOTAL PROTEIN: 7.8 g/dL (ref 6.5–8.1)

## 2016-09-02 LAB — URINE CULTURE

## 2016-09-02 LAB — GLUCOSE, CAPILLARY
GLUCOSE-CAPILLARY: 96 mg/dL (ref 65–99)
Glucose-Capillary: 136 mg/dL — ABNORMAL HIGH (ref 65–99)
Glucose-Capillary: 129 mg/dL — ABNORMAL HIGH (ref 65–99)
Glucose-Capillary: 120 mg/dL — ABNORMAL HIGH (ref 65–99)
Glucose-Capillary: 150 mg/dL — ABNORMAL HIGH (ref 65–99)
Glucose-Capillary: 126 mg/dL — ABNORMAL HIGH (ref 65–99)

## 2016-09-02 LAB — CBC
HEMATOCRIT: 41.1 % (ref 36.0–46.0)
HEMOGLOBIN: 14.4 g/dL (ref 12.0–15.0)
MCH: 32.7 pg (ref 26.0–34.0)
MCHC: 35 g/dL (ref 30.0–36.0)
MCV: 93.4 fL (ref 78.0–100.0)
Platelets: 391 10*3/uL (ref 150–400)
RBC: 4.4 MIL/uL (ref 3.87–5.11)
RDW: 12.2 % (ref 11.5–15.5)
WBC: 17.6 10*3/uL — AB (ref 4.0–10.5)

## 2016-09-02 LAB — CRYPTOCOCCAL ANTIGEN, CSF: CRYPTO AG: NEGATIVE

## 2016-09-02 LAB — TRIGLYCERIDES: TRIGLYCERIDES: 67 mg/dL (ref <150)

## 2016-09-02 LAB — TSH: TSH: 0.829 u[IU]/mL (ref 0.350–4.500)

## 2016-09-02 MED ORDER — CHLORHEXIDINE GLUCONATE 0.12% ORAL RINSE (MEDLINE KIT)
15.0000 mL | Freq: Two times a day (BID) | OROMUCOSAL | Status: DC
  Administered 2016-09-02 – 2016-09-13 (×22): 15 mL via OROMUCOSAL

## 2016-09-02 MED ORDER — LEVOTHYROXINE SODIUM 25 MCG PO TABS
25.0000 ug | ORAL_TABLET | Freq: Every day | ORAL | Status: DC
Start: 2016-09-03 — End: 2016-09-12
  Administered 2016-09-03 – 2016-09-12 (×10): 25 ug
  Filled 2016-09-02 (×10): qty 1

## 2016-09-02 MED ORDER — SODIUM CHLORIDE 0.9 % IV SOLN
2.0000 g | Freq: Three times a day (TID) | INTRAVENOUS | Status: DC
  Administered 2016-09-02 – 2016-09-05 (×9): 2 g via INTRAVENOUS
  Filled 2016-09-02 (×10): qty 2

## 2016-09-02 MED ORDER — FENTANYL CITRATE (PF) 100 MCG/2ML IJ SOLN
INTRAMUSCULAR | Status: AC
  Filled 2016-09-02: qty 2

## 2016-09-02 MED ORDER — PROPOFOL 1000 MG/100ML IV EMUL
INTRAVENOUS | Status: AC
  Administered 2016-09-02: 15:00:00
  Filled 2016-09-02: qty 100

## 2016-09-02 MED ORDER — SODIUM CHLORIDE 0.9 % IV SOLN
30.0000 meq | Freq: Once | INTRAVENOUS | Status: AC
  Administered 2016-09-02: 30 meq via INTRAVENOUS
  Filled 2016-09-02: qty 15

## 2016-09-02 MED ORDER — METRONIDAZOLE IN NACL 5-0.79 MG/ML-% IV SOLN
500.0000 mg | Freq: Four times a day (QID) | INTRAVENOUS | Status: DC
  Administered 2016-09-02: 500 mg via INTRAVENOUS
  Filled 2016-09-02 (×4): qty 100

## 2016-09-02 MED ORDER — PROPOFOL 1000 MG/100ML IV EMUL
5.0000 ug/kg/min | INTRAVENOUS | Status: DC
  Administered 2016-09-06: 50 ug/kg/min via INTRAVENOUS
  Filled 2016-09-02 (×3): qty 100

## 2016-09-02 MED ORDER — SODIUM CHLORIDE 0.9 % IV SOLN
500.0000 mg | Freq: Two times a day (BID) | INTRAVENOUS | Status: DC
  Administered 2016-09-02 – 2016-09-03 (×2): 500 mg via INTRAVENOUS
  Filled 2016-09-02 (×4): qty 5

## 2016-09-02 MED ORDER — ORAL CARE MOUTH RINSE
15.0000 mL | OROMUCOSAL | Status: DC
  Administered 2016-09-02 – 2016-09-14 (×108): 15 mL via OROMUCOSAL

## 2016-09-02 MED ORDER — SODIUM CHLORIDE 0.9 % IV SOLN
INTRAVENOUS | Status: AC
  Administered 2016-09-03 – 2016-09-04 (×2): via INTRAVENOUS

## 2016-09-02 MED ORDER — LINEZOLID 600 MG/300ML IV SOLN
600.0000 mg | Freq: Two times a day (BID) | INTRAVENOUS | Status: DC
  Administered 2016-09-02 – 2016-09-05 (×6): 600 mg via INTRAVENOUS
  Filled 2016-09-02 (×7): qty 300

## 2016-09-02 NOTE — Evaluation (Signed)
Speech Language Pathology Evaluation Patient Details Name: Sydney Coleman MRN: [id number] DOB: 1950/11/17 Today's Date: 09/02/2016 Time: [id number] SLP Time Calculation (min) (ACUTE ONLY): 10 min  Problem List:  Patient Active Problem List   Diagnosis Date Noted  . Superficial siderosis of central nervous system 09/02/2016  . Bacteremia 09/02/2016  . Subarachnoid (nontraumatic) hemorrhage of newborn 08/21/2016  . Subarachnoid hemorrhage ([place]) 08/30/2016  . Patella fracture 08/10/2015  . SAH (subarachnoid hemorrhage) ([place]) 05/12/2015   Past Medical History:  Past Medical History:  Diagnosis Date  . Acute spontaneous subarachnoid intracranial hemorrhage ([facility])   . Anxiety    better since pin removed;   . Arthritis   . Cancer of vulva ([place])    >15 year ago;   . Chronic headaches   . COPD (chronic obstructive pulmonary disease) ([place])   . Difficult intubation    two (2) teeth broken during intubation  . Elevated lipids   . Hypertension    controlled with medication;   . Hypothyroidism   . PONV (postoperative nausea and vomiting)   . SAH (subarachnoid hemorrhage) ([place]) 05/11/2015  . Stroke [facility])    TIA  . TIA (transient ischemic attack) 2008   Past Surgical History:  Past Surgical History:  Procedure Laterality Date  . ABDOMINAL HYSTERECTOMY    . BREAST SURGERY    . EYE SURGERY Right    Cataract Extraction  . HARDWARE REMOVAL Right 10/27/2015   Procedure: HARDWARE REMOVAL;  Surgeon: Thornton Park, MD;  Location: [facility] ORS;  Service: Orthopedics;  Laterality: Right;  . HARDWARE REMOVAL Right 05/16/2016   Procedure: HARDWARE REMOVAL;  Surgeon: Thornton Park, MD;  Location: [facility] ORS;  Service: Orthopedics;  Laterality: Right;  . JOINT REPLACEMENT Left    Partial Hip Replacement  . ORIF PATELLA Right 08/10/2015   Procedure: OPEN REDUCTION INTERNAL (ORIF) FIXATION PATELLA TENSION BAND WIRING;  Surgeon: Thornton Park, MD;  Location: [facility] ORS;  Service: Orthopedics;   Laterality: Right;  . PLACEMENT OF BREAST IMPLANTS    . THYROIDECTOMY, PARTIAL    . TONSILLECTOMY    . TOTAL HIP ARTHROPLASTY Left    HPI:  65 y.o.femalewith history of previous TIA, previous stroke, previous subarachnoid hemorrhage, hypertension, hypothyroidism, hyperlipidemia, COPD, chronic headaches, and anxiety presenting with nausea, vomiting,left gaze preference, altered mental status, and no longer speaking. Shedidnot receive IV t-PA due tonewsubarachnoid hemorrhages noted on head CT at [facility]. Per Neuro notes, Exam is out of proportion to findings on imaging. ruling out meningitis.  S/p craniotomy for left parietal hemorrhage 4 weeks ago and h/o similar aneurysm negative [facility] July 2016. Patient was at [facility] prior to admission.    Assessment / Plan / Recommendation Clinical Impression  Cognitive-linguistic evaluation complete. Patient lethargic, no response to painful stimuli in right upper extremity. Eyes opened briefly with intact blink to threat bilaterally. Patient with severe global aphasia which in addition to poorly aroused state is impacting communication. Patient non-verbal without noted attempts to verbally communicate, unable to follow basic 1-step commands. Prognosis will depend on presence of infection. Will continue to follow.     SLP Assessment  Patient needs continued Speech Lanaguage Pathology Services    Follow Up Recommendations  Skilled Nursing facility    Frequency and Duration min 2x/week  2 weeks      SLP Evaluation Cognition  Overall Cognitive Status: Impaired/Different from baseline Arousal/Alertness: Lethargic Orientation Level: Other (comment) Sydney Coleman)       Comprehension  Auditory Comprehension Overall Auditory Comprehension: Impaired Yes/No Questions: Impaired Basic  Biographical Questions: 0-25% accurate Commands: Impaired One Step Basic Commands: 0-24% accurate Reading Comprehension Reading Status: Not tested    Expression  Expression Primary Mode of Expression:  (non-verbal) Verbal Expression Overall Verbal Expression: Impaired Written Expression Dominant Hand: Right Written Expression: Not tested   Oral / Motor  Oral Motor/Sensory Function Overall Oral Motor/Sensory Function: Other (comment) (no oral movements noted) Motor Speech Overall Motor Speech:  (non-verbal)   GO                   Sydney Rainwater MA, CCC-SLP [phone]  Sydney Coleman 09/02/2016, 12:15 PM

## 2016-09-02 NOTE — Progress Notes (Signed)
STROKE TEAM PROGRESS NOTE   HISTORY OF PRESENT ILLNESS (per record) Sydney Coleman is an 65 y.o. female who was transferred from [facility] for further management of new subarachnoid hemorrhages.   Per provider notes at [place]: "Sydney Coleman a 65 y.o.femalewith a complicated medical history that most recently includes hemorrhagic stroke approximately 17 days ago while she was in [place] resulting in craniotomy and clot evacuation. She presents tonight by EMS for evaluation of gradually worsening mental status since early this morning. She is currently at [facility] for rehabilitation, and her family reports that her current status is alert and oriented times three, conversant, and able to eat without difficulty. They report that yesterday the patient started complaining of nausea and vomiting. Reportedly she was starting to act unusual and was less responsive at 8 AM today including  Some intermittant left gaze preference. By tonight she was nonverbal and appeared acutely ill. She was transported by EMS for further evaluation. Her family was present at the bedside and stated that this was very far from her post procedural baseline."  "Her last known normal was last night with only nausea and vomiting reported as symptoms yesterday. She was ill appearing upon arrival with tachycardia, tachypnea, andaltered mental status, but afebrile and normotensive (in fact slightly hypertensive).""Pt began to have nausea/vomiting yesterday. Pt's eyes began to look left at approx 8am this morning. [name] reports when she got to [facility] at 1800 today patient no longer verbal."   Family states that symptoms began yesterday in the afternoon with "not feeling well" progressing to nausea and vomiting. She was taken to [facility] where CT head revealed two new subarachnoid hemorrhages, one involving sulci of the medial left frontal lobe and one involving sulci along the right  occipital lobe. Also noted on OSH CT was recent craniotomy defect with encephalomalacia in the adjacent left parietofrontal region, consistent with evacuation of recent prior lobar hemorrhage. Chronic small vessel ischemic changes were also noted.     SUBJECTIVE (INTERVAL HISTORY)  . The patient remains nonverbal. Exam is out of proportion to findings on imaging.No witnessed seizure activity.Family arrived after rounds. HPI confirmed from husband. EEG shows severe left hemispheric slowing 2-4 hertz but no seizures. MRI shows subacute left pareital hematoima and proteinacous prodiucts in cisterns and cyst left frontal with superficial siderosis   OBJECTIVE Temp:  [97.8 F (36.6 C)-99.6 F (37.6 C)] 99.6 F (37.6 C) (11/19 0800) Pulse Rate:  [92-118] 108 (11/19 1100) Cardiac Rhythm: Sinus tachycardia (11/19 0700) Resp:  [22-40] 35 (11/19 1100) BP: (132-164)/(63-86) 160/79 (11/19 1100) SpO2:  [91 %-100 %] 100 % (11/19 1100) Weight:  [129 lb 6.6 oz (58.7 kg)] 129 lb 6.6 oz (58.7 kg) (11/19 0500)  CBC:   Recent Labs Lab 08/31/16 2149 08/29/2016 0322 09/02/16 0115  WBC 22.7* 19.6* 17.6*  NEUTROABS 20.6*  --   --   HGB 15.7 14.4 14.4  HCT 45.3 40.8 41.1  MCV 94.1 93.2 93.4  PLT 471* 383 0000000    Basic Metabolic Panel:   Recent Labs Lab 09/07/2016 0322 09/02/16 0115  NA 138 140  K 3.0* 3.0*  CL 101 104  CO2 24 23  GLUCOSE 155* 136*  BUN 11 10  CREATININE 0.61 0.61  CALCIUM 9.3 9.1    Lipid Panel:     Component Value Date/Time   CHOL 105 09/02/2016 0115   TRIG 49 09/02/2016 0115   HDL 64 09/02/2016 0115   CHOLHDL 1.6 09/02/2016 0115  VLDL 10 09/02/2016 0115   LDLCALC 31 09/02/2016 0115   HgbA1c: No results found for: HGBA1C Urine Drug Screen:     Component Value Date/Time   LABOPIA POSITIVE (A) 08/15/2016 0958   COCAINSCRNUR NONE DETECTED 08/20/2016 0958   COCAINSCRNUR NONE DETECTED 05/11/2015 2037   LABBENZ NONE DETECTED 08/18/2016 0958   AMPHETMU NONE  DETECTED 08/22/2016 0958   THCU NONE DETECTED 08/21/2016 0958   LABBARB NONE DETECTED 08/19/2016 0958      IMAGING  Ct Head Wo Contrast 09/11/2016 1. Unchanged small amounts of superior parasagittal left convexity and right occipital subarachnoid blood.  2. No new areas of hemorrhage.  3. Unchanged appearance of left parietal packing material. No midline shift, new mass effect or hydrocephalus.  4. Expected evolution of ischemia within the left hemisphere lower lobe predominantly involving the white matter and extending to the left frontal cortex.    Ct Head Wo Contrast 08/31/2016 1. Likely subarachnoid hemorrhage at the left convexity, and also at the right occipital lobe. Packing material at the left convexity, reflecting recent evacuation of a hematoma.  2. Evolving subacute infarct at the left cerebral hemisphere, primarily involving the white matter.  3. Chronic encephalomalacia at the white matter of the right frontal lobe, reflecting remote infarct. Scattered small vessel ischemic microangiopathy.  4. Mild partial opacification of the right mastoid air cells.     Dg Chest Port 1 View 08/31/2016 Peribronchial thickening noted.  Mild vascular congestion is seen.     PHYSICAL EXAM HEENT-  S/P left craniotomy. Neck  Some meningismus noted.  Lungs - Respirations unlabored.  Extremities - No edema. Warm and well-perfused.   Neurologic Examination: Ment: Drowsy. Unable to follow  even  simple motor commands. Nonverbal. Left gaze preference. Pupils equal sluggishly reactive. Corneals present bilaterally.Fundi not visualized/ CN: PERRL. Decreased blink to threat on right. Left gaze preference. Will track visual stimuli to left and right with saccadic visual pursuits noted. Face symmetric. Decreased sensitivity to tactile stimulation on right. Head turns preferentially to left.  Motor. Flaccid RUE. 2/5 flexion response to noxious right foot and ankle. LUE and LLE 5/5.  Sensory.  Reacts to noxious LUE and LLE. Decreased sensation RLE. No reaction to sensory stimuli RUE.  Reflexes: 1+ right biceps and brachioradialis, 2+ left biceps and brachioradialis, 1+ right patella, 3+ left patella, 1+ ankles. Right toe upgoing, left toe downgoing.  Cerebellar: No gross ataxia with LUE movements.  Gait: Unable to assess.     ASSESSMENT/PLAN Ms. Sydney Coleman is a 65 y.o. female with history of previous TIA, previous stroke, previous subarachnoid hemorrhage, hypertension, hypothyroidism, hyperlipidemia, COPD, chronic headaches, and anxiety presenting with nausea, vomiting, left gaze preference, altered mental status, and no longer speaking.  She did not receive IV t-PA due to new subarachnoid hemorrhages.  Bilateral subarachnoid hemorrhages:  Bilateral SAH - cause unknown. S/p craniotomy for left pareital hemorrhage 4 weeks ago. 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  MRI  - pending  MRA - pending  MRV - pending  EEG - pending  Carotid Doppler - not indicated  2D Echo - not indicated  UDS - pending  U/A and culture - pending  TSH - pending  Blood cultures - pending  LDL - pending  HgbA1c -  pending  VTE prophylaxis - SCDs Diet NPO time specified  aspirin 81 mg daily prior to admission, now on No antithrombotic  Ongoing aggressive stroke risk factor  management  Therapy recommendations: pending  Disposition: Pending  Hypertension  Stable  Permissive hypertension (OK if < 220/120) but gradually normalize in 5-7 days  Long-term BP goal normotensive  Hyperlipidemia  Home meds:  Pravachol 20 mg daily prior to admission   Other Stroke Risk Factors  Advanced age  Cigarette smoker - will be advised to stop smoking.  Hx stroke/TIA  Family hx stroke (mother)   Other Active Problems  Hypokalemia - 3.4 -> 3.0 (supplemented)  Elevated glucose - check hemoglobin A1c  Leukocytosis -  22.7 -> 19.6 (low-grade fever - 99.9)  Hx of elevated serum homocysteine, C-reactive protein, and protein C activity in 2016  Left craniotomy site weeping with slight amount of pus per Dr Cheral Marker - consult NS  NPO  Superficial siderosis  Bacteremia  UTI   PLAN  Spinal tap to l/f meningitis  Broaden antibiotic coverage per PCCM to cover intracranial spread of infection.   Obtain outside records ( status post craniotomy at hospital in [facility])  [place] cerebral angiogram till intracranial infection ruled out.    Hospital day # 1  Mikey Bussing PA-C [facility] Pager [phone] 09/02/2016, 11:32 AM I have personally examined this patient, reviewed notes, independently viewed imaging studies, participated in medical decision making and plan of care.ROS completed by me personally and pertinent positives fully documented  I have made any additions or clarifications directly to the above note. Agree with note above.Unusual recurrent convexity SAH of unknown cause. Cath angio negative in July 2016 but will need to be repeated next week.Resolving subacute left pareital hematoma.Positive blood culture raises possibility of cerebritis/meningitis. Check  Spinal tap for meningitis. Superficial siderosis suggests several small SAH over time ? Source of bleeds D/w husbamd, daughter, Dr Ditty neurosurgeon and Dr Titus Mould and answered questions. This patient is critically ill and at significant risk of neurological worsening, death and care requires constant monitoring of vital signs, hemodynamics,respiratory and cardiac monitoring, extensive review of multiple databases, frequent neurological assessment, discussion with family, other specialists and medical decision making of high complexity.I have made any additions or clarifications directly to the above note.This critical care time does not reflect procedure time, or teaching time or supervisory  time of PA/NP/Med Resident etc but could involve care discussion time.  I spent 45 minutes of neurocritical care time  in the care of  this patient.     Antony Contras, MD Medical Director [facility] Pager: [phone] 09/02/2016 11:32 AM   To contact Stroke Continuity provider, please refer to [email or web address] After hours, contact General Neurology

## 2016-09-02 NOTE — Consult Note (Addendum)
[facility] for Infectious Disease       Reason for Consult: possible meningitis    Referring Physician: Dr. Titus Mould  Active Problems:   Subarachnoid (nontraumatic) hemorrhage of newborn   Subarachnoid hemorrhage ([place])   Superficial siderosis of central nervous system   Bacteremia   . insulin aspart  2-6 Units Subcutaneous Q4H  . [START ON 09/03/2016] levothyroxine  25 mcg Per Tube QAC breakfast  . linezolid (ZYVOX) IV  600 mg Intravenous Q12H  . meropenem (MERREM) IV  2 g Intravenous Q12H  . metronidazole  500 mg Intravenous Q6H  . pantoprazole (PROTONIX) IV  40 mg Intravenous QHS  . propofol      . senna-docusate  1 tablet Oral BID  . valproate sodium  250 mg Intravenous Q8H  . cyanocobalamin  100 mcg Oral Daily    Recommendations: Continue with linezolid and meropenem D/c flagyl No antifungal indicated at this time  Assessment: AMS with suspicious LP findings and cell count pending in the setting of recent craniotomy and clot evacuation from recent hemorrhagic stroke.  Also with multiple allergies. Positive blood culture with Serratia, sensitivities pending.     ADDENDUM: Many WBCs (1,900), extracellular bacteria noted.  Awaiting micro.      Antibiotics: Linezolid, meropenem, flagyl  HPI: Sydney Coleman is a 65 y.o. female with PMH of hemorrhagic stroke 17 years ago with craniotomy and clot evacuation and recent craniotomy with left parietal hemorrhage 4 weeks ago who came in with AMS and bilateral SAH.  AMS out of proportion to MRI findings and had further work up including LP and initial impression by Dr. Titus Mould is an infectious cause with thick bright yellow spinal fluid, concern for meningitis, abscess.  Opening pressure not elevated.  Results pending.  She is currently intubated.  Placed on droplet isolation by CCM.    Review of Systems:  Unable to be assessed due to mental status All other systems reviewed and are negative    Past Medical History:   Diagnosis Date  . Acute spontaneous subarachnoid intracranial hemorrhage (HCC)   . Anxiety    better since pin removed;   . Arthritis   . Cancer of vulva ([place])    >15 year ago;   . Chronic headaches   . COPD (chronic obstructive pulmonary disease) ([place])   . Difficult intubation    two (2) teeth broken during intubation  . Elevated lipids   . Hypertension    controlled with medication;   . Hypothyroidism   . PONV (postoperative nausea and vomiting)   . SAH (subarachnoid hemorrhage) ([place]) 05/11/2015  . Stroke [facility])    TIA  . TIA (transient ischemic attack) 2008    Social History  Substance Use Topics  . Smoking status: Current Every Day Smoker    Packs/day: 1.00    Types: Cigarettes  . Smokeless tobacco: Never Used     Comment: Refuses to tell how much she smokes   . Alcohol use No    Family History  Problem Relation Age of Onset  . Congestive Heart Failure Father   . Stroke Mother   . Heart disease Mother     Allergies  Allergen Reactions  . Penicillins Shortness Of Breath    Has patient had a PCN reaction causing immediate rash, facial/tongue/throat swelling, SOB or lightheadedness with hypotension: Yes Has patient had a PCN reaction causing severe rash involving mucus membranes or skin necrosis: No Has patient had a PCN reaction that required hospitalization Yes  Has patient had a PCN reaction occurring within the last 10 years: Yes If all of the above answers are "NO", then may proceed with Cephalosporin use.   Marland Kitchen Amoxil [Amoxicillin] Rash    Has patient had a PCN reaction causing immediate rash, facial/tongue/throat swelling, SOB or lightheadedness with hypotension: Yes Has patient had a PCN reaction causing severe rash involving mucus membranes or skin necrosis: Yes Has patient had a PCN reaction that required hospitalization No Has patient had a PCN reaction occurring within the last 10 years: Yes If all of the above answers are "NO", then may proceed with  Cephalosporin use.   . Augmentin [Amoxicillin-Pot Clavulanate] Rash    Has patient had a PCN reaction causing immediate rash, facial/tongue/throat swelling, SOB or lightheadedness with hypotension: Yes Has patient had a PCN reaction causing severe rash involving mucus membranes or skin necrosis: Yes Has patient had a PCN reaction that required hospitalization No Has patient had a PCN reaction occurring within the last 10 years: Yes If all of the above answers are "NO", then may proceed with Cephalosporin use.   . Other Rash    Elastic  . Vancomycin Rash    Physical Exam: Constitutional: in no apparent distress, intubated, sedated Vitals:   09/02/16 1100 09/02/16 1200  BP: (!) 160/79 (!) 157/76  Pulse: (!) 108 (!) 114  Resp: (!) 35 (!) 35  Temp:  98.7 F (37.1 C)   EYES: anicteric, up gaze ENMT: +ET Cardiovascular: Cor RRR Respiratory: CTA B, anterior exam; on vent GI: Bowel sounds are normal, liver is not enlarged, spleen is not enlarged Musculoskeletal: no pedal edema noted Skin: negatives: no rash Hematologic: no cervical lad  Lab Results  Component Value Date   WBC 17.6 (H) 09/02/2016   HGB 14.4 09/02/2016   HCT 41.1 09/02/2016   MCV 93.4 09/02/2016   PLT 391 09/02/2016    Lab Results  Component Value Date   CREATININE 0.61 09/02/2016   BUN 10 09/02/2016   NA 140 09/02/2016   K 3.0 (L) 09/02/2016   CL 104 09/02/2016   CO2 23 09/02/2016    Lab Results  Component Value Date   ALT 21 08/30/2016   AST 18 09/12/2016   ALKPHOS 170 (H) 08/29/2016     Microbiology: Recent Results (from the past 240 hour(s))  Urine culture     Status: Abnormal (Preliminary result)   Collection Time: 08/31/16 10:11 PM  Result Value Ref Range Status   Specimen Description URINE, RANDOM  Final   Special Requests NONE  Final   Culture >=100,000 COLONIES/mL ESCHERICHIA COLI (A)  Final   Report Status PENDING  Incomplete  Blood Culture (routine x 2)     Status: Abnormal  (Preliminary result)   Collection Time: 08/31/16 10:15 PM  Result Value Ref Range Status   Specimen Description BLOOD RIGHT ASSIST CONTROL  Final   Special Requests   Final    BOTTLES DRAWN AEROBIC AND ANAEROBIC 14CCAERO,13CCANA   Culture  Setup Time   Final    GRAM NEGATIVE RODS IN BOTH AEROBIC AND ANAEROBIC BOTTLES CRITICAL RESULT CALLED TO, READ BACK BY AND VERIFIED WITH: HOLLY GILLIAM ON 09/10/2016 AT 1401 BY [facility]    Culture (A)  Final    SERRATIA MARCESCENS CULTURE REINCUBATED FOR BETTER GROWTH Performed at [facility]    Report Status PENDING  Incomplete  Blood Culture ID Panel (Reflexed)     Status: Abnormal   Collection Time: 08/31/16 10:15 PM  Result Value Ref  Range Status   Enterococcus species NOT DETECTED NOT DETECTED Final   Listeria monocytogenes NOT DETECTED NOT DETECTED Final   Staphylococcus species NOT DETECTED NOT DETECTED Final   Staphylococcus aureus NOT DETECTED NOT DETECTED Final   Streptococcus species NOT DETECTED NOT DETECTED Final   Streptococcus agalactiae NOT DETECTED NOT DETECTED Final   Streptococcus pneumoniae NOT DETECTED NOT DETECTED Final   Streptococcus pyogenes NOT DETECTED NOT DETECTED Final   Acinetobacter baumannii NOT DETECTED NOT DETECTED Final   Enterobacteriaceae species DETECTED (A) NOT DETECTED Final    Comment: CRITICAL RESULT CALLED TO, READ BACK BY AND VERIFIED WITH: HOLLY GILLIAM ON 09/12/2016 AT 1401 BY [facility]    Enterobacter cloacae complex NOT DETECTED NOT DETECTED Final   Escherichia coli NOT DETECTED NOT DETECTED Final   Klebsiella oxytoca NOT DETECTED NOT DETECTED Final   Klebsiella pneumoniae NOT DETECTED NOT DETECTED Final   Proteus species NOT DETECTED NOT DETECTED Final   Serratia marcescens DETECTED (A) NOT DETECTED Final    Comment: CRITICAL RESULT CALLED TO, READ BACK BY AND VERIFIED WITH: HOLLY GILLIAM ON 08/27/2016 AT 1401 BY [facility]    Carbapenem resistance NOT DETECTED NOT DETECTED Final   Haemophilus influenzae  NOT DETECTED NOT DETECTED Final   Neisseria meningitidis NOT DETECTED NOT DETECTED Final   Pseudomonas aeruginosa NOT DETECTED NOT DETECTED Final   Candida albicans NOT DETECTED NOT DETECTED Final   Candida glabrata NOT DETECTED NOT DETECTED Final   Candida krusei NOT DETECTED NOT DETECTED Final   Candida parapsilosis NOT DETECTED NOT DETECTED Final   Candida tropicalis NOT DETECTED NOT DETECTED Final  Blood Culture (routine x 2)     Status: None (Preliminary result)   Collection Time: 08/31/16 10:33 PM  Result Value Ref Range Status   Specimen Description BLOOD RIGHT ARM  Final   Special Requests   Final    BOTTLES DRAWN AEROBIC AND ANAEROBIC 11CCAERO,12CCANA   Culture  Setup Time   Final    GRAM NEGATIVE RODS IN BOTH AEROBIC AND ANAEROBIC BOTTLES CRITICAL VALUE NOTED.  VALUE IS CONSISTENT WITH PREVIOUSLY REPORTED AND CALLED VALUE. Performed at [facility]  Final   Report Status PENDING  Incomplete  MRSA PCR Screening     Status: None   Collection Time: 09/02/2016  5:19 AM  Result Value Ref Range Status   MRSA by PCR NEGATIVE NEGATIVE Final    Comment:        The GeneXpert MRSA Assay (FDA approved for NASAL specimens only), is one component of a comprehensive MRSA colonization surveillance program. It is not intended to diagnose MRSA infection nor to guide or monitor treatment for MRSA infections.     Scharlene Gloss, [facility] for Infectious Disease [place] [email or web address] [phone] pager  [phone] cell 09/02/2016, 2:16 PM

## 2016-09-02 NOTE — Consult Note (Signed)
PULMONARY / CRITICAL CARE MEDICINE   Name: Sydney Coleman MRN: [id number] DOB: 06/23/1951    ADMISSION DATE:  09/07/2016 CONSULTATION DATE:  09/04/2016  REFERRING MD:  Neuro /Dr. Cheral Marker   CHIEF COMPLAINT:  SAH   HISTORY OF PRESENT ILLNESS:   Pt admitted to Neuro ICU on 08/18/2016 for new subarachnoid hemorrhages.  She had a recent hemorrhagic stroke around 08/15/16 in [place] resulting in craniotomy and clot evacuation. She was brought in to ER for gradually worsening mental status that started morning of 08/31/16 . She was at [facility] for rehabilitation,, and her family reported that her baseline mental status was alert and oriented times three, conversant, and able to eat without difficulty. They reported that yesterday the patient started complaining of nausea and vomiting. Reportedly she was starting to act unusual and be less responsive at 8 AM on 08/31/16  including with some left gaze preference but not consistently so. Later in day on 08/31/16 she became nonverbal and acutely ill. She was transported by EMS for further evaluation.     She was taken to [facility] ER where CT head revealed two new subarachnoid hemorrhages, one involving sulci of the medial left frontal lobe and one involving sulci along the right occipital lobe. Also noted on OSH CT was recent craniotomy defect with encephalomalacia in the adjacent left parietofrontal region, consistent with evacuation of recent prior lobar hemorrhage. Chronic small vessel ischemic changes were also noted., evolving subacute infarct of left cerebral hemisphere . She was transferred to [facility] hospital to Neuro ICU . PCCM consulted . She remains confused not following commands. She is awake and moving left side w/ right sided weakness with non purposeful movements. O2 sats are good at 97% on room air. Does have tachypnea but appears comfortable w/ no increased wob. She is going to MRI later today    SUBJECTIVE:  No  significant change in mental status , awake and alert, not following commands.  BC panel + serratia   VITAL SIGNS: BP (!) 164/84   Pulse (!) 106   Temp 99.6 F (37.6 C) (Axillary)   Resp (!) 35   Ht _0  (1.6 m)   Wt 58.7 kg (129 lb 6.6 oz)   SpO2 99%   BMI 22.92 kg/m   HEMODYNAMICS:    VENTILATOR SETTINGS:    INTAKE / OUTPUT: I/O last 3 completed shifts: In: 2465 [I.V.:1695; IV Piggyback:770] Out: 1200 [Urine:1200]  PHYSICAL EXAMINATION: General:  Awake and alert ,not tracking  Neuro:  Alert, moving left side ,  not following commands, nonverbal ,  HEENT:  Oral mucosa dry , PERRL , Cardiovascular:  RRR  Lungs:  Decreased BS in bases w/ no wheezing  Abdomen:  Soft , NT ,  Hypoactive BS  Musculoskeletal:  Moving left side , right sided weakness  Skin: scalp incision w/ crusting noted.   LABS:  BMET  Recent Labs Lab 08/31/16 2149 09/07/2016 0322 09/02/16 0115  NA 132* 138 140  K 3.4* 3.0* 3.0*  CL 94* 101 104  CO2 _1 BUN _2 CREATININE 0.84 0.61 0.61  GLUCOSE 261* 155* 136*    Electrolytes  Recent Labs Lab 08/31/16 2149 09/04/2016 0322 09/02/16 0115  CALCIUM 9.7 9.3 9.1    CBC  Recent Labs Lab 08/31/16 2149 09/11/2016 0322 09/02/16 0115  WBC 22.7* 19.6* 17.6*  HGB 15.7 14.4 14.4  HCT 45.3 40.8 41.1  PLT 471* 383 391    Coag's  Recent Labs Lab 08/31/16 2149 09/05/2016 0322  APTT 29 29  INR 1.02 1.20    Sepsis Markers  Recent Labs Lab 08/31/16 2211 08/20/2016 0700  LATICACIDVEN 2.5* 1.2    ABG  Recent Labs Lab 08/21/2016 1534  PHART 7.506*  PCO2ART 32.3  PO2ART 76.0*    Liver Enzymes  Recent Labs Lab 08/31/16 2149 08/22/2016 0322  AST 22 18  ALT 26 21  ALKPHOS 197* 170*  BILITOT 0.7 0.8  ALBUMIN 3.5 2.8*    Cardiac Enzymes  Recent Labs Lab 08/31/16 2149  TROPONINI <0.03    Glucose  Recent Labs Lab 09/12/2016 1206 08/23/2016 1635 08/22/2016 2014 09/13/2016 2330 09/02/16 0311 09/02/16 0734   GLUCAP 148* 143* 146* 149* 136* 129*    Imaging Mr Brain W Wo Contrast  Result Date: 08/31/2016 CLINICAL DATA:  Subarachnoid hemorrhage. History of parenchymal hemorrhage 08/15/2016 in [place] requiring evacuation. Worsening mental status starting 08/31/2016. EXAM: MRI HEAD WITHOUT AND WITH CONTRAST TECHNIQUE: Multiplanar, multiecho pulse sequences of the brain and surrounding structures were obtained without and with intravenous contrast. CONTRAST:  65m MULTIHANCE GADOBENATE DIMEGLUMINE 529 MG/ML IV SOLN COMPARISON:  Head CT from earlier today and yesterday. Brain MRI 05/12/2015 FINDINGS: Brain: Rim enhancing collection in the parasagittal posterior left cerebral hemisphere measuring up to 7 cm in length. This has internal hypo intense packing material, hemosiderin rim, and T1 hyperintense components consistent with chronic blood products. Internal restricted diffusion is attributed to susceptibility artifact given the other signs of hemorrhage. Similar findings are seen in a smaller cystic area in the anterior left frontal lobe measuring 17 mm, which also has hemosiderin rim, diffusion hyperintensity, and T1 hyperintensity attributed to chronic blood products. No suspected abscess. Material with similar imaging characteristics is also seen casting the dependent lateral ventricles and in the bilateral infratentorial cisterns, greatest along the left CP angle and pre pontine cistern. Given the constellation of findings is is likely subacute hemorrhage. Infectious debris is considered unlikely in the absence ependymal or leptomeningeal enhancement. Isodense clot is stable compared to the recent CTs. Infratentorial clot was difficult to see on the previous CTs but likely stable in retrospect. Probable infarct in the left splenium of the corpus callosum, neighboring the hematoma. There is a punctate infarct in the left cerebellum. Lateral ventriculomegaly that is new compared to 2016 brain MRI.  Periventricular FLAIR hyperintensity could be a degree of CSF re- absorption, but was also seen previously and is primarily attributed to chronic microvascular disease. Superficial siderosis in the bifrontal sulci better seen on previous MRI. These changes have progressed posteriorly. Vascular: Preserved Skull and upper cervical spine: Left parietal craniotomy. No acute finding. Sinuses/Orbits: Bilateral cataract resection.  Nasal enteric tube. IMPRESSION: 1. Subacute parenchymal hematoma in the left cerebral hemisphere, the largest is along the left frontal parietal junction has edema, local mass effect, and measures up to 7 cm. 2. Subacute hemorrhage or less likely debris in infratentorial cisterns and lateral ventricles. Mild lateral ventricular hydrocephalus when compared to 2016 comparison. 3. Periventricular T2 hyperintensity is likely a combination of CSF re- absorption and the chronic microvascular disease that was seen in 2016. 4. Superficial siderosis around the cerebral convexities, progressed from 2016. Is there known bleeding diastasis or vascular lesion? Electronically Signed   By: JMonte FantasiaM.D.   On: 08/28/2016 12:32   Dg Abd Portable 1v  Result Date: 09/02/2016 CLINICAL DATA:  Feeding tube placement EXAM: PORTABLE ABDOMEN - 1 VIEW COMPARISON:  None. FINDINGS: Feeding tube is in place  with the tip at the ligament of Treitz. Nonobstructive bowel gas pattern. No free air organomegaly. IMPRESSION: Feeding tube tip at the ligament of Treitz. Electronically Signed   By: Rolm Baptise M.D.   On: 09/10/2016 14:24     STUDIES:  CT head 11/17>Likely subarachnoid hemorrhage at the left convexity, and also at the right occipital lobe. Packing material at the left convexity,reflecting recent evacuation of a hematoma. Evolving subacute infarct at the left cerebral hemisphere, primarily involving the white matter. CT head 11/18 >>no changes  MRI 11/18 >>. Subacute parenchymal hematoma in the  left cerebral hemisphere, the largest is along the left frontal parietal junction has edema,local mass effect, and measures up to 7 cm.2. Subacute hemorrhage or less likely debris in infratentorial cisterns and lateral ventricles. Mild lateral ventricularhydrocephalus when compared to 2016 comparison.. Periventricular T2 hyperintensity is likely a combination of CSFre- absorption and the chronic microvascular disease that was seenin 2016.4. Superficial siderosis around the cerebral convexities, progressedfrom 2016  EEG 11/18>>no seizure, global encephalopathic, mod to severe diffuse slowing.    CULTURES: BC 08/31/16> > UC 11/17 >>  ANTIBIOTICS: Merrem 11/18 >> Flagyl 11/19 >> linazolid 11/17>>>  SIGNIFICANT EVENTS: 11/71/7 >Transfer from Rehab to [facility] ER>/[facility] Neuro ICU for 2 new SAH   LINES/TUBES:  DISCUSSION: 65 yo with recent SAH s/p craniotomy and clot evacuation admitted with recurrent SAH .BC panel with + serration. Broad Spectrum abx added . LP pending.   ASSESSMENT / PLAN:  PULMONARY A: Tachypnea ?secondary neuro / infectious etiology  P:   Keep O2 sats >90%  Monitor closely for airway protection   CARDIOVASCULAR A:  HTN permissive (ok <220/120 but grad. Normalize in 5-7 d per neuro)   P:  Labetalol IV As needed   Hold norvasc /cozaar for now   RENAL A:   Hypokalemia  P:   Replace K + Continue IVF NS at 75cc /hr   GASTROINTESTINAL A:     P:   Add TF  PPI   HEMATOLOGIC A:   No active issues  P:  Trend cbc   INFECTIOUS A:   Bacteremia -Serratia on BC panel  ? Brain abscess source   P:   Trend Temp /WBC  Follow cx data  Add Flagyl  May need to add Zyvox -Vanc allergy .  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 I have discussed with resident/NP and other care providers such as pharmacist, RN and RRT. In addition, I personally evaluated patient and elicited key findings of:  unresponsive status remains, lungs clear, no neck stiffness, BC noted serratia, she has been treated already with meropenem pending sensitivity, abg reviewed, resp alk, I would agree  her neurostatus is way out of proportion to her [facility] CT /MRI findings, it is likely that urine is source of gram neg in blood , however, she has had crani and is at risk for brain abscess / meningitis, would LP, given recent instrumentation and above clinical status would add flagyl, linazolid until we r/o additional organism / brain abscess, on linaz will follow plat count, coags are wnl for LP The patient is critically ill with multiple organ systems failure and requires high complexity decision making for assessment and support, frequent evaluation and titration of therapies, application of advanced monitoring technologies and extensive interpretation of multiple databases.   Critical Care Time devoted to patient care services described in this note is 35 Minutes. This time reflects time of care of this signee: Merrie Roof, MD FACP. This critical care time does not reflect procedure time, or teaching time or supervisory time of PA/NP/Med student/Med Resident etc but could involve care discussion time. Rest per NP/medical  resident whose note is outlined above and that I agree with   Lavon Paganini. Titus Mould, MD, [place] Pgr: [place] [facility] 09/02/2016 12:16 PM

## 2016-09-02 NOTE — Progress Notes (Addendum)
Notified by micro lab about CSF gram stain showing Gram negative rods. Reviewed additional CSF results:   CSF wbc 1900 (tube 4) 95% PMNs CSF RBC 38 CSF glucose <20 CSF protein 978 CSF crypto antigen negative CSF cx pending CSF HSV PCR pending CSF VDRL pending  Patient is s/p recent neurosurgical procedure with evacuation of intracerebral hematoma at OSH. CSF consistent with post-neurosurgical bacterial meningitis which may be the result of gram-negative organisms (pseudomonas, acinetobacter).She has Serratia and enterobacter growing on blood cx, both of which are associated with post-neurosurgical bacterial meningitis. Patient already on meropenem and linezolid. Will continue both and increase meropenem dosing from q12h to q8h for now for meningitis coverage pending final ID and sensitivities. ID consulted, appreciate their assistance.

## 2016-09-02 NOTE — Procedures (Signed)
Lumbar Puncture Procedure Note  Pre-operative Diagnosis: r/o meningitis, brain abscess  Post-operative Diagnosis: Likely meningitis, brain abscess  Indications: Diagnostic  Procedure Details   Consent: Informed consent was obtained. Risks of the procedure were discussed including: infection, bleeding, pain and headache.  The patient was positioned under sterile conditions. Betadine solution and sterile drapes were utilized. A spinal needle was inserted at the L3 - L4 interspace.  Spinal fluid was obtained and sent to the laboratory.  Findings 22mL of thick yellow bright  spinal fluid was obtained. Opening Pressure: lowcm H2O pressure. Closing Pressure: low cm H2O pressure.  Complications:  None; patient tolerated the procedure well.        Condition: stable  Plan Bed rest for 5 hours.udpated NS, neuro and husband  Highly abnormal csf  Lavon Paganini. Titus Mould, MD, [place] Pgr: [place] [facility]

## 2016-09-02 NOTE — Progress Notes (Signed)
[name] called to inform two residents of the rehab facility ([facility] in [place], [place]) were dx with legionella from the water. MD notified. Will test urine.

## 2016-09-02 NOTE — Procedures (Signed)
Intubation Procedure Note Sydney Coleman [id number] 1950/11/27  Procedure: Intubation Indications: Respiratory insufficiency  Procedure Details Consent: Risks of procedure as well as the alternatives and risks of each were explained to the (patient/caregiver).  Consent for procedure obtained. Time Out: Verified patient identification, verified procedure, site/side was marked, verified correct patient position, special equipment/implants available, medications/allergies/relevent history reviewed, required imaging and test results available.  Performed  Maximum sterile technique was used including gown, hand hygiene and mask.  MAC and 3    Evaluation Hemodynamic Status: BP stable throughout; O2 sats: stable throughout Patient's Current Condition: stable Complications: No apparent complications Patient did tolerate procedure well. Chest X-ray ordered to verify placement.  CXR: pending.   Raylene Miyamoto. 09/02/2016  Airway with dry hard yellow secretions massive 'she has NOT been protecting airway No trauma  Glide 3 used  Lavon Paganini. Titus Mould, MD, [place] Pgr: [place] [facility]

## 2016-09-03 DIAGNOSIS — R0603 Acute respiratory distress: Secondary | ICD-10-CM

## 2016-09-03 DIAGNOSIS — I6789 Other cerebrovascular disease: Secondary | ICD-10-CM

## 2016-09-03 DIAGNOSIS — G40909 Epilepsy, unspecified, not intractable, without status epilepticus: Secondary | ICD-10-CM

## 2016-09-03 DIAGNOSIS — Z79899 Other long term (current) drug therapy: Secondary | ICD-10-CM

## 2016-09-03 DIAGNOSIS — G968 Other specified disorders of central nervous system: Secondary | ICD-10-CM

## 2016-09-03 LAB — CBC
HCT: 40.2 % (ref 36.0–46.0)
HEMOGLOBIN: 13.8 g/dL (ref 12.0–15.0)
MCH: 32.2 pg (ref 26.0–34.0)
MCHC: 34.3 g/dL (ref 30.0–36.0)
MCV: 93.9 fL (ref 78.0–100.0)
Platelets: 325 10*3/uL (ref 150–400)
RBC: 4.28 MIL/uL (ref 3.87–5.11)
RDW: 12.6 % (ref 11.5–15.5)
WBC: 13 10*3/uL — ABNORMAL HIGH (ref 4.0–10.5)

## 2016-09-03 LAB — PHOSPHORUS: PHOSPHORUS: 1.6 mg/dL — AB (ref 2.5–4.6)

## 2016-09-03 LAB — PROTEIN AND GLUCOSE, CSF: Total  Protein, CSF: >600 mg/dL — ABNORMAL HIGH (ref 15–45)

## 2016-09-03 LAB — CULTURE, BLOOD (ROUTINE X 2)

## 2016-09-03 LAB — BASIC METABOLIC PANEL
Anion gap: 12 (ref 5–15)
BUN: 13 mg/dL (ref 6–20)
CALCIUM: 8.6 mg/dL — AB (ref 8.9–10.3)
CHLORIDE: 113 mmol/L — AB (ref 101–111)
CO2: 24 mmol/L (ref 22–32)
CREATININE: 0.64 mg/dL (ref 0.44–1.00)
GFR calc non Af Amer: >60 mL/min (ref >60)
Glucose, Bld: 126 mg/dL — ABNORMAL HIGH (ref 65–99)
Potassium: 2.8 mmol/L — ABNORMAL LOW (ref 3.5–5.1)
SODIUM: 149 mmol/L — AB (ref 135–145)

## 2016-09-03 LAB — GLUCOSE, CAPILLARY
GLUCOSE-CAPILLARY: 127 mg/dL — AB (ref 65–99)
Glucose-Capillary: 123 mg/dL — ABNORMAL HIGH (ref 65–99)
Glucose-Capillary: 112 mg/dL — ABNORMAL HIGH (ref 65–99)
Glucose-Capillary: 114 mg/dL — ABNORMAL HIGH (ref 65–99)
Glucose-Capillary: 158 mg/dL — ABNORMAL HIGH (ref 65–99)
Glucose-Capillary: 153 mg/dL — ABNORMAL HIGH (ref 65–99)

## 2016-09-03 LAB — MAGNESIUM: Magnesium: 2 mg/dL (ref 1.7–2.4)

## 2016-09-03 LAB — LEGIONELLA PNEUMOPHILA SEROGP 1 UR AG: L. pneumophila Serogp 1 Ur Ag: NEGATIVE

## 2016-09-03 LAB — HIV ANTIBODY (ROUTINE TESTING W REFLEX): HIV Screen 4th Generation wRfx: NONREACTIVE

## 2016-09-03 LAB — HEMOGLOBIN A1C
Hgb A1c MFr Bld: 5.7 % — ABNORMAL HIGH (ref 4.8–5.6)
Mean Plasma Glucose: 117 mg/dL

## 2016-09-03 MED ORDER — POTASSIUM CHLORIDE 20 MEQ/15ML (10%) PO SOLN
40.0000 meq | Freq: Once | ORAL | Status: AC
  Administered 2016-09-03: 40 meq
  Filled 2016-09-03: qty 30

## 2016-09-03 MED ORDER — LORAZEPAM 2 MG/ML IJ SOLN
2.0000 mg | INTRAMUSCULAR | Status: DC | PRN
  Administered 2016-09-04 (×2): 2 mg via INTRAVENOUS
  Filled 2016-09-03 (×3): qty 1

## 2016-09-03 MED ORDER — SODIUM CHLORIDE 0.9% FLUSH: 10.0000 mL | INTRAVENOUS | Status: DC | PRN

## 2016-09-03 MED ORDER — VITAL AF 1.2 CAL PO LIQD
1000.0000 mL | ORAL | Status: DC
  Administered 2016-09-03 – 2016-09-11 (×7): 1000 mL
  Filled 2016-09-03 (×2): qty 1000

## 2016-09-03 MED ORDER — SODIUM CHLORIDE 0.9 % IV SOLN
Freq: Once | INTRAVENOUS | Status: AC
  Administered 2016-09-03: 13:00:00 via INTRAVENOUS
  Filled 2016-09-03 (×2): qty 1000

## 2016-09-03 MED ORDER — SODIUM CHLORIDE 0.9 % IV SOLN
1000.0000 mg | Freq: Two times a day (BID) | INTRAVENOUS | Status: DC
  Administered 2016-09-03 – 2016-09-12 (×18): 1000 mg via INTRAVENOUS
  Filled 2016-09-03 (×19): qty 10

## 2016-09-03 NOTE — Progress Notes (Signed)
PT Cancellation Note  Patient Details Name: Sydney Coleman MRN: [id number] DOB: 25-May-1951   Cancelled Treatment:    Reason Eval/Treat Not Completed: Patient not medically ready   Karimah Winquist A Rosalie Gelpi 09/03/2016, 12:00 PM Wray Kearns, [place], DPT [phone]

## 2016-09-03 NOTE — Consult Note (Signed)
PULMONARY / CRITICAL CARE MEDICINE   Name: Sydney Coleman MRN: [id number] DOB: 12-21-1950    ADMISSION DATE:  08/15/2016 CONSULTATION DATE:  08/26/2016  REFERRING MD:  Neuro /Dr. Cheral Marker   CHIEF COMPLAINT:  SAH   HISTORY OF PRESENT ILLNESS:   Pt admitted to Neuro ICU on 08/15/2016 for new subarachnoid hemorrhages.  She had a recent hemorrhagic stroke around 08/15/16 in [place] resulting in craniotomy and clot evacuation. She was brought in to ER for gradually worsening mental status that started morning of 08/31/16 . She was at [facility] for rehabilitation,, and her family reported that her baseline mental status was alert and oriented times three, conversant, and able to eat without difficulty. They reported that yesterday the patient started complaining of nausea and vomiting. Reportedly she was starting to act unusual and be less responsive at 8 AM on 08/31/16  including with some left gaze preference but not consistently so. Later in day on 08/31/16 she became nonverbal and acutely ill. She was transported by EMS for further evaluation.     She was taken to [facility] ER where CT head revealed two new subarachnoid hemorrhages, one involving sulci of the medial left frontal lobe and one involving sulci along the right occipital lobe. Also noted on OSH CT was recent craniotomy defect with encephalomalacia in the adjacent left parietofrontal region, consistent with evacuation of recent prior lobar hemorrhage. Chronic small vessel ischemic changes were also noted., evolving subacute infarct of left cerebral hemisphere . She was transferred to [facility] hospital to Neuro ICU . PCCM consulted . She remains confused not following commands. She is awake and moving left side w/ right sided weakness with non purposeful movements. O2 sats are good at 97% on room air. Does have tachypnea but appears comfortable w/ no increased wob. She is going to MRI later today    SUBJECTIVE:  No  significant change in mental status , awake and alert, not following commands.  BC panel + serratia   VITAL SIGNS: BP (!) 141/89   Pulse (!) 115   Temp 100.1 F (37.8 C) (Axillary)   Resp (!) 22   Ht 5\' 3"  (1.6 m)   Wt 129 lb 6.6 oz (58.7 kg)   SpO2 100%   BMI 22.92 kg/m   HEMODYNAMICS:    VENTILATOR SETTINGS: Vent Mode: PSV;CPAP FiO2 (%):  [30 %-100 %] 30 % Set Rate:  [14 bmp-30 bmp] 30 bmp Vt Set:  [440 mL] 440 mL PEEP:  [5 cmH20] 5 cmH20 Pressure Support:  [10 cmH20] 10 cmH20 Plateau Pressure:  [14 cmH20-16 cmH20] 14 cmH20  INTAKE / OUTPUT: I/O last 3 completed shifts: In: 4069 [I.V.:2406.5; NG/GT:30; IV Piggyback:1632.5] Out: 2145 [Urine:2145]  PHYSICAL EXAMINATION: General:  Awake and alert ,not tracking  Neuro:  Alert, moving left side ,  not following commands, nonverbal ,  HEENT:  Oral mucosa dry , PERRL , Cardiovascular:  RRR  Lungs:  Decreased BS in bases w/ no wheezing  Abdomen:  Soft , NT ,  Hypoactive BS  Musculoskeletal:  Moving left side , right sided weakness  Skin: scalp incision w/ crusting noted.   LABS:  BMET  Recent Labs Lab 09/02/16 0115 09/02/16 1400 09/03/16 0302  NA 140 145 149*  K 3.0* 3.1* 2.8*  CL 104 108 113*  CO2 23 25 24   BUN 10 12 13   CREATININE 0.61 0.64 0.64  GLUCOSE 136* 119* 126*    Electrolytes  Recent Labs Lab 09/02/16 0115 09/02/16 1400  09/03/16 0302  CALCIUM 9.1 9.2 8.6*    CBC  Recent Labs Lab 09/02/16 0115 09/02/16 1400 09/03/16 0302  WBC 17.6* 14.8* 13.0*  HGB 14.4 15.3* 13.8  HCT 41.1 44.1 40.2  PLT 391 359 325    Coag's  Recent Labs Lab 08/31/16 2149 09/05/2016 0322  APTT 29 29  INR 1.02 1.20    Sepsis Markers  Recent Labs Lab 08/31/16 2211 09/10/2016 0700  LATICACIDVEN 2.5* 1.2    ABG  Recent Labs Lab 09/02/2016 1534 09/02/16 1530  PHART 7.506* 7.525*  PCO2ART 32.3 28.7*  PO2ART 76.0* 314*    Liver Enzymes  Recent Labs Lab 08/31/16 2149 09/13/2016 0322  09/02/16 1400  AST 22 18 25   ALT 26 21 22   ALKPHOS 197* 170* 185*  BILITOT 0.7 0.8 0.6  ALBUMIN 3.5 2.8* 2.4*    Cardiac Enzymes  Recent Labs Lab 08/31/16 2149  TROPONINI <0.03    Glucose  Recent Labs Lab 09/02/16 1117 09/02/16 1520 09/02/16 1933 09/02/16 2312 09/03/16 0314 09/03/16 0856  GLUCAP 96 120* 150* 126* 123* 112*    Imaging Dg Chest Port 1 View  Result Date: 09/02/2016 CLINICAL DATA:  Hypoxia EXAM: PORTABLE CHEST 1 VIEW COMPARISON:  August 31, 2016 FINDINGS: Endotracheal tube tip is 4.1 cm above the carina. Feeding tube tip is below the diaphragm. No pneumothorax. There is no edema or consolidation. Heart size and pulmonary vascularity are within normal limits. No adenopathy. There is atherosclerotic calcification in the aorta. There are calcified breast implants bilaterally. IMPRESSION: Tube positions as described without pneumothorax. No edema or consolidation. There is aortic atherosclerosis. Electronically Signed   By: Lowella Grip III M.D.   On: 09/02/2016 14:28     STUDIES:  CT head 11/17>Likely subarachnoid hemorrhage at the left convexity, and also at the right occipital lobe. Packing material at the left convexity,reflecting recent evacuation of a hematoma. Evolving subacute infarct at the left cerebral hemisphere, primarily involving the white matter. CT head 11/18 >>no changes  MRI 11/18 >>. Subacute parenchymal hematoma in the left cerebral hemisphere, the largest is along the left frontal parietal junction has edema,local mass effect, and measures up to 7 cm.2. Subacute hemorrhage or less likely debris in infratentorial cisterns and lateral ventricles. Mild lateral ventricularhydrocephalus when compared to 2016 comparison.. Periventricular T2 hyperintensity is likely a combination of CSFre- absorption and the chronic microvascular disease that was seenin 2016.4. Superficial siderosis around the cerebral convexities, progressedfrom 2016  EEG  11/18>>no seizure, global encephalopathic, mod to severe diffuse slowing.    CULTURES: BC 08/31/16> > serratia UC 11/17 >> E coli CSF culture 11/19 > GNR  ANTIBIOTICS: Merrem 11/18 >> Flagyl 11/19  linazolid 11/17>>>  SIGNIFICANT EVENTS: 11/71/7 >Transfer from Rehab to [place] ER>/[facility] Neuro ICU for 2 new SAH   LINES/TUBES:  DISCUSSION: 65 yo with recent SAH s/p craniotomy and clot evacuation admitted with recurrent SAH .BC panel with + serration. Broad Spectrum abx added . LP is consistent with CNS infection.   ASSESSMENT / PLAN:  PULMONARY A: Tachypnea ?secondary neuro / infectious etiology  Intubated for airway protection P:   Keep O2 sats >90%  Follow CXR and ABG  CARDIOVASCULAR A:  HTN permissive (ok <220/120 but grad. Normalize in 5-7 d per neuro)   P:  Labetalol IV As needed   Hold norvasc /cozaar for now   RENAL A:   Hypokalemia  P:   Replace K + Continue IVF NS at 75cc /hr   GASTROINTESTINAL A:  P:   Add TF  PPI   HEMATOLOGIC A:   No active issues  P:  Trend cbc   INFECTIOUS A:   Bacteremia -Serratia on BC panel  ? Brain abscess source  GNR in CSF  P:   Trend Temp /WBC  Follow cultures Appreciate help from ID   ENDOCRINE A:   Hypothyroid -tsh ok  Hyperglycemia  P:   Continue Synthroid  SSI   NEUROLOGIC A:   Recurrent SAH -s/p craniotomy w/ clot evacuation ~08/15/16 with recurrent SAH x 2 08/31/16  EEG no seizure   P:   Monitor mental status closely  Neuro/NS following  Avoid sedating rx  Cont on depacon   FAMILY  - Updates: family updated on bedside 11/20 - Inter-disciplinary family meet or Palliative Care meeting due by:  09/08/16  Critical care time- 45 mins  Marshell Garfinkel MD [place] [facility] Pager [phone] If no answer or after 3pm call: [phone] 09/03/2016, 10:35 AM

## 2016-09-03 NOTE — Progress Notes (Signed)
STROKE TEAM PROGRESS NOTE   SUBJECTIVE (INTERVAL HISTORY) Daughter and husband are at the bedside. The patient remains nonverbal. Had LP yesterday, consistent with bacterial meningitis. On meropenum and zyvox now. Tmax 100.1 and WBC trending down. Still intubated. Able to open eyes on voice and blinking to visual threat but not following commands.    OBJECTIVE Temp:  [98.4 F (36.9 C)-100.1 F (37.8 C)] 100.1 F (37.8 C) (11/20 0800) Pulse Rate:  [108-129] 115 (11/20 1000) Cardiac Rhythm: Sinus tachycardia (11/20 0800) Resp:  [15-43] 22 (11/20 1000) BP: (125-160)/(68-99) 141/89 (11/20 0900) SpO2:  [98 %-100 %] 100 % (11/20 1000) FiO2 (%):  [30 %-100 %] 30 % (11/20 0846)  CBC:   Recent Labs Lab 08/31/16 2149  09/02/16 1400 09/03/16 0302  WBC 22.7*  < > 14.8* 13.0*  NEUTROABS 20.6*  --  13.1*  --   HGB 15.7  < > 15.3* 13.8  HCT 45.3  < > 44.1 40.2  MCV 94.1  < > 94.6 93.9  PLT 471*  < > 359 325  < > = values in this interval not displayed.  Basic Metabolic Panel:   Recent Labs Lab 09/02/16 1400 09/03/16 0302  NA 145 149*  K 3.1* 2.8*  CL 108 113*  CO2 25 24  GLUCOSE 119* 126*  BUN 12 13  CREATININE 0.64 0.64  CALCIUM 9.2 8.6*    Lipid Panel:     Component Value Date/Time   CHOL 105 09/02/2016 0115   TRIG 67 09/02/2016 1400   HDL 64 09/02/2016 0115   CHOLHDL 1.6 09/02/2016 0115   VLDL 10 09/02/2016 0115   LDLCALC 31 09/02/2016 0115   HgbA1c:  Lab Results  Component Value Date   HGBA1C 5.7 (H) 09/02/2016   Urine Drug Screen:     Component Value Date/Time   LABOPIA POSITIVE (A) 08/25/2016 0958   COCAINSCRNUR NONE DETECTED 09/12/2016 0958   COCAINSCRNUR NONE DETECTED 05/11/2015 2037   LABBENZ NONE DETECTED 08/17/2016 0958   AMPHETMU NONE DETECTED 09/10/2016 0958   THCU NONE DETECTED 09/08/2016 0958   LABBARB NONE DETECTED 09/05/2016 0958      IMAGING I have personally reviewed the radiological images below and agree with the radiology  interpretations.  Ct Head Wo Contrast 09/12/2016 1. Unchanged small amounts of superior parasagittal left convexity and right occipital subarachnoid blood.  2. No new areas of hemorrhage.  3. Unchanged appearance of left parietal packing material. No midline shift, new mass effect or hydrocephalus.  4. Expected evolution of ischemia within the left hemisphere lower lobe predominantly involving the white matter and extending to the left frontal cortex.   08/31/2016 1. Likely subarachnoid hemorrhage at the left convexity, and also at the right occipital lobe. Packing material at the left convexity, reflecting recent evacuation of a hematoma.  2. Evolving subacute infarct at the left cerebral hemisphere, primarily involving the white matter.  3. Chronic encephalomalacia at the white matter of the right frontal lobe, reflecting remote infarct. Scattered small vessel ischemic microangiopathy.  4. Mild partial opacification of the right mastoid air cells.   Mr [name] Wo Contrast 09/02/2016   IMPRESSION: 1. Subacute parenchymal hematoma in the left cerebral hemisphere, the largest is along the left frontal parietal junction has edema, local mass effect, and measures up to 7 cm. 2. Subacute hemorrhage or less likely debris in infratentorial cisterns and lateral ventricles. Mild lateral ventricular hydrocephalus when compared to 2016 comparison. 3. Periventricular T2 hyperintensity is likely a combination of CSF re-  absorption and the chronic microvascular disease that was seen in 2016. 4. Superficial siderosis around the cerebral convexities, progressed from 2016. Is there known bleeding diastasis or vascular lesion?  ADDENDUM: Case discussed with Dr. Leonie Man today, patient has positive blood culture and purulence on LP today. Additionally, the patient's surgery was noted to be ~4 weeks ago. 1. The larger of 2 left cerebral hematomas is likely superinfected, especially given history of surgery 1 month ago -  which makes the vasogenic edema unexpected. 2cm area of presumably hemostatic material in this cavity is also more suspicious in this setting, and may be a nidus of infection. Will be glad to correlate with operative report when it become available. 2. Although there is a suprising lack of ependymal and leptomeningeal enhancement for ventriculitis/meningitis, CSF results implies the intraventricular and subarachnoid material is infectious debris. Lateral ventriculomegaly as previously noted.   EEG - This is an abnormal EEG due to moderate to severe diffuse slowing. This is consistent with a global encephalopathic process, nonspecific as to etiology. The greater focal slowing in the posterior L hemisphere is consistent with known hemorrhage in that area. There is no evidence of seizure or epileptiform activity to suggest propensity for seizure on this recording.    PHYSICAL EXAM  Temp:  [98.4 F (36.9 C)-100.1 F (37.8 C)] 100.1 F (37.8 C) (11/20 0800) Pulse Rate:  [108-129] 115 (11/20 1000) Resp:  [15-43] 22 (11/20 1000) BP: (125-160)/(68-99) 141/89 (11/20 0900) SpO2:  [98 %-100 %] 100 % (11/20 1000) FiO2 (%):  [30 %-100 %] 30 % (11/20 0846)  General - Well nourished, well developed, intubated and off propofol.  Ophthalmologic - Fundi not visualized due to noncooperation.  Cardiovascular - Regular rhythm, but still tachycardia.  Neuro - intubated and off propofol, on voice pt is able to open eyes and blinking to visual threat bilaterally, however, not following commands. Eye has left preference and doll's eye present, PERRL. Positive corneal and gag. Currently on weaning protocol on vent. On pain stimulation, RUE 0/5, RLE mild withdraw, LLE 2+/5 on withdraw, and LUE spontaneous movement. DTR 1+ and no babinski. Sensation, coordination and gait not tested. Nuchal rigidity not tested due to intubation.     ASSESSMENT/PLAN Sydney Coleman is a 65 y.o. female with history of previous TIA,  previous stroke, previous subarachnoid hemorrhage, hypertension, hypothyroidism, hyperlipidemia, COPD, chronic headaches, and anxiety presenting with nausea, vomiting, left gaze preference, altered mental status, and no longer speaking.  She did not receive IV t-PA due to new subarachnoid hemorrhages.  Bacterial meningitis / encephalitis - hematogenous source from bacteremia vs. Local process due to recent surgical procedure. However, due to clean wound at scalp, the former is more likely  Resultant - fever, chill, leukocytosis  Intubated for airway protection -  CCM on board  LP consistent with bacterial meningitis  BCx positive for serratia marcescens  WBC trending down  Tmax 100.1  ID on board  On meropenem and zyvox  Pending blood culture sensitivity  Continue keppar for seizure control  Left pareital hemorrhage s/p craniotomy 4 weeks ago   Resultant  aphasia and dense right hemiplegia  CT head - questionable for small SAH x2 - left medial frontal and right inferior parietal  MRI  - concerning for superinfection at above sites  EEG - severe slowing and no seizure  UDS - positive for opiates  LDL - 31  HgbA1c -  5.7  VTE prophylaxis - SCDs Diet NPO time specified  aspirin 81  mg daily prior to admission, now on No antithrombotic due to concerns of SAH  Ongoing aggressive stroke risk factor management  Therapy recommendations: pending  Disposition: Pending  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 Krista Blue in clinic.  Unclear etiology at that time  Hypertension  Stable  BP goal < 160  Hyperlipidemia  Home meds:  Pravachol 20 mg daily prior to admission  LDL 31  Resume statin on discharge  Tobacco abuse  Current smoker  Smoking cessation counseling will be provided  Other Stroke Risk Factors  Advanced age  Hx stroke/TIA  Family hx stroke (mother)  Other Active Problems  Hypokalemia - 3.4 -> 3.0  (supplemented)  Leukocytosis - 22.7 -> 19.6 -> 17.6 ->14.8 -> 13.0  Hx of elevated serum homocysteine in 2016 - repeat homocysteine level   Hospital day # 2  This patient is critically ill due to bacterial meningitis, recent left ICH s/p craniectomy, UTI and bacteremia, seizure and at significant risk of neurological worsening, death form sepsis, vasospasm, status epilepticus, recurrent brain bleed. This patient's care requires constant monitoring of vital signs, hemodynamics, respiratory and cardiac monitoring, review of multiple databases, neurological assessment, discussion with family, other specialists and medical decision making of high complexity. I spent 40 minutes of neurocritical care time in the care of this patient.  Rosalin Hawking, MD PhD Stroke Neurology 09/03/2016 3:19 PM     To contact Stroke Continuity provider, please refer to [email or web address] After hours, contact General Neurology

## 2016-09-03 NOTE — Progress Notes (Signed)
Patient with episodes of left upper arm shaking/temors throughout the day. Each episode has lasted for a few minutes at a time and will stop. MD paged. No other neuro changes. Evangelina Delancey, Rande Brunt, RN

## 2016-09-03 NOTE — Consult Note (Signed)
[place] Nurse wound consult note Reason for Consult: left craniotomy site pus/weeping Patient intubated, family at bedside Wound type: surgical  Measurement: closed surgical incision; aprox. 7cm  Wound [id number]  Drainage (amount, consistency, odor) dried, no oozing noted, no weeping noted Periwound:head is shaven at site Dressing procedure/placement/frequency: No topical wound care needed.  Discussed POC with family and bedside nurse.  Re consult if needed, will not follow at this time. Thanks  Synia Douglass [employer], RN,CWOCN, CNS [phone])

## 2016-09-03 NOTE — Progress Notes (Signed)
OT Cancellation Note  Patient Details Name: Sydney Coleman MRN: [id number] DOB: Sep 22, 1951   Cancelled Treatment:    Reason Eval/Treat Not Completed: Patient not medically ready.   Parke Poisson B 09/03/2016, 11:33 AM

## 2016-09-03 NOTE — Progress Notes (Signed)
Initial Nutrition Assessment  INTERVENTION:   Vital AF 1.2 @ 50 ml/hr (1200 ml/day) via Cortrak Provides: 1440 kcal, 90 grams protein, and 973 ml H2O.    NUTRITION DIAGNOSIS:   Inadequate oral intake related to inability to eat as evidenced by NPO status.  GOAL:   Patient will meet greater than or equal to 90% of their needs  MONITOR:   TF tolerance, I & O's, Vent status  REASON FOR ASSESSMENT:   Consult Enteral/tube feeding initiation and management  ASSESSMENT:   Pt with hx of hemorrhagic stroke 11/1 s/p crani with clot evacuation in [facility] who had been at [facility] (SNF) for rehab. Admitted with altered mental status and per CT has two new subarachnoid hemorrhages and CNS infection.    Husband and daughter at bedside. They report that pt had not ate well after her crani 11/1 due to requiring pureed diet with thickened liquids but had started tolerating a regular diet and was eating better. They do not feel that she has lot any weight.   11/18 Cortrak placed, tip at LOT Pt discussed during ICU rounds and with RN.   Patient is currently intubated on ventilator support MV: 8.2 L/min Temp (24hrs), Avg:99.7 F (37.6 C), Min:98.4 F (36.9 C), Max:101.1 F (38.4 C)  Medications reviewed and include: senokot-s, vitamin b 12 Labs reviewed: Na 149, K+ 2.8 CBG's: 126-123-112 Nutrition-Focused physical exam completed. Findings are no fat depletion, mild quadricep muscle depletion, and no edema.  Depletion could be from decreased mobility during most recent illness.    Diet Order:  Diet NPO time specified  Skin:  Reviewed, no issues (right head and right knee incisions)  Last BM:  unknown  Height:   Ht Readings from Last 1 Encounters:  08/28/2016 5\' 3"  (1.6 m)    Weight:   Wt Readings from Last 1 Encounters:  09/02/16 129 lb 6.6 oz (58.7 kg)    Ideal Body Weight:  52.2 kg  BMI:  Body mass index is 22.92 kg/m.  Estimated Nutritional Needs:   Kcal:   1411  Protein:  75-90 grams  Fluid:  >1.5 L/day  EDUCATION NEEDS:   No education needs identified at this time  [place], [place], [place] Pager [phone] After Hours Pager

## 2016-09-03 NOTE — Progress Notes (Signed)
[facility] for Infectious Disease    Date of Admission:  08/28/2016   Total days of antibiotics 4        Day 2 linezolid        Day 3 meropenem    ID: Sydney Coleman is a 65 y.o. female with PMH of hemorrhagic stroke 17 years ago with craniotomy and clot evacuation and recent craniotomy with left parietal hemorrhage 4 weeks ago who came in with AMS and bilateral SAH.  LP c/w with CNS infection and has serratia bloodstream infection  Active Problems:   Subarachnoid (nontraumatic) hemorrhage of newborn   Subarachnoid hemorrhage (HCC)   Superficial siderosis of central nervous system   Bacteremia    Subjective: Fever of 101F this morning. RN reports intermittent left upper extremity shaking ? Unclear if seizure activity  24hr event: gram stain showing GNR on csf  Medications:  . chlorhexidine gluconate (MEDLINE KIT)  15 mL Mouth Rinse BID  . insulin aspart  2-6 Units Subcutaneous Q4H  . levETIRAcetam  1,000 mg Intravenous Q12H  . levothyroxine  25 mcg Per Tube QAC breakfast  . linezolid (ZYVOX) IV  600 mg Intravenous Q12H  . mouth rinse  15 mL Mouth Rinse 10 times per day  . meropenem (MERREM) IV  2 g Intravenous Q8H  . pantoprazole (PROTONIX) IV  40 mg Intravenous QHS  . senna-docusate  1 tablet Oral BID  . cyanocobalamin  100 mcg Oral Daily    Objective: Vital signs in last 24 hours: Temp:  [98.4 F (36.9 C)-101.1 F (38.4 C)] 100 F (37.8 C) (11/20 1600) Pulse Rate:  [110-124] 114 (11/20 1800) Resp:  [15-32] 32 (11/20 1800) BP: (106-156)/(68-90) 131/76 (11/20 1800) SpO2:  [99 %-100 %] 100 % (11/20 1800) FiO2 (%):  [30 %-40 %] 30 % (11/20 1633)  Physical Exam  Constitutional:  Intubated and sedated. appears well-developed and well-nourished. No distress.  HENT: [place]/AT, PERRLA, no scleral icterus Mouth/Throat: OETT in place Cardiovascular: Normal rate, regular rhythm and normal heart sounds. Exam reveals no gallop and no friction rub.  No murmur heard.    Pulmonary/Chest: bilateral rhonchi. No respiratory distress.  has no wheezes.  Abdominal: Soft. Bowel sounds are decreased.  exhibits no distension. There is no tenderness.  Lymphadenopathy: no cervical adenopathy. No axillary adenopathy Skin: Skin is warm and dry. No rash noted. No erythema.   Lab Results  Recent Labs  09/02/16 1400 09/03/16 0302  WBC 14.8* 13.0*  HGB 15.3* 13.8  HCT 44.1 40.2  NA 145 149*  K 3.1* 2.8*  CL 108 113*  CO2 25 24  BUN 12 13  CREATININE 0.64 0.64   Liver Panel  Recent Labs  08/30/2016 0322 09/02/16 1400  PROT 7.8 7.8  ALBUMIN 2.8* 2.4*  AST 18 25  ALT 21 22  ALKPHOS 170* 185*  BILITOT 0.8 0.6    Microbiology: 11/19 blood cx ngtd 11/19 csf has GNR on gram stain 11/17 blood cx x 2 serrratia 11/17 ur cx ecoli    Studies/Results: Dg Chest Port 1 View  Result Date: 09/02/2016 CLINICAL DATA:  Hypoxia EXAM: PORTABLE CHEST 1 VIEW COMPARISON:  August 31, 2016 FINDINGS: Endotracheal tube tip is 4.1 cm above the carina. Feeding tube tip is below the diaphragm. No pneumothorax. There is no edema or consolidation. Heart size and pulmonary vascularity are within normal limits. No adenopathy. There is atherosclerotic calcification in the aorta. There are calcified breast implants bilaterally. IMPRESSION: Tube positions as described without pneumothorax.  No edema or consolidation. There is aortic atherosclerosis. Electronically Signed   By: Lowella Grip III M.D.   On: 09/02/2016 14:28     Assessment/Plan: 89SM F with recurrent SAH s/p craniotomy with clot evacuation on 08/15/16 now with recurrent SAH x 2 on 11/17, but now infected hematoma c/w CNS infection and serratia bacteremia  Serratia bacteremia = continue with meropenem  CNS infected SAH = GNR on gram stain but identification still pending, continue with meropenem and linezolid until CSF culture finalizes  Seizure = continue on keppra. Followed by neurology  Baxter Flattery  [facility] for Infectious Diseases Cell: [phone] Pager: [phone]  09/03/2016, 6:15 PM

## 2016-09-04 ENCOUNTER — Telehealth: Payer: Self-pay

## 2016-09-04 ENCOUNTER — Inpatient Hospital Stay (HOSPITAL_COMMUNITY): Payer: Medicare Other

## 2016-09-04 DIAGNOSIS — F17200 Nicotine dependence, unspecified, uncomplicated: Secondary | ICD-10-CM

## 2016-09-04 LAB — BLOOD GAS, ARTERIAL
ACID-BASE EXCESS: 3.5 mmol/L — AB (ref 0.0–2.0)
Bicarbonate: 26.4 mmol/L (ref 20.0–28.0)
Drawn by: 270271
FIO2: 30
O2 Saturation: 97.9 %
PEEP/CPAP: 5 cmH2O
PH ART: 7.496 — AB (ref 7.350–7.450)
Patient temperature: 101.6
VT: 440 mL
pCO2 arterial: 35.1 mmHg (ref 32.0–48.0)
pO2, Arterial: 114 mmHg — ABNORMAL HIGH (ref 83.0–108.0)

## 2016-09-04 LAB — GLUCOSE, CAPILLARY
GLUCOSE-CAPILLARY: 142 mg/dL — AB (ref 65–99)
GLUCOSE-CAPILLARY: 107 mg/dL — AB (ref 65–99)
Glucose-Capillary: 155 mg/dL — ABNORMAL HIGH (ref 65–99)
Glucose-Capillary: 160 mg/dL — ABNORMAL HIGH (ref 65–99)
Glucose-Capillary: 183 mg/dL — ABNORMAL HIGH (ref 65–99)
Glucose-Capillary: 195 mg/dL — ABNORMAL HIGH (ref 65–99)

## 2016-09-04 LAB — HERPES SIMPLEX VIRUS(HSV) DNA BY PCR
HSV 1 DNA: NEGATIVE
HSV 2 DNA: NEGATIVE

## 2016-09-04 LAB — PHOSPHORUS
PHOSPHORUS: 1.5 mg/dL — AB (ref 2.5–4.6)
Phosphorus: 2.5 mg/dL (ref 2.5–4.6)

## 2016-09-04 LAB — CBC
HCT: 37.6 % (ref 36.0–46.0)
Hemoglobin: 12.4 g/dL (ref 12.0–15.0)
MCH: 31.8 pg (ref 26.0–34.0)
MCHC: 33 g/dL (ref 30.0–36.0)
MCV: 96.4 fL (ref 78.0–100.0)
PLATELETS: 292 10*3/uL (ref 150–400)
RBC: 3.9 MIL/uL (ref 3.87–5.11)
RDW: 13.2 % (ref 11.5–15.5)
WBC: 10.2 10*3/uL (ref 4.0–10.5)

## 2016-09-04 LAB — CULTURE, BLOOD (ROUTINE X 2)

## 2016-09-04 LAB — BASIC METABOLIC PANEL
ANION GAP: 9 (ref 5–15)
BUN: 17 mg/dL (ref 6–20)
CO2: 24 mmol/L (ref 22–32)
Calcium: 8.2 mg/dL — ABNORMAL LOW (ref 8.9–10.3)
Chloride: 120 mmol/L — ABNORMAL HIGH (ref 101–111)
Creatinine, Ser: 0.62 mg/dL (ref 0.44–1.00)
GLUCOSE: 182 mg/dL — AB (ref 65–99)
POTASSIUM: 4 mmol/L (ref 3.5–5.1)
SODIUM: 153 mmol/L — AB (ref 135–145)

## 2016-09-04 LAB — URINE CULTURE: Culture: >=100000 — AB

## 2016-09-04 LAB — MAGNESIUM
MAGNESIUM: 2.1 mg/dL (ref 1.7–2.4)
MAGNESIUM: 2 mg/dL (ref 1.7–2.4)

## 2016-09-04 MED ORDER — POTASSIUM PHOSPHATES 15 MMOLE/5ML IV SOLN
10.0000 meq | Freq: Once | INTRAVENOUS | Status: AC
  Administered 2016-09-04: 10 meq via INTRAVENOUS
  Filled 2016-09-04: qty 2.27

## 2016-09-04 MED ORDER — ASPIRIN EC 81 MG PO TBEC
81.0000 mg | DELAYED_RELEASE_TABLET | Freq: Every day | ORAL | Status: DC
  Administered 2016-09-04 – 2016-09-05 (×2): 81 mg via ORAL
  Filled 2016-09-04 (×2): qty 1

## 2016-09-04 MED ORDER — HEPARIN SODIUM (PORCINE) 5000 UNIT/ML IJ SOLN
5000.0000 [IU] | Freq: Three times a day (TID) | INTRAMUSCULAR | Status: DC
  Administered 2016-09-04 – 2016-09-12 (×23): 5000 [IU] via SUBCUTANEOUS
  Filled 2016-09-04 (×23): qty 1

## 2016-09-04 NOTE — Progress Notes (Signed)
PT Cancellation Note  Patient Details Name: Sydney Coleman MRN: [id number] DOB: [birth year]   Cancelled Treatment:    Reason Eval/Treat Not Completed: Patient not medically ready Pt's medical status still not improving. Will sign off at this time. Please re-consult if there is a change in status and PT becomes appropriate.   Marguarite Arbour A Dnasia Gauna 09/04/2016, 12:54 PM Wray Kearns, [place], DPT [phone]

## 2016-09-04 NOTE — Progress Notes (Signed)
EEG Completed; Results Pending  

## 2016-09-04 NOTE — Procedures (Signed)
ELECTROENCEPHALOGRAM REPORT  Date of Study: 09/04/2016  Patient's Name: Sydney Coleman MRN: [id number] Date of Birth: 12-Apr-1951  Referring Provider: Dr. Rosalin Hawking  Clinical History: This is a 65 year old woman with recent left parietal hemorrhage admitted with altered mental status and bilateral subarachnoid hemorrhage.  Medications: levETIRAcetam (KEPPRA) 1,000 mg in sodium chloride 0.9 % 100 mL IVPB  acetaminophen (TYLENOL) tablet 650 mg  insulin aspart (novoLOG) injection 2-6 Units  labetalol (NORMODYNE,TRANDATE) injection 10 mg  levothyroxine (SYNTHROID, LEVOTHROID) tablet 25 mcg  linezolid (ZYVOX) IVPB 600 mg  meropenem (MERREM) 2 g in sodium chloride 0.9 % 100 mL IVPB  ondansetron (ZOFRAN) tablet 4 mg  pantoprazole (PROTONIX) injection 40 mg  senna-docusate (Senokot-S) tablet 1 tablet  vitamin B-12 (CYANOCOBALAMIN) tablet 100 mcg   Technical Summary: A multichannel digital EEG recording measured by the international 10-20 system with electrodes applied with paste and impedances below 5000 ohms performed as portable with EKG monitoring in an intubated and unresponsive patient. No sedating medications listed. Hyperventilation and photic stimulation were not performed.  The digital EEG was referentially recorded, reformatted, and digitally filtered in a variety of bipolar and referential montages for optimal display.   Description: The patient is intubated and unresponsive during the recording. There is no clear posterior dominant rhythm. The background consists of a large amount of diffuse 4-6 Hz theta and occasional diffuse 2-3 Hz delta slowing. Normal sleep architecture is not seen. Hyperventilation and photic stimulation were not performed.  There were no epileptiform discharges or electrographic seizures seen.    EKG lead was unremarkable.  Impression: This EEG is abnormal due to moderate diffuse slowing of the background.  Clinical Correlation of the above findings  indicates diffuse cerebral dysfunction that is non-specific in etiology and can be seen with hypoxic/ischemic injury, toxic/metabolic encephalopathies, or medication effect.  There were no electrographic seizures seen in this study. Clinical correlation is advised.   Ellouise Newer, M.D.

## 2016-09-04 NOTE — Progress Notes (Signed)
SLP Cancellation Note  Patient Details Name: Sydney Coleman MRN: [id number] DOB: 1951-08-13   Cancelled treatment:       Reason Eval/Treat Not Completed: Patient not medically ready. RN confirmed. Will f/u as able.  Ezekiel Slocumb, Student SLP  [name] 09/04/2016, 3:03 PM

## 2016-09-04 NOTE — Telephone Encounter (Signed)
Pt change from Dr. Krista Blue to Dr. Leonie Man. Both doctors are aware of change see past phone notes.

## 2016-09-04 NOTE — Consult Note (Signed)
PULMONARY / CRITICAL CARE MEDICINE   Name: Sydney Coleman MRN: [id number] DOB: 27-Feb-1951    ADMISSION DATE:  09/03/2016 CONSULTATION DATE:  08/28/2016  REFERRING MD:  Neuro /Dr. Cheral Marker   CHIEF COMPLAINT:  SAH   SUMMARY:   65 y/o F admitted to Neuro ICU on 08/20/2016 with new subarachnoid hemorrhages. 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 She was brought in to ER for gradually worsening mental status that started morning of 08/31/16 . She was at [facility] for rehabilitation and her family reported that her baseline mental status was alert and oriented times three, conversant and able to eat without difficulty. They reported that the patient started complaining of nausea and vomiting. Reportedly she was starting to act unusual and be less responsive at 8 AM on 11/17/17including some left gaze preference but not consistently so. Later in day on 08/31/16 she became nonverbal and acutely ill. She was transported by EMS for further evaluation.   She was taken to [facility] ER where CT head revealed two new subarachnoid hemorrhages, one involving sulci of the medial left frontal lobe and one involving sulci along the right occipital lobe. Also noted on OSH CT was recent craniotomy defect with encephalomalacia in the adjacent left parietofrontal region, consistent with evacuation of recent prior lobar hemorrhage. Chronic small vessel ischemic changes were also noted, evolving subacute infarct of left cerebral hemisphere . She was transferred to [facility] hospital to Neuro ICU.  PCCM consulted.   SUBJECTIVE:  Husband reports pt not waking.  Currently weaning on PSV.    VITAL SIGNS: BP 124/77   Pulse (!) 108   Temp 99.5 F (37.5 C) (Axillary)   Resp 15   Ht 5\' 3"  (1.6 m)   Wt 133 lb 9.6 oz (60.6 kg)   SpO2 100%   BMI 23.67 kg/m   HEMODYNAMICS:    VENTILATOR SETTINGS: Vent Mode: PSV;CPAP FiO2 (%):  [30 %] 30  % Set Rate:  [14 bmp] 14 bmp Vt Set:  [440 mL] 440 mL PEEP:  [5 cmH20] 5 cmH20 Pressure Support:  [8 cmH20] 8 cmH20 Plateau Pressure:  [15 cmH20-19 cmH20] 19 cmH20  INTAKE / OUTPUT: I/O last 3 completed shifts: In: [id number] [I.V.:2272.5; NG/GT:822.5; IV Piggyback:1330] Out: 3765 [Urine:3765]  PHYSICAL EXAMINATION: General:  Critically ill appearing female in NAD on vent Neuro:  Eyes closed, pupils 3-21mm R, moves BLE with stimulation, R sided weakness HEENT:  Oral mucosa dry, ETT  Cardiovascular:  RRR  Lungs:  Decreased BS in bases w/ no wheezing  Abdomen:  Soft, NT,  Hypoactive BS  Musculoskeletal:  No acute deformities Skin: scalp incision w/ crusting noted.   LABS:  BMET  Recent Labs Lab 09/02/16 1400 09/03/16 0302 09/04/16 0506  NA 145 149* 153*  K 3.1* 2.8* 4.0  CL 108 113* 120*  CO2 25 24 24   BUN 12 13 17   CREATININE 0.64 0.64 0.62  GLUCOSE 119* 126* 182*    Electrolytes  Recent Labs Lab 09/02/16 1400 09/03/16 0302 09/03/16 1849 09/04/16 0506  CALCIUM 9.2 8.6*  --  8.2*  MG  --   --  2.0 2.1  PHOS  --   --  1.6* 1.5*    CBC  Recent Labs Lab 09/02/16 1400 09/03/16 0302 09/04/16 0506  WBC 14.8* 13.0* 10.2  HGB 15.3* 13.8 12.4  HCT 44.1 40.2 37.6  PLT 359 325 292    Coag's  Recent Labs Lab 08/31/16  2149 09/10/2016 0322  APTT 29 29  INR 1.02 1.20    Sepsis Markers  Recent Labs Lab 08/31/16 2211 08/30/2016 0700  LATICACIDVEN 2.5* 1.2    ABG  Recent Labs Lab 08/31/2016 1534 09/02/16 1530 09/04/16 0416  PHART 7.506* 7.525* 7.496*  PCO2ART 32.3 28.7* 35.1  PO2ART 76.0* 314* 114*    Liver Enzymes  Recent Labs Lab 08/31/16 2149 09/09/2016 0322 09/02/16 1400  AST 22 18 25   ALT 26 21 22   ALKPHOS 197* 170* 185*  BILITOT 0.7 0.8 0.6  ALBUMIN 3.5 2.8* 2.4*    Cardiac Enzymes  Recent Labs Lab 08/31/16 2149  TROPONINI <0.03    Glucose  Recent Labs Lab 09/03/16 1144 09/03/16 1613 09/03/16 1929 09/03/16 2313  09/04/16 0322 09/04/16 0802  GLUCAP 127* 114* 158* 153* 155* 160*    Imaging Dg Chest Port 1 View  Result Date: 09/04/2016 CLINICAL DATA:  Acute respiratory failure.  Intubated. EXAM: PORTABLE CHEST 1 VIEW COMPARISON:  09/02/2016 FINDINGS: Endotracheal tube terminates 3 cm above the carina. Enteric tube courses into the left upper abdomen with tip not imaged. Cardiomediastinal silhouette is unchanged and within normal limits. Aortic atherosclerosis is noted. There is mild biapical lung scarring. There is no evidence of acute airspace consolidation, edema, sizable pleural effusion, or pneumothorax. Calcified breast implants are noted. The IMPRESSION: 1. Support devices as above. 2. No evidence of acute airspace disease. 3. Aortic atherosclerosis. Electronically Signed   By: Logan Bores M.D.   On: 09/04/2016 07:54     STUDIES:  CT head 11/17 >> Likely subarachnoid hemorrhage at the left convexity, and also at the right occipital lobe. Packing material at the left convexity,reflecting recent evacuation of a hematoma. Evolving subacute infarct at the left cerebral hemisphere, primarily involving the white matter. CT head 11/18 >> no changes  MRI 11/18 >> Subacute parenchymal hematoma in the left cerebral hemisphere, the largest is along the left frontal parietal junction has edema, local mass effect, and measures up to 7 cm.2. Subacute hemorrhage or less likely debris in infratentorial cisterns and lateral ventricles. Mild lateral ventricularhydrocephalus when compared to 2016 comparison. Periventricular T2 hyperintensity is likely a combination of CSFre- absorption and the chronic microvascular disease that was seenin 2016.4. Superficial siderosis around the cerebral convexities, progressed from 2016  EEG 11/18 >> no seizure, global encephalopathic, mod to severe diffuse slowing.  EEG 11/21 >> no seizure, diffuse cerebral dysfunction  CULTURES: [facility] 08/31/16 >> serratia UC 11/17 >> E coli CSF  culture 11/19 >> GNR >> BCx2 11/19 >>   ANTIBIOTICS: Merrem 11/18 >> Flagyl 11/19 >> 11/19 linazolid 11/17 >>  SIGNIFICANT EVENTS: 11/17 Transfer from Rehab to [facility] ER, then to [facility] Neuro ICU for 2 new SAH   LINES/TUBES: ETT 11/19 >>  DISCUSSION: 65 yo with recent [facility] s/p craniotomy and clot evacuation admitted with recurrent SAH .BC panel with + serration. [employer] abx added . LP is consistent with CNS infection.   ASSESSMENT / PLAN:  PULMONARY A: Tachypnea ?secondary neuro / infectious etiology  Intubated for airway protection P:   PRVC 8 cc/kg  Wean PEEP / FiO2 for sats > 92% PSV wean but mental status limits extubation  Intermittent CXR   CARDIOVASCULAR A:  HTN permissive (ok <220/120 but grad. Normalize in 5-7 d per neuro)  P:  Labetalol IV PRN Hold norvasc /cozaar for now  Tele monitoring   RENAL A:   Hypokalemia  Hypophosphatemia P:   Trend BMP / UOP  Replace electrolytes as  indicated  KVO IVF  GASTROINTESTINAL A:   At Risk Malnutrition  P:   TF per nutrition PPI   HEMATOLOGIC A:   No active issues  P:  Trend CBC SCD's for DVT prophylaxis   INFECTIOUS A:   Bacteremia -Serratia on BC panel  ? Brain abscess source  GNR in CSF P:   Trend Temp /WBC  Follow cultures Appreciate help from ID Defer ABX to ID  ENDOCRINE A:   Hypothyroid -tsh ok  Hyperglycemia  P:   Continue Synthroid  SSI   NEUROLOGIC A:   Bacterial Meningitis / Encephalitis - ? Related to bacteremia vs local process with recent surgical procedure, clean scalp wound Hx Left Parietal Hemorrhage EEG no seizure  P:   Monitor mental status closely  Neuro/NS following  Avoid sedating rx  AED's per neuro  Follow EEG   FAMILY  - Updates: family updated on bedside 11/21 - Inter-disciplinary family meet or Palliative Care meeting due by:  09/08/16  CC Time: 27 minutes   Noe Gens, NP-C [facility] Pgr: [phone] or if no answer  [phone] 09/04/2016, 10:14 AM

## 2016-09-04 NOTE — Progress Notes (Signed)
STROKE TEAM PROGRESS NOTE   SUBJECTIVE (INTERVAL HISTORY) Husband is at the bedside. The patient remains nonverbal, but eyes open and tracking and blinking to visual threat. WBC normalized, tachycardia resolved and afebril overnight. As per nurse, she had 2-3 episode of left arm tremor lasting 2-3 min and resolved on its own. Increased keppra to 1000mg  bid and no more episodes. Repeat EEG this morning did not show seizure activity.     OBJECTIVE Temp:  [99.4 F (37.4 C)-101.6 F (38.7 C)] 99.5 F (37.5 C) (11/21 0800) Pulse Rate:  [96-123] 108 (11/21 0900) Cardiac Rhythm: Normal sinus rhythm (11/21 0800) Resp:  [15-32] 15 (11/21 0900) BP: (106-155)/(70-98) 124/77 (11/21 0900) SpO2:  [99 %-100 %] 100 % (11/21 0900) FiO2 (%):  [30 %] 30 % (11/21 0803) Weight:  [133 lb 9.6 oz (60.6 kg)] 133 lb 9.6 oz (60.6 kg) (11/21 0500)  CBC:   Recent Labs Lab 08/31/16 2149  09/02/16 1400 09/03/16 0302 09/04/16 0506  WBC 22.7*  < > 14.8* 13.0* 10.2  NEUTROABS 20.6*  --  13.1*  --   --   HGB 15.7  < > 15.3* 13.8 12.4  HCT 45.3  < > 44.1 40.2 37.6  MCV 94.1  < > 94.6 93.9 96.4  PLT 471*  < > 359 325 292  < > = values in this interval not displayed.  Basic Metabolic Panel:   Recent Labs Lab 09/03/16 0302 09/03/16 1849 09/04/16 0506  NA 149*  --  153*  K 2.8*  --  4.0  CL 113*  --  120*  CO2 24  --  24  GLUCOSE 126*  --  182*  BUN 13  --  17  CREATININE 0.64  --  0.62  CALCIUM 8.6*  --  8.2*  MG  --  2.0 2.1  PHOS  --  1.6* 1.5*    Lipid Panel:     Component Value Date/Time   CHOL 105 09/02/2016 0115   TRIG 67 09/02/2016 1400   HDL 64 09/02/2016 0115   CHOLHDL 1.6 09/02/2016 0115   VLDL 10 09/02/2016 0115   LDLCALC 31 09/02/2016 0115   HgbA1c:  Lab Results  Component Value Date   HGBA1C 5.7 (H) 09/02/2016   Urine Drug Screen:     Component Value Date/Time   LABOPIA POSITIVE (A) 08/20/2016 0958   COCAINSCRNUR NONE DETECTED 08/18/2016 0958   COCAINSCRNUR NONE  DETECTED 05/11/2015 2037   LABBENZ NONE DETECTED 09/03/2016 0958   AMPHETMU NONE DETECTED 09/02/2016 0958   THCU NONE DETECTED 09/02/2016 0958   LABBARB NONE DETECTED 09/05/2016 0958      IMAGING I have personally reviewed the radiological images below and agree with the radiology interpretations.  Ct Head Wo Contrast 08/15/2016 1. Unchanged small amounts of superior parasagittal left convexity and right occipital subarachnoid blood.  2. No new areas of hemorrhage.  3. Unchanged appearance of left parietal packing material. No midline shift, new mass effect or hydrocephalus.  4. Expected evolution of ischemia within the left hemisphere lower lobe predominantly involving the white matter and extending to the left frontal cortex.   08/31/2016 1. Likely subarachnoid hemorrhage at the left convexity, and also at the right occipital lobe. Packing material at the left convexity, reflecting recent evacuation of a hematoma.  2. Evolving subacute infarct at the left cerebral hemisphere, primarily involving the white matter.  3. Chronic encephalomalacia at the white matter of the right frontal lobe, reflecting remote infarct. Scattered small vessel ischemic microangiopathy.  4. Mild partial opacification of the right mastoid air cells.   Mr [name] Wo Contrast 09/02/2016   IMPRESSION: 1. Subacute parenchymal hematoma in the left cerebral hemisphere, the largest is along the left frontal parietal junction has edema, local mass effect, and measures up to 7 cm. 2. Subacute hemorrhage or less likely debris in infratentorial cisterns and lateral ventricles. Mild lateral ventricular hydrocephalus when compared to 2016 comparison. 3. Periventricular T2 hyperintensity is likely a combination of CSF re- absorption and the chronic microvascular disease that was seen in 2016. 4. Superficial siderosis around the cerebral convexities, progressed from 2016. Is there known bleeding diastasis or vascular lesion?   ADDENDUM: Case discussed with Dr. Leonie Man today, patient has positive blood culture and purulence on LP today. Additionally, the patient's surgery was noted to be ~4 weeks ago. 1. The larger of 2 left cerebral hematomas is likely superinfected, especially given history of surgery 1 month ago - which makes the vasogenic edema unexpected. 2cm area of presumably hemostatic material in this cavity is also more suspicious in this setting, and may be a nidus of infection. Will be glad to correlate with operative report when it become available. 2. Although there is a suprising lack of ependymal and leptomeningeal enhancement for ventriculitis/meningitis, CSF results implies the intraventricular and subarachnoid material is infectious debris. Lateral ventriculomegaly as previously noted.   EEG - This is an abnormal EEG due to moderate to severe diffuse slowing. This is consistent with a global encephalopathic process, nonspecific as to etiology. The greater focal slowing in the posterior L hemisphere is consistent with known hemorrhage in that area. There is no evidence of seizure or epileptiform activity to suggest propensity for seizure on this recording.   Repeat EEG 09/04/16 -  This EEG is abnormal due to moderate diffuse slowing of the background. Diffuse cerebral dysfunction that is non-specific in etiology and can be seen with hypoxic/ischemic injury, toxic/metabolic encephalopathies, or medication effect.  There were no electrographic seizures seen in this study. Clinical correlation is advised.   PHYSICAL EXAM  Temp:  [99.4 F (37.4 C)-101.6 F (38.7 C)] 99.5 F (37.5 C) (11/21 0800) Pulse Rate:  [96-123] 108 (11/21 0900) Resp:  [15-32] 15 (11/21 0900) BP: (106-155)/(70-98) 124/77 (11/21 0900) SpO2:  [99 %-100 %] 100 % (11/21 0900) FiO2 (%):  [30 %] 30 % (11/21 0803) Weight:  [133 lb 9.6 oz (60.6 kg)] 133 lb 9.6 oz (60.6 kg) (11/21 0500)  General - Well nourished, well developed, intubated and  off propofol.  Ophthalmologic - Fundi not visualized due to noncooperation.  Cardiovascular - Regular rate and rhythm.  Neuro - intubated and off propofol, eyes open, tracking to objects intermittently and blinking to visual threat bilaterally, however, not following commands. Eyes in the middle position and doll's eye present, PERRL. Positive corneal and gag. Currently on pressure support only on vent. On pain stimulation, RUE 0/5, RLE mild withdraw, LLE 2+/5 on withdraw, and LUE spontaneous movement. DTR 1+ and no babinski. Sensation, coordination and gait not tested. Nuchal rigidity not tested due to intubation.     ASSESSMENT/PLAN Ms. CATIRIA DELAGRANGE is a 65 y.o. female with history of previous TIA, previous stroke, previous subarachnoid hemorrhage, hypertension, hypothyroidism, hyperlipidemia, COPD, chronic headaches, and anxiety presenting with nausea, vomiting, left gaze preference, altered mental status, and no longer speaking.  She did not receive IV t-PA due to new subarachnoid hemorrhages.  Bacterial meningitis / encephalitis (no acute SAH this time)- hematogenous source from bacteremia vs. Local  process due to recent surgical procedure. However, due to clean wound at scalp, the former is more likely  Resultant - fever, chill, leukocytosis  Intubated for airway protection -  CCM on board  LP consistent with bacterial meningitis  BCx positive for serratia marcescens  CSF culture pending  WBC normalized now  Afebrile overnight  ID on board  On meropenem and zyvox  Pending blood culture sensitivity  ? Seizure  2-3 episodes of left UE shaking/tremor 09/03/16  EEG x 2 - diffuse slowing  Increase keppra to 1500mg  bid  Left pareital hemorrhage s/p craniotomy 4 weeks ago   Resultant  aphasia and dense right hemiplegia  CT head - questionable for small SAH x2 - left medial frontal and right inferior parietal  MRI  - concerning for superinfection at above sites  UDS  - positive for opiates  LDL - 31  HgbA1c -  5.7  VTE prophylaxis - subq heparin Diet NPO time specified  aspirin 81 mg daily prior to admission, now on ASA 81mg  daily  Ongoing aggressive stroke risk factor management  Therapy recommendations: pending  Disposition: Pending  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 Krista Blue in clinic.  Unclear etiology at that time  Hypertension  Stable BP goal < 160  Hyperlipidemia  Home meds:  Pravachol 20 mg daily prior to admission  LDL 31  Resume statin on discharge  Tobacco abuse  Current smoker  Smoking cessation counseling will be provided  Other Stroke Risk Factors  Advanced age  Hx stroke/TIA  Family hx stroke (mother)  Other Active Problems  Hypokalemia - 3.4 -> 3.0 -> 4.0  Leukocytosis - 22.7 -> 19.6 -> 17.6 ->14.8 -> 13.0->10.2  Hx of elevated serum homocysteine in 2016 - repeat homocysteine level pending   Hospital day # 3  This patient is critically ill due to bacterial meningitis, recent left ICH s/p craniectomy, UTI and bacteremia, seizure and at significant risk of neurological worsening, death form sepsis, vasospasm, status epilepticus, recurrent brain bleed. This patient's care requires constant monitoring of vital signs, hemodynamics, respiratory and cardiac monitoring, review of multiple databases, neurological assessment, discussion with family, other specialists and medical decision making of high complexity. I spent 40 minutes of neurocritical care time in the care of this patient.  Rosalin Hawking, MD PhD Stroke Neurology 09/04/2016 10:22 AM     To contact Stroke Continuity provider, please refer to [email or web address] After hours, contact General Neurology

## 2016-09-04 NOTE — Progress Notes (Signed)
Occupational Therapy Discharge Patient Details Name: Sydney Coleman MRN: [id number] DOB: July 09, 1951 Today's Date: 09/04/2016 Time:  -     Patient discharged from OT services secondary to medical decline - will need to re-order OT to resume therapy services.  Please see latest therapy progress note for current level of functioning and progress toward goals.    Progress and discharge plan discussed with patient and/or caregiver: Patient unable to participate in discharge planning and no caregivers available . Please reorder when appropriate.  GO     Vonita Moss   OTR/L Pager: [phone] Office: [phone] .  09/04/2016, 1:49 PM

## 2016-09-05 ENCOUNTER — Inpatient Hospital Stay (HOSPITAL_COMMUNITY): Payer: Medicare Other

## 2016-09-05 DIAGNOSIS — I607 Nontraumatic subarachnoid hemorrhage from unspecified intracranial artery: Secondary | ICD-10-CM

## 2016-09-05 DIAGNOSIS — E870 Hyperosmolality and hypernatremia: Secondary | ICD-10-CM

## 2016-09-05 LAB — GLUCOSE, CAPILLARY
GLUCOSE-CAPILLARY: 105 mg/dL — AB (ref 65–99)
GLUCOSE-CAPILLARY: 115 mg/dL — AB (ref 65–99)
Glucose-Capillary: 164 mg/dL — ABNORMAL HIGH (ref 65–99)
Glucose-Capillary: 128 mg/dL — ABNORMAL HIGH (ref 65–99)
Glucose-Capillary: 138 mg/dL — ABNORMAL HIGH (ref 65–99)

## 2016-09-05 LAB — BASIC METABOLIC PANEL
Anion gap: 8 (ref 5–15)
BUN: 17 mg/dL (ref 6–20)
CALCIUM: 8.1 mg/dL — AB (ref 8.9–10.3)
CO2: 28 mmol/L (ref 22–32)
CREATININE: 0.65 mg/dL (ref 0.44–1.00)
Chloride: 118 mmol/L — ABNORMAL HIGH (ref 101–111)
GFR calc non Af Amer: >60 mL/min (ref >60)
Glucose, Bld: 145 mg/dL — ABNORMAL HIGH (ref 65–99)
Potassium: 3.3 mmol/L — ABNORMAL LOW (ref 3.5–5.1)
SODIUM: 154 mmol/L — AB (ref 135–145)

## 2016-09-05 LAB — CBC
HCT: 39.8 % (ref 36.0–46.0)
Hemoglobin: 12.8 g/dL (ref 12.0–15.0)
MCH: 31.5 pg (ref 26.0–34.0)
MCHC: 32.2 g/dL (ref 30.0–36.0)
MCV: 98 fL (ref 78.0–100.0)
PLATELETS: 241 10*3/uL (ref 150–400)
RBC: 4.06 MIL/uL (ref 3.87–5.11)
RDW: 13.6 % (ref 11.5–15.5)
WBC: 11.1 10*3/uL — ABNORMAL HIGH (ref 4.0–10.5)

## 2016-09-05 LAB — CSF CULTURE W GRAM STAIN: Culture: NO GROWTH

## 2016-09-05 LAB — CSF CULTURE

## 2016-09-05 LAB — TRIGLYCERIDES: Triglycerides: 117 mg/dL (ref <150)

## 2016-09-05 LAB — HOMOCYSTEINE: Homocysteine: 8.3 umol/L (ref 0.0–15.0)

## 2016-09-05 MED ORDER — SODIUM CHLORIDE 0.9 % IV SOLN
INTRAVENOUS | Status: DC
  Administered 2016-09-06: 06:00:00 via INTRAVENOUS

## 2016-09-05 MED ORDER — DEXTROSE 5 % IV SOLN
2.0000 g | Freq: Three times a day (TID) | INTRAVENOUS | Status: DC
  Administered 2016-09-05 – 2016-09-07 (×6): 2 g via INTRAVENOUS
  Filled 2016-09-05 (×9): qty 2

## 2016-09-05 MED ORDER — POTASSIUM CHLORIDE 20 MEQ/15ML (10%) PO SOLN
40.0000 meq | Freq: Once | ORAL | Status: AC
  Administered 2016-09-05: 40 meq
  Filled 2016-09-05: qty 30

## 2016-09-05 NOTE — Progress Notes (Signed)
[facility] Progress Note Patient Name: Sydney Coleman DOB: 10-Dec-1950 MRN: [id number]   Date of Service  09/05/2016  HPI/Events of Note    eICU Interventions  Hypokalemia, repleted      Intervention Category Intermediate Interventions: Electrolyte abnormality - evaluation and management  Simonne Maffucci 09/05/2016, 6:36 AM

## 2016-09-05 NOTE — Care Management Important Message (Signed)
Important Message  Patient Details  Name: [name] MRN: [id number] Date of Birth: [birth year]   Medicare Important Message Given:  Other (see comment)    Floyd, Krishana Lutze Abena 09/05/2016, 12:39 PM

## 2016-09-05 NOTE — Progress Notes (Signed)
[facility] for Infectious Disease    Date of Admission:  09/07/2016   Total days of antibiotics 6        Day 5 meropenem        Day 3 linezolid    ID: Sydney Coleman is a 65 y.o. female  with PMH of hemorrhagic stroke 17 years ago with craniotomy and clot evacuation and recent craniotomy with left parietal hemorrhage 4 weeks ago who came in with AMS and bilateral SAH. LP c/w with CNS infection and has serratia bloodstream infection Active Problems:   Subarachnoid (nontraumatic) hemorrhage of newborn   Subarachnoid hemorrhage (HCC)   Superficial siderosis of central nervous system   Bacteremia    Subjective: tmax 100.8, still nonverbal, remains intubated  Medications:  . aspirin EC  81 mg Oral Daily  . chlorhexidine gluconate (MEDLINE KIT)  15 mL Mouth Rinse BID  . heparin subcutaneous  5,000 Units Subcutaneous Q8H  . insulin aspart  2-6 Units Subcutaneous Q4H  . levETIRAcetam  1,000 mg Intravenous Q12H  . levothyroxine  25 mcg Per Tube QAC breakfast  . mouth rinse  15 mL Mouth Rinse 10 times per day  . pantoprazole (PROTONIX) IV  40 mg Intravenous QHS  . senna-docusate  1 tablet Oral BID  . cyanocobalamin  100 mcg Oral Daily    Objective: Vital signs in last 24 hours: Temp:  [98.3 F (36.8 C)-100.8 F (38.2 C)] 98.9 F (37.2 C) (11/22 1200) Pulse Rate:  [82-122] 83 (11/22 1234) Resp:  [15-29] 15 (11/22 1234) BP: (107-172)/(57-95) 114/74 (11/22 1234) SpO2:  [9 %-100 %] 100 % (11/22 1234) FiO2 (%):  [30 %] 30 % (11/22 1234) Weight:  [134 lb 14.7 oz (61.2 kg)] 134 lb 14.7 oz (61.2 kg) (11/22 0500) Physical Exam  Constitutional:  Sleeping. Does not open eyes to verbal stimuli. appears well-developed and well-nourished. No distress.  HENT: [place]/AT, PERRLA, no scleral icterus Mouth/Throat: OETT Cardiovascular: Normal rate, regular rhythm and normal heart sounds. Exam reveals no gallop and no friction rub.  No murmur heard.  Pulmonary/Chest: Effort normal and  breath sounds normal. No respiratory distress.  has no wheezes.  Abdominal: Soft. Bowel sounds are normal.  exhibits no distension. There is  Skin = warm, dry, Ext = trace edema   Lab Results  Recent Labs  09/04/16 0506 09/05/16 0426  WBC 10.2 11.1*  HGB 12.4 12.8  HCT 37.6 39.8  NA 153* 154*  K 4.0 3.3*  CL 120* 118*  CO2 24 28  BUN 17 17  CREATININE 0.62 0.65   Liver Panel  Recent Labs  09/02/16 1400  PROT 7.8  ALBUMIN 2.4*  AST 25  ALT 22  ALKPHOS 185*  BILITOT 0.6    Microbiology: 11/19 blood cx ngtd 11/19 csf ngtd 11/17 blood cx serratia ( S ceftaz, cipro, imi,cefepime, ctx) 11/17 ua cx ecoli, enterococcus Studies/Results: Dg Chest Port 1 View  Result Date: 09/05/2016 CLINICAL DATA:  Acute respiratory failure EXAM: PORTABLE CHEST 1 VIEW COMPARISON:  09/04/2016 FINDINGS: Cardiac shadow is within normal limits. Mild aortic calcifications are again seen. An endotracheal tube and nasogastric feeding catheter are again seen and stable. Calcified bilateral breast implants are noted. The lungs are well-aerated without focal infiltrate or sizable effusion. No acute bony abnormality is seen. IMPRESSION: No active disease. Electronically Signed   By: Inez Catalina M.D.   On: 09/05/2016 08:11   Dg Chest Port 1 View  Result Date: 09/04/2016 CLINICAL DATA:  Acute respiratory  failure.  Intubated. EXAM: PORTABLE CHEST 1 VIEW COMPARISON:  09/02/2016 FINDINGS: Endotracheal tube terminates 3 cm above the carina. Enteric tube courses into the left upper abdomen with tip not imaged. Cardiomediastinal silhouette is unchanged and within normal limits. Aortic atherosclerosis is noted. There is mild biapical lung scarring. There is no evidence of acute airspace consolidation, edema, sizable pleural effusion, or pneumothorax. Calcified breast implants are noted. The IMPRESSION: 1. Support devices as above. 2. No evidence of acute airspace disease. 3. Aortic atherosclerosis. Electronically  Signed   By: Logan Bores M.D.   On: 09/04/2016 07:54  MRI: 1. The larger of 2 left cerebral hematomas is likely superinfected, especially given history of surgery 1 month ago - which makes the vasogenic edema unexpected. 2cm area of presumably hemostatic material in this cavity is also more suspicious in this setting, and may be a nidus of infection. Will be glad to correlate with operative report when it become available. 2. Although there is a suprising lack of ependymal and leptomeningeal enhancement for ventriculitis/meningitis, CSF results implies the intraventricular and subarachnoid material is infectious debris. Lateral ventriculomegaly as previously noted.  Assessment/Plan: 65 yo F with recent left parietal hemorrhage, clot evacuation and recent craniotomy  4 weeks ago who came in with AMS, fevers and larger 2 left cerebral hematoma concerning for superinfection since LP c/w with CNS infection and has serratia bloodstream infection  Plan to treat with ceftazadime for 6 wk to treat infected CNS hematoma. This would cover meningitis, bacteremia  No need for TEE since repeat blood cx are negative  Recommend repeat MRI today to see if any improvement in imaging, as discussed with DR Erlinda Hong  Dr Tommy Medal available over the weekend for questions.  Will see back on monday  Sydney Coleman, [facility] Infect[facility] Diseases Cell: [phone] Pager: [phone]  09/05/2016, 1:51 PM

## 2016-09-05 NOTE — Progress Notes (Addendum)
STROKE TEAM PROGRESS NOTE   SUBJECTIVE (INTERVAL HISTORY) Husband is at the bedside. The patient still intubated, not following commands, still open eyes on stimulation and blinking to visual threat. Overnight had low grade fever, this am Na 154. CSF and BCx NGTD. Continued on abx. Will give IVF for hypernatremia. Repeat MRI brain today.    OBJECTIVE Temp:  [98.3 F (36.8 C)-100.8 F (38.2 C)] 98.9 F (37.2 C) (11/22 1200) Pulse Rate:  [82-122] 83 (11/22 1234) Cardiac Rhythm: Sinus tachycardia (11/22 0800) Resp:  [15-29] 15 (11/22 1234) BP: (107-172)/(57-95) 114/74 (11/22 1234) SpO2:  [9 %-100 %] 100 % (11/22 1234) FiO2 (%):  [30 %] 30 % (11/22 1234) Weight:  [134 lb 14.7 oz (61.2 kg)] 134 lb 14.7 oz (61.2 kg) (11/22 0500)  CBC:   Recent Labs Lab 08/31/16 2149  09/02/16 1400  09/04/16 0506 09/05/16 0426  WBC 22.7*  < > 14.8*  < > 10.2 11.1*  NEUTROABS 20.6*  --  13.1*  --   --   --   HGB 15.7  < > 15.3*  < > 12.4 12.8  HCT 45.3  < > 44.1  < > 37.6 39.8  MCV 94.1  < > 94.6  < > 96.4 98.0  PLT 471*  < > 359  < > 292 241  < > = values in this interval not displayed.  Basic Metabolic Panel:   Recent Labs Lab 09/04/16 0506 09/04/16 1635 09/05/16 0426  NA 153*  --  154*  K 4.0  --  3.3*  CL 120*  --  118*  CO2 24  --  28  GLUCOSE 182*  --  145*  BUN 17  --  17  CREATININE 0.62  --  0.65  CALCIUM 8.2*  --  8.1*  MG 2.1 2.0  --   PHOS 1.5* 2.5  --     Lipid Panel:     Component Value Date/Time   CHOL 105 09/02/2016 0115   TRIG 67 09/02/2016 1400   HDL 64 09/02/2016 0115   CHOLHDL 1.6 09/02/2016 0115   VLDL 10 09/02/2016 0115   LDLCALC 31 09/02/2016 0115   HgbA1c:  Lab Results  Component Value Date   HGBA1C 5.7 (H) 09/02/2016   Urine Drug Screen:     Component Value Date/Time   LABOPIA POSITIVE (A) 09/10/2016 0958   COCAINSCRNUR NONE DETECTED 09/13/2016 0958   COCAINSCRNUR NONE DETECTED 05/11/2015 2037   LABBENZ NONE DETECTED 09/03/2016 0958    AMPHETMU NONE DETECTED 08/21/2016 0958   THCU NONE DETECTED 08/17/2016 0958   LABBARB NONE DETECTED 08/20/2016 0958      IMAGING I have personally reviewed the radiological images below and agree with the radiology interpretations.  Ct Head Wo Contrast 08/28/2016 1. Unchanged small amounts of superior parasagittal left convexity and right occipital subarachnoid blood.  2. No new areas of hemorrhage.  3. Unchanged appearance of left parietal packing material. No midline shift, new mass effect or hydrocephalus.  4. Expected evolution of ischemia within the left hemisphere lower lobe predominantly involving the white matter and extending to the left frontal cortex.   08/31/2016 1. Likely subarachnoid hemorrhage at the left convexity, and also at the right occipital lobe. Packing material at the left convexity, reflecting recent evacuation of a hematoma.  2. Evolving subacute infarct at the left cerebral hemisphere, primarily involving the white matter.  3. Chronic encephalomalacia at the white matter of the right frontal lobe, reflecting remote infarct. Scattered small vessel ischemic  microangiopathy.  4. Mild partial opacification of the right mastoid air cells.   Mr Sydney Coleman Wo Contrast 09/02/2016   IMPRESSION: 1. Subacute parenchymal hematoma in the left cerebral hemisphere, the largest is along the left frontal parietal junction has edema, local mass effect, and measures up to 7 cm. 2. Subacute hemorrhage or less likely debris in infratentorial cisterns and lateral ventricles. Mild lateral ventricular hydrocephalus when compared to 2016 comparison. 3. Periventricular T2 hyperintensity is likely a combination of CSF re- absorption and the chronic microvascular disease that was seen in 2016. 4. Superficial siderosis around the cerebral convexities, progressed from 2016. Is there known bleeding diastasis or vascular lesion?  ADDENDUM: Case discussed with Dr. Leonie Man today, patient has positive  blood culture and purulence on LP today. Additionally, the patient's surgery was noted to be ~4 weeks ago. 1. The larger of 2 left cerebral hematomas is likely superinfected, especially given history of surgery 1 month ago - which makes the vasogenic edema unexpected. 2cm area of presumably hemostatic material in this cavity is also more suspicious in this setting, and may be a nidus of infection. Will be glad to correlate with operative report when it become available. 2. Although there is a suprising lack of ependymal and leptomeningeal enhancement for ventriculitis/meningitis, CSF results implies the intraventricular and subarachnoid material is infectious debris. Lateral ventriculomegaly as previously noted.   EEG - This is an abnormal EEG due to moderate to severe diffuse slowing. This is consistent with a global encephalopathic process, nonspecific as to etiology. The greater focal slowing in the posterior L hemisphere is consistent with known hemorrhage in that area. There is no evidence of seizure or epileptiform activity to suggest propensity for seizure on this recording.   Repeat EEG 09/04/16 -  This EEG is abnormal due to moderate diffuse slowing of the background. Diffuse cerebral dysfunction that is non-specific in etiology and can be seen with hypoxic/ischemic injury, toxic/metabolic encephalopathies, or medication effect.  There were no electrographic seizures seen in this study. Clinical correlation is advised.  MRI brain with and without contrast 09/05/16 - pending   PHYSICAL EXAM  Temp:  [98.3 F (36.8 C)-100.8 F (38.2 C)] 98.9 F (37.2 C) (11/22 1200) Pulse Rate:  [82-122] 83 (11/22 1234) Resp:  [15-29] 15 (11/22 1234) BP: (107-172)/(57-95) 114/74 (11/22 1234) SpO2:  [9 %-100 %] 100 % (11/22 1234) FiO2 (%):  [30 %] 30 % (11/22 1234) Weight:  [134 lb 14.7 oz (61.2 kg)] 134 lb 14.7 oz (61.2 kg) (11/22 0500)  General - Well nourished, well developed, intubated and off  propofol.  Ophthalmologic - Fundi not visualized due to noncooperation.  Cardiovascular - Regular rate and rhythm.  Neuro - intubated and off propofol, eyes open, tracking to objects intermittently and blinking to visual threat bilaterally, however, not following commands. Eyes in the middle position and doll's eye present, PERRL. Positive corneal and gag. Currently on pressure support only on vent. On pain stimulation, RUE 0/5, RLE 2/5 on pain, LLE 3/5 on pain, and LUE spontaneous movement. DTR 1+ and no babinski. Sensation, coordination and gait not tested. Nuchal rigidity not tested due to intubation.     ASSESSMENT/PLAN Sydney Coleman is a 65 y.o. female with history of previous TIA, previous stroke, previous subarachnoid hemorrhage, hypertension, hypothyroidism, hyperlipidemia, COPD, chronic headaches, and anxiety presenting with nausea, vomiting, left gaze preference, altered mental status, and no longer speaking.  She did not receive IV t-PA due to new subarachnoid hemorrhages.  Bacterial meningitis /  encephalitis (no acute SAH this time)- hematogenous source from bacteremia vs. Local process due to recent surgical procedure. However, due to clean wound at scalp, the former is more likely  Resultant - fever, chill, leukocytosis  LP consistent with bacterial meningitis  BCx positive for serratia marcescens - pan sensitive except cefazolin.    CSF culture NGTD  CT head - concerning for small SAH x2 - left medial frontal and right inferior parietal, but most likely the proteinaceous secretions  MRI  - concerning for pus at above sites  Repeat MRI brain today  WBC trending down 22.7-19.6-17.6-14.8-10.2-11.1  Intermittent fever  ID on board  On meropenem and zyvox - consider narrowing Abx (discussed with Dr. Baxter Flattery)  No need TEE to rule out endocarditis as G- rods not generally causing endocarditis.   ? Seizure  2-3 episodes of left UE shaking/tremor 09/03/16  EEG x 2  - diffuse slowing  Increase keppra to 1000mg  bid  Hypernatremia  Na 145-149-153-154  Likely due to dehydration with fever, ventilation and limited fluid intack  Will increase IVF   Close monitoring  Left pareital hemorrhage s/p craniotomy 4 weeks ago   Resultant  aphasia and dense right hemiplegia  CT head - encephalomacia at left hemisphere  UDS - positive for opiates  LDL - 31  HgbA1c -  5.7  VTE prophylaxis - subq heparin Diet NPO time specified  aspirin 81 mg daily prior to admission, now on ASA 81mg  daily  Ongoing aggressive stroke risk factor management  Therapy recommendations: pending  Disposition: Pending  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 Krista Blue in clinic.  Unclear etiology at that time  Hypertension  Stable BP goal < 160  Hyperlipidemia  Home meds:  Pravachol 20 mg daily prior to admission  LDL 31  Resume statin on discharge  Tobacco abuse  Current smoker  Smoking cessation counseling will be provided  Other Stroke Risk Factors  Advanced age  Hx stroke/TIA  Family hx stroke (mother)  Other Active Problems  Hypokalemia - 3.4 -> 3.0 -> 4.0-> 3.3  Leukocytosis - 22.7 -> 19.6 -> 17.6 ->14.8 -> 13.0->10.2->11.2  Hx of elevated serum homocysteine in 2016 - repeat homocysteine level pending   Hospital day # 4  This patient is critically ill due to bacterial meningitis, recent left ICH s/p craniectomy, UTI and bacteremia, seizure and at significant risk of neurological worsening, death form sepsis, vasospasm, status epilepticus, recurrent brain bleed. This patient's care requires constant monitoring of vital signs, hemodynamics, respiratory and cardiac monitoring, review of multiple databases, neurological assessment, discussion with family, other specialists and medical decision making of high complexity. I spent 35 minutes of neurocritical care time in the care of this  patient.  Rosalin Hawking, MD PhD Stroke Neurology 09/05/2016 1:32 PM     To contact Stroke Continuity provider, please refer to [email or web address] After hours, contact General Neurology

## 2016-09-05 NOTE — Progress Notes (Addendum)
PULMONARY / CRITICAL CARE MEDICINE   Name: Sydney Coleman MRN: [id number] DOB: 1950-11-08    ADMISSION DATE:  08/18/2016 CONSULTATION DATE:  08/15/2016  REFERRING MD:  Neuro /Dr. Cheral Marker   CHIEF COMPLAINT:  SAH   SUMMARY:   65 y/o F admitted to Neuro ICU on 08/18/2016 with new subarachnoid hemorrhages. 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 She was brought in to ER for gradually worsening mental status that started morning of 08/31/16 . She was at [facility] for rehabilitation and her family reported that her baseline mental status was alert and oriented times three, conversant and able to eat without difficulty. They reported that the patient started complaining of nausea and vomiting. Reportedly she was starting to act unusual and be less responsive at 8 AM on 11/17/17including some left gaze preference but not consistently so. Later in day on 08/31/16 she became nonverbal and acutely ill. She was transported by EMS for further evaluation.   She was taken to [facility] ER where CT head revealed two new subarachnoid hemorrhages, one involving sulci of the medial left frontal lobe and one involving sulci along the right occipital lobe. Also noted on OSH CT was recent craniotomy defect with encephalomalacia in the adjacent left parietofrontal region, consistent with evacuation of recent prior lobar hemorrhage. Chronic small vessel ischemic changes were also noted, evolving subacute infarct of left cerebral hemisphere . She was transferred to [facility] hospital to Neuro ICU.  PCCM consulted.   SUBJECTIVE:  NSC.    VITAL SIGNS: BP 129/89   Pulse (!) 122   Temp 99.1 F (37.3 C) (Oral)   Resp (!) 24   Ht 5\' 3"  (1.6 m)   Wt 134 lb 14.7 oz (61.2 kg)   SpO2 100%   BMI 23.90 kg/m   HEMODYNAMICS:    VENTILATOR SETTINGS: Vent Mode: PSV;CPAP FiO2 (%):  [30 %] 30 % Set Rate:  [14 bmp] 14 bmp Vt Set:  [440 mL] 440  mL PEEP:  [5 cmH20] 5 cmH20 Pressure Support:  [8 cmH20-10 cmH20] 8 cmH20 Plateau Pressure:  [14 cmH20-20 cmH20] 20 cmH20  INTAKE / OUTPUT: I/O last 3 completed shifts: In: [id number] [I.V.:525; NG/GT:1800; IV Piggyback:1630] Out: [id number] [Urine:4225]  PHYSICAL EXAMINATION: General:  Critically ill appearing female in NAD on vent Neuro:  Eyes closed, pupils 3-16mm R, moves BLE with stimulation, R sided weakness. Ativan x 1 during the night HEENT:  Oral mucosa dry, ETT  Cardiovascular:  RRR  Lungs:  Decreased BS in bases w/ no wheezing  Abdomen:  Soft, NT,  Hypoactive BS  Musculoskeletal:  No acute deformities Skin: scalp incision w/ crusting noted.   LABS:  BMET  Recent Labs Lab 09/03/16 0302 09/04/16 0506 09/05/16 0426  NA 149* 153* 154*  K 2.8* 4.0 3.3*  CL 113* 120* 118*  CO2 24 24 28   BUN 13 17 17   CREATININE 0.64 0.62 0.65  GLUCOSE 126* 182* 145*    Electrolytes  Recent Labs Lab 09/03/16 0302 09/03/16 1849 09/04/16 0506 09/04/16 1635 09/05/16 0426  CALCIUM 8.6*  --  8.2*  --  8.1*  MG  --  2.0 2.1 2.0  --   PHOS  --  1.6* 1.5* 2.5  --     CBC  Recent Labs Lab 09/03/16 0302 09/04/16 0506 09/05/16 0426  WBC 13.0* 10.2 11.1*  HGB 13.8 12.4 12.8  HCT 40.2 37.6 39.8  PLT 325 292 241  Coag's  Recent Labs Lab 08/31/16 2149 09/02/2016 0322  APTT 29 29  INR 1.02 1.20    Sepsis Markers  Recent Labs Lab 08/31/16 2211 08/18/2016 0700  LATICACIDVEN 2.5* 1.2    ABG  Recent Labs Lab 09/02/2016 1534 09/02/16 1530 09/04/16 0416  PHART 7.506* 7.525* 7.496*  PCO2ART 32.3 28.7* 35.1  PO2ART 76.0* 314* 114*    Liver Enzymes  Recent Labs Lab 08/31/16 2149 09/08/2016 0322 09/02/16 1400  AST 22 18 25   ALT 26 21 22   ALKPHOS 197* 170* 185*  BILITOT 0.7 0.8 0.6  ALBUMIN 3.5 2.8* 2.4*    Cardiac Enzymes  Recent Labs Lab 08/31/16 2149  TROPONINI <0.03    Glucose  Recent Labs Lab 09/04/16 1156 09/04/16 1620 09/04/16 1930  09/04/16 2308 09/05/16 0316 09/05/16 0748  GLUCAP 142* 183* 107* 195* 105* 164*    Imaging Dg Chest Port 1 View  Result Date: 09/05/2016 CLINICAL DATA:  Acute respiratory failure EXAM: PORTABLE CHEST 1 VIEW COMPARISON:  09/04/2016 FINDINGS: Cardiac shadow is within normal limits. Mild aortic calcifications are again seen. An endotracheal tube and nasogastric feeding catheter are again seen and stable. Calcified bilateral breast implants are noted. The lungs are well-aerated without focal infiltrate or sizable effusion. No acute bony abnormality is seen. IMPRESSION: No active disease. Electronically Signed   By: Inez Catalina M.D.   On: 09/05/2016 08:11     STUDIES:  CT head 11/17 >> Likely subarachnoid hemorrhage at the left convexity, and also at the right occipital lobe. Packing material at the left convexity,reflecting recent evacuation of a hematoma. Evolving subacute infarct at the left cerebral hemisphere, primarily involving the white matter. CT head 11/18 >> no changes  MRI 11/18 >> Subacute parenchymal hematoma in the left cerebral hemisphere, the largest is along the left frontal parietal junction has edema, local mass effect, and measures up to 7 cm.2. Subacute hemorrhage or less likely debris in infratentorial cisterns and lateral ventricles. Mild lateral ventricularhydrocephalus when compared to 2016 comparison. Periventricular T2 hyperintensity is likely a combination of CSFre- absorption and the chronic microvascular disease that was seenin 2016.4. Superficial siderosis around the cerebral convexities, progressed from 2016  EEG 11/18 >> no seizure, global encephalopathic, mod to severe diffuse slowing.  EEG 11/21 >> no seizure, diffuse cerebral dysfunction  CULTURES: [facility] 08/31/16 >> serratia UC 11/17 >> E coli CSF culture 11/19 >> GNR >> BCx2 11/19 >>   ANTIBIOTICS: Merrem 11/18 >> Flagyl 11/19 >> 11/19 linazolid 11/17 >>  SIGNIFICANT EVENTS: 11/17 Transfer from Rehab  to [facility] ER, then to [facility] Neuro ICU for 2 new SAH   LINES/TUBES: ETT 11/19 >>  DISCUSSION: 65 yo with recent [facility] s/p craniotomy and clot evacuation admitted with recurrent SAH .BC panel with + serration. [employer] abx added . LP is consistent with CNS infection.   ASSESSMENT / PLAN:  PULMONARY A: Tachypnea ?secondary neuro / infectious etiology  Intubated for airway protection P:   PRVC 8 cc/kg  Wean PEEP / FiO2 for sats > 92% PSV wean but mental status limits extubation  Intermittent CXR   CARDIOVASCULAR A:  HTN permissive (ok <220/120 but grad. Normalize in 5-7 d per neuro)  P:  Labetalol IV PRN Hold norvasc /cozaar for now  Tele monitoring   RENAL  Recent Labs Lab 09/03/16 0302 09/04/16 0506 09/05/16 0426  K 2.8* 4.0 3.3*     A:   Hypokalemia  Hypophosphatemia P:   Trend BMP / UOP  Replace electrolytes as indicated  KVO IVF  GASTROINTESTINAL A:   At Risk Malnutrition  P:   TF per nutrition PPI   HEMATOLOGIC A:   No active issues  P:  Trend CBC SCD's for DVT prophylaxis   INFECTIOUS A:   Bacteremia -Serratia on BC panel  ? Brain abscess source  GNR in CSF P:   Trend Temp /WBC  Follow cultures Appreciate help from ID Defer ABX to ID  [email or web address] CBG (last 3)   Recent Labs  09/04/16 2308 09/05/16 0316 09/05/16 0748  GLUCAP 195* 105* 164*     A:   Hypothyroid -tsh ok  Hyperglycemia  P:   Continue Synthroid  SSI   NEUROLOGIC A:   Bacterial Meningitis / Encephalitis - ? Related to bacteremia vs local process with recent surgical procedure, clean scalp wound Hx Left Parietal Hemorrhage EEG no seizure  P:   Monitor mental status closely  Neuro/NS following  Avoid sedating rx  AED's per neuro    FAMILY  - Updates: family updated on bedside 11/21 - Inter-disciplinary family meet or Palliative Care meeting due by:  09/08/16  CC Time: 30 minutes    Richardson Landry Minor ACNP Maryanna Shape PCCM Pager [phone] till 3 pm If no  answer page [phone] 09/05/2016, 8:28 AM

## 2016-09-05 NOTE — Progress Notes (Signed)
Pt has episode of SVT with HR in 220's.  Pt did not sustain for long.  Notified Dr. Chase Caller in [place].  No new orders at this time.  Will continue to monitor pt.

## 2016-09-05 NOTE — Progress Notes (Signed)
Pharmacy Antibiotic Note  Sydney Coleman is a 65 y.o. female admitted on 08/16/2016 with bacteremia.  Pharmacy has been consulted for ceftaz dosing.  Plan: Ceftaz 2 g q8h  Height: 5\' 3"  (160 cm) Weight: 134 lb 14.7 oz (61.2 kg) IBW/kg (Calculated) : 52.4  Temp (24hrs), Avg:99.8 F (37.7 C), Min:98.3 F (36.8 C), Max:100.8 F (38.2 C)   Recent Labs Lab 08/31/16 2211  08/29/2016 0700 09/02/16 0115 09/02/16 1400 09/03/16 0302 09/04/16 0506 09/05/16 0426  WBC  --   < >  --  17.6* 14.8* 13.0* 10.2 11.1*  CREATININE  --   < >  --  0.61 0.64 0.64 0.62 0.65  LATICACIDVEN 2.5*  --  1.2  --   --   --   --   --   < > = values in this interval not displayed.  Estimated Creatinine Clearance: 57.2 mL/min (by C-G formula based on SCr of 0.65 mg/dL).    Allergies  Allergen Reactions  . Penicillins Shortness Of Breath    Has patient had a PCN reaction causing immediate rash, facial/tongue/throat swelling, SOB or lightheadedness with hypotension: Yes Has patient had a PCN reaction causing severe rash involving mucus membranes or skin necrosis: No Has patient had a PCN reaction that required hospitalization Yes Has patient had a PCN reaction occurring within the last 10 years: Yes If all of the above answers are "NO", then may proceed with Cephalosporin use.   Marland Kitchen Amoxil [Amoxicillin] Rash    Has patient had a PCN reaction causing immediate rash, facial/tongue/throat swelling, SOB or lightheadedness with hypotension: Yes Has patient had a PCN reaction causing severe rash involving mucus membranes or skin necrosis: Yes Has patient had a PCN reaction that required hospitalization No Has patient had a PCN reaction occurring within the last 10 years: Yes If all of the above answers are "NO", then may proceed with Cephalosporin use.   . Augmentin [Amoxicillin-Pot Clavulanate] Rash    Has patient had a PCN reaction causing immediate rash, facial/tongue/throat swelling, SOB or lightheadedness with  hypotension: Yes Has patient had a PCN reaction causing severe rash involving mucus membranes or skin necrosis: Yes Has patient had a PCN reaction that required hospitalization No Has patient had a PCN reaction occurring within the last 10 years: Yes If all of the above answers are "NO", then may proceed with Cephalosporin use.   . Other Rash    Elastic  . Vancomycin Rash   Levester Fresh, PharmD, BCPS, [facility] Clinical Pharmacist Phone [phone] 09/05/2016 1:59 PM

## 2016-09-06 ENCOUNTER — Inpatient Hospital Stay (HOSPITAL_COMMUNITY): Payer: Medicare Other

## 2016-09-06 ENCOUNTER — Inpatient Hospital Stay (HOSPITAL_COMMUNITY): Payer: Medicare Other | Admitting: Certified Registered Nurse Anesthetist

## 2016-09-06 ENCOUNTER — Encounter (HOSPITAL_COMMUNITY): Admission: EM | Disposition: E | Payer: Self-pay | Source: Home / Self Care | Attending: Neurology

## 2016-09-06 DIAGNOSIS — G009 Bacterial meningitis, unspecified: Secondary | ICD-10-CM

## 2016-09-06 DIAGNOSIS — I10 Essential (primary) hypertension: Secondary | ICD-10-CM | POA: Diagnosis not present

## 2016-09-06 DIAGNOSIS — E039 Hypothyroidism, unspecified: Secondary | ICD-10-CM | POA: Diagnosis not present

## 2016-09-06 DIAGNOSIS — J9601 Acute respiratory failure with hypoxia: Secondary | ICD-10-CM

## 2016-09-06 DIAGNOSIS — J9600 Acute respiratory failure, unspecified whether with hypoxia or hypercapnia: Secondary | ICD-10-CM

## 2016-09-06 DIAGNOSIS — G060 Intracranial abscess and granuloma: Secondary | ICD-10-CM | POA: Diagnosis not present

## 2016-09-06 DIAGNOSIS — R509 Fever, unspecified: Secondary | ICD-10-CM

## 2016-09-06 DIAGNOSIS — J449 Chronic obstructive pulmonary disease, unspecified: Secondary | ICD-10-CM | POA: Diagnosis not present

## 2016-09-06 DIAGNOSIS — G0490 Encephalitis and encephalomyelitis, unspecified: Secondary | ICD-10-CM

## 2016-09-06 HISTORY — PX: CRANIOTOMY: SHX93

## 2016-09-06 LAB — POCT I-STAT 7, (LYTES, BLD GAS, ICA,H+H)
Acid-Base Excess: 7 mmol/L — ABNORMAL HIGH (ref 0.0–2.0)
Bicarbonate: 33.1 mmol/L — ABNORMAL HIGH (ref 20.0–28.0)
Calcium, Ion: 1.16 mmol/L (ref 1.15–1.40)
HEMATOCRIT: 29 % — AB (ref 36.0–46.0)
HEMOGLOBIN: 9.9 g/dL — AB (ref 12.0–15.0)
O2 SAT: 100 %
PCO2 ART: 53.9 mmHg — AB (ref 32.0–48.0)
POTASSIUM: 3.3 mmol/L — AB (ref 3.5–5.1)
Patient temperature: 37.2
Sodium: 154 mmol/L — ABNORMAL HIGH (ref 135–145)
TCO2: 35 mmol/L (ref 0–100)
pH, Arterial: 7.398 (ref 7.350–7.450)
pO2, Arterial: 419 mmHg — ABNORMAL HIGH (ref 83.0–108.0)

## 2016-09-06 LAB — CBC
HCT: 38 % (ref 36.0–46.0)
HEMOGLOBIN: 12.1 g/dL (ref 12.0–15.0)
MCH: 31.3 pg (ref 26.0–34.0)
MCHC: 31.8 g/dL (ref 30.0–36.0)
MCV: 98.2 fL (ref 78.0–100.0)
Platelets: 202 10*3/uL (ref 150–400)
RBC: 3.87 MIL/uL (ref 3.87–5.11)
RDW: 13.7 % (ref 11.5–15.5)
WBC: 9.5 10*3/uL (ref 4.0–10.5)

## 2016-09-06 LAB — URINALYSIS, ROUTINE W REFLEX MICROSCOPIC
Bilirubin Urine: NEGATIVE
GLUCOSE, UA: NEGATIVE mg/dL
HGB URINE DIPSTICK: NEGATIVE
Ketones, ur: NEGATIVE mg/dL
Leukocytes, UA: NEGATIVE
Nitrite: NEGATIVE
PH: 7 (ref 5.0–8.0)
Protein, ur: NEGATIVE mg/dL
SPECIFIC GRAVITY, URINE: 1.01 (ref 1.005–1.030)

## 2016-09-06 LAB — TYPE AND SCREEN
ABO/RH(D): A POS
ANTIBODY SCREEN: NEGATIVE

## 2016-09-06 LAB — BASIC METABOLIC PANEL
ANION GAP: 8 (ref 5–15)
BUN: 21 mg/dL — ABNORMAL HIGH (ref 6–20)
CALCIUM: 8.3 mg/dL — AB (ref 8.9–10.3)
CHLORIDE: 122 mmol/L — AB (ref 101–111)
CO2: 26 mmol/L (ref 22–32)
Creatinine, Ser: 0.46 mg/dL (ref 0.44–1.00)
GFR calc non Af Amer: >60 mL/min (ref >60)
Glucose, Bld: 137 mg/dL — ABNORMAL HIGH (ref 65–99)
POTASSIUM: 3 mmol/L — AB (ref 3.5–5.1)
Sodium: 156 mmol/L — ABNORMAL HIGH (ref 135–145)

## 2016-09-06 LAB — GLUCOSE, CAPILLARY
GLUCOSE-CAPILLARY: 144 mg/dL — AB (ref 65–99)
GLUCOSE-CAPILLARY: 133 mg/dL — AB (ref 65–99)
GLUCOSE-CAPILLARY: 99 mg/dL (ref 65–99)
Glucose-Capillary: 100 mg/dL — ABNORMAL HIGH (ref 65–99)
Glucose-Capillary: 119 mg/dL — ABNORMAL HIGH (ref 65–99)
Glucose-Capillary: 120 mg/dL — ABNORMAL HIGH (ref 65–99)
Glucose-Capillary: 121 mg/dL — ABNORMAL HIGH (ref 65–99)

## 2016-09-06 LAB — MAGNESIUM: MAGNESIUM: 1.9 mg/dL (ref 1.7–2.4)

## 2016-09-06 LAB — PROTIME-INR
INR: 1.02
Prothrombin Time: 13.4 seconds (ref 11.4–15.2)

## 2016-09-06 LAB — PHOSPHORUS: PHOSPHORUS: 2.3 mg/dL — AB (ref 2.5–4.6)

## 2016-09-06 LAB — ABO/RH: ABO/RH(D): A POS

## 2016-09-06 SURGERY — CRANIOTOMY HEMATOMA EVACUATION SUBDURAL
Anesthesia: General | Site: Head | Laterality: Left

## 2016-09-06 MED ORDER — ROCURONIUM BROMIDE 10 MG/ML (PF) SYRINGE
PREFILLED_SYRINGE | INTRAVENOUS | Status: AC
  Filled 2016-09-06: qty 10

## 2016-09-06 MED ORDER — BACITRACIN ZINC 500 UNIT/GM EX OINT
TOPICAL_OINTMENT | CUTANEOUS | Status: AC
  Filled 2016-09-06: qty 28.35

## 2016-09-06 MED ORDER — THROMBIN 20000 UNITS EX SOLR
CUTANEOUS | Status: AC
  Filled 2016-09-06: qty 20000

## 2016-09-06 MED ORDER — BACITRACIN ZINC 500 UNIT/GM EX OINT
TOPICAL_OINTMENT | CUTANEOUS | Status: DC | PRN
  Administered 2016-09-06: 1 via TOPICAL

## 2016-09-06 MED ORDER — SODIUM CHLORIDE 0.9 % IR SOLN
Status: DC | PRN
  Administered 2016-09-06: 14:00:00

## 2016-09-06 MED ORDER — THROMBIN 20000 UNITS EX SOLR
CUTANEOUS | Status: DC | PRN
  Administered 2016-09-06: 14:00:00 via TOPICAL

## 2016-09-06 MED ORDER — SODIUM CHLORIDE 0.9 % IV SOLN: 500.0000 mg | Freq: Two times a day (BID) | INTRAVENOUS | Status: DC

## 2016-09-06 MED ORDER — LABETALOL HCL 5 MG/ML IV SOLN
10.0000 mg | INTRAVENOUS | Status: DC | PRN
  Administered 2016-09-07: 20 mg via INTRAVENOUS
  Filled 2016-09-06: qty 4
  Filled 2016-09-06: qty 8
  Filled 2016-09-06: qty 4
  Filled 2016-09-06: qty 8

## 2016-09-06 MED ORDER — PHENYLEPHRINE HCL 10 MG/ML IJ SOLN
INTRAVENOUS | Status: DC | PRN
  Administered 2016-09-06: 15 ug/min via INTRAVENOUS

## 2016-09-06 MED ORDER — GADOBENATE DIMEGLUMINE 529 MG/ML IV SOLN
15.0000 mL | Freq: Once | INTRAVENOUS | Status: AC
  Administered 2016-09-06: 13 mL via INTRAVENOUS

## 2016-09-06 MED ORDER — KCL IN DEXTROSE-NACL 20-5-0.45 MEQ/L-%-% IV SOLN
INTRAVENOUS | Status: DC
  Administered 2016-09-06 – 2016-09-08 (×4): via INTRAVENOUS
  Administered 2016-09-09 – 2016-09-10 (×2): 75 mL/h via INTRAVENOUS
  Administered 2016-09-11 (×2): via INTRAVENOUS
  Filled 2016-09-06 (×11): qty 1000

## 2016-09-06 MED ORDER — 0.9 % SODIUM CHLORIDE (POUR BTL) OPTIME
TOPICAL | Status: DC | PRN
  Administered 2016-09-06 (×2): 1000 mL

## 2016-09-06 MED ORDER — EPHEDRINE 5 MG/ML INJ
INTRAVENOUS | Status: AC
  Filled 2016-09-06: qty 10

## 2016-09-06 MED ORDER — ONDANSETRON HCL 4 MG/2ML IJ SOLN: 4.0000 mg | INTRAMUSCULAR | Status: DC | PRN

## 2016-09-06 MED ORDER — ONDANSETRON HCL 4 MG PO TABS: 4.0000 mg | ORAL_TABLET | ORAL | Status: DC | PRN

## 2016-09-06 MED ORDER — SODIUM CHLORIDE 0.45 % IV SOLN
INTRAVENOUS | Status: DC | PRN
  Administered 2016-09-06: 12:00:00 via INTRAVENOUS

## 2016-09-06 MED ORDER — PROPOFOL 10 MG/ML IV BOLUS
INTRAVENOUS | Status: AC
  Filled 2016-09-06: qty 20

## 2016-09-06 MED ORDER — ROCURONIUM BROMIDE 10 MG/ML (PF) SYRINGE
PREFILLED_SYRINGE | INTRAVENOUS | Status: DC | PRN
  Administered 2016-09-06: 50 mg via INTRAVENOUS
  Administered 2016-09-06: 20 mg via INTRAVENOUS

## 2016-09-06 MED ORDER — FENTANYL CITRATE (PF) 100 MCG/2ML IJ SOLN
INTRAMUSCULAR | Status: AC
  Filled 2016-09-06: qty 2

## 2016-09-06 MED ORDER — THROMBIN 5000 UNITS EX SOLR
CUTANEOUS | Status: AC
  Filled 2016-09-06: qty 5000

## 2016-09-06 MED ORDER — PANTOPRAZOLE SODIUM 40 MG IV SOLR
40.0000 mg | Freq: Every day | INTRAVENOUS | Status: DC
  Administered 2016-09-06 – 2016-09-13 (×8): 40 mg via INTRAVENOUS
  Filled 2016-09-06 (×8): qty 40

## 2016-09-06 MED ORDER — DEXTROSE 5 % IV SOLN
INTRAVENOUS | Status: DC
  Administered 2016-09-06: 12:00:00 via INTRAVENOUS

## 2016-09-06 MED ORDER — SUCCINYLCHOLINE CHLORIDE 200 MG/10ML IV SOSY
PREFILLED_SYRINGE | INTRAVENOUS | Status: AC
  Filled 2016-09-06: qty 10

## 2016-09-06 MED ORDER — THROMBIN 5000 UNITS EX SOLR
OROMUCOSAL | Status: DC | PRN
  Administered 2016-09-06: 14:00:00 via TOPICAL

## 2016-09-06 MED ORDER — PROPOFOL 10 MG/ML IV BOLUS
INTRAVENOUS | Status: DC | PRN
  Administered 2016-09-06: 100 mg via INTRAVENOUS

## 2016-09-06 MED ORDER — ARTIFICIAL TEARS OP OINT
TOPICAL_OINTMENT | OPHTHALMIC | Status: DC | PRN
  Administered 2016-09-06: 1 via OPHTHALMIC

## 2016-09-06 MED ORDER — FENTANYL CITRATE (PF) 100 MCG/2ML IJ SOLN
INTRAMUSCULAR | Status: DC | PRN
  Administered 2016-09-06 (×3): 100 ug via INTRAVENOUS

## 2016-09-06 MED ORDER — PROMETHAZINE HCL 25 MG PO TABS: 12.5000 mg | ORAL_TABLET | ORAL | Status: DC | PRN

## 2016-09-06 MED ORDER — ATROPINE SULFATE 0.4 MG/ML IV SOSY
PREFILLED_SYRINGE | INTRAVENOUS | Status: AC
  Filled 2016-09-06: qty 2.5

## 2016-09-06 MED ORDER — PHENYLEPHRINE 40 MCG/ML (10ML) SYRINGE FOR IV PUSH (FOR BLOOD PRESSURE SUPPORT)
PREFILLED_SYRINGE | INTRAVENOUS | Status: DC | PRN
  Administered 2016-09-06: 80 ug via INTRAVENOUS
  Administered 2016-09-06 (×2): 40 ug via INTRAVENOUS

## 2016-09-06 MED ORDER — LIDOCAINE 2% (20 MG/ML) 5 ML SYRINGE
INTRAMUSCULAR | Status: AC
  Filled 2016-09-06: qty 5

## 2016-09-06 MED ORDER — PHENYLEPHRINE 40 MCG/ML (10ML) SYRINGE FOR IV PUSH (FOR BLOOD PRESSURE SUPPORT)
PREFILLED_SYRINGE | INTRAVENOUS | Status: AC
  Filled 2016-09-06: qty 10

## 2016-09-06 MED ORDER — POTASSIUM PHOSPHATES 15 MMOLE/5ML IV SOLN
30.0000 mmol | Freq: Once | INTRAVENOUS | Status: AC
  Administered 2016-09-06: 30 mmol via INTRAVENOUS
  Filled 2016-09-06: qty 10

## 2016-09-06 SURGICAL SUPPLY — 88 items
BAG DECANTER FOR FLEXI CONT (MISCELLANEOUS) ×4 IMPLANT
BANDAGE GAUZE 4  KLING STR (GAUZE/BANDAGES/DRESSINGS) IMPLANT
BIT DRILL WIRE PASS 1.3MM (BIT) IMPLANT
BLADE CLIPPER SURG (BLADE) ×4 IMPLANT
BLADE SURG 11 STRL SS (BLADE) ×4 IMPLANT
BNDG COHESIVE 4X5 TAN NS LF (GAUZE/BANDAGES/DRESSINGS) IMPLANT
BUR ACORN 9.0 PRECISION (BURR) IMPLANT
BUR ACORN 9.0MM PRECISION (BURR)
BUR SPIRAL ROUTER 2.3 (BUR) IMPLANT
BUR SPIRAL ROUTER 2.3MM (BUR)
CANISTER SUCT 3000ML PPV (MISCELLANEOUS) ×8 IMPLANT
CARTRIDGE OIL MAESTRO DRILL (MISCELLANEOUS) IMPLANT
CATH VENTRIC 35X38 W/TROCAR LG (CATHETERS) ×4 IMPLANT
CLIP TI MEDIUM 6 (CLIP) IMPLANT
CONT SPEC 4OZ CLIKSEAL STRL BL (MISCELLANEOUS) ×4 IMPLANT
CORDS BIPOLAR (ELECTRODE) IMPLANT
COVER BACK TABLE 60X90IN (DRAPES) IMPLANT
DECANTER SPIKE VIAL GLASS SM (MISCELLANEOUS) ×4 IMPLANT
DERMABOND ADVANCED (GAUZE/BANDAGES/DRESSINGS)
DERMABOND ADVANCED .7 DNX12 (GAUZE/BANDAGES/DRESSINGS) IMPLANT
DIFFUSER DRILL AIR PNEUMATIC (MISCELLANEOUS) IMPLANT
DRAPE NEUROLOGICAL W/INCISE (DRAPES) ×4 IMPLANT
DRAPE SURG 17X23 STRL (DRAPES) IMPLANT
DRAPE WARM FLUID 44X44 (DRAPE) ×4 IMPLANT
DRILL WIRE PASS 1.3MM (BIT)
DRSG OPSITE 4X5.5 SM (GAUZE/BANDAGES/DRESSINGS) ×8 IMPLANT
ELECT CAUTERY BLADE 6.4 (BLADE) IMPLANT
ELECT REM PT RETURN 9FT ADLT (ELECTROSURGICAL) ×4
ELECTRODE REM PT RTRN 9FT ADLT (ELECTROSURGICAL) ×2 IMPLANT
GAUZE SPONGE 4X4 12PLY STRL (GAUZE/BANDAGES/DRESSINGS) ×4 IMPLANT
GAUZE SPONGE 4X4 16PLY XRAY LF (GAUZE/BANDAGES/DRESSINGS) IMPLANT
GLOVE BIO SURGEON STRL SZ 6.5 (GLOVE) IMPLANT
GLOVE BIO SURGEON STRL SZ7 (GLOVE) IMPLANT
GLOVE BIO SURGEON STRL SZ7.5 (GLOVE) IMPLANT
GLOVE BIO SURGEON STRL SZ8 (GLOVE) ×4 IMPLANT
GLOVE BIO SURGEON STRL SZ8.5 (GLOVE) IMPLANT
GLOVE BIO SURGEONS STRL SZ 6.5 (GLOVE)
GLOVE BIOGEL M 8.0 STRL (GLOVE) IMPLANT
GLOVE ECLIPSE 6.5 STRL STRAW (GLOVE) IMPLANT
GLOVE ECLIPSE 7.0 STRL STRAW (GLOVE) IMPLANT
GLOVE ECLIPSE 7.5 STRL STRAW (GLOVE) ×8 IMPLANT
GLOVE ECLIPSE 8.0 STRL XLNG CF (GLOVE) IMPLANT
GLOVE ECLIPSE 8.5 STRL (GLOVE) IMPLANT
GLOVE EXAM NITRILE LRG STRL (GLOVE) IMPLANT
GLOVE EXAM NITRILE XL STR (GLOVE) IMPLANT
GLOVE EXAM NITRILE XS STR PU (GLOVE) IMPLANT
GLOVE INDICATOR 6.5 STRL GRN (GLOVE) IMPLANT
GLOVE INDICATOR 7.0 STRL GRN (GLOVE) IMPLANT
GLOVE INDICATOR 7.5 STRL GRN (GLOVE) IMPLANT
GLOVE INDICATOR 8.0 STRL GRN (GLOVE) IMPLANT
GLOVE INDICATOR 8.5 STRL (GLOVE) ×4 IMPLANT
GLOVE OPTIFIT SS 8.0 STRL (GLOVE) IMPLANT
GLOVE SURG SS PI 6.5 STRL IVOR (GLOVE) IMPLANT
GOWN STRL REUS W/ TWL LRG LVL3 (GOWN DISPOSABLE) IMPLANT
GOWN STRL REUS W/ TWL XL LVL3 (GOWN DISPOSABLE) ×2 IMPLANT
GOWN STRL REUS W/TWL 2XL LVL3 (GOWN DISPOSABLE) ×4 IMPLANT
GOWN STRL REUS W/TWL LRG LVL3 (GOWN DISPOSABLE)
GOWN STRL REUS W/TWL XL LVL3 (GOWN DISPOSABLE) ×2
GRAFT DURAGEN MATRIX 2WX2L ×4 IMPLANT
HEMOSTAT POWDER KIT SURGIFOAM (HEMOSTASIS) ×4 IMPLANT
HEMOSTAT SURGICEL 2X14 (HEMOSTASIS) IMPLANT
HOOK DURA (MISCELLANEOUS) IMPLANT
KIT BASIN OR (CUSTOM PROCEDURE TRAY) ×4 IMPLANT
KIT DRAIN CSF ACCUDRAIN (MISCELLANEOUS) ×4 IMPLANT
KIT ROOM TURNOVER OR (KITS) ×4 IMPLANT
NEEDLE HYPO 25X1 1.5 SAFETY (NEEDLE) ×4 IMPLANT
NS IRRIG 1000ML POUR BTL (IV SOLUTION) ×8 IMPLANT
OIL CARTRIDGE MAESTRO DRILL (MISCELLANEOUS)
PACK CRANIOTOMY (CUSTOM PROCEDURE TRAY) ×4 IMPLANT
PAD ARMBOARD 7.5X6 YLW CONV (MISCELLANEOUS) ×8 IMPLANT
PATTIES SURGICAL .25X.25 (GAUZE/BANDAGES/DRESSINGS) IMPLANT
PATTIES SURGICAL .5 X.5 (GAUZE/BANDAGES/DRESSINGS) IMPLANT
PATTIES SURGICAL .5 X3 (DISPOSABLE) IMPLANT
PATTIES SURGICAL 1X1 (DISPOSABLE) IMPLANT
PIN MAYFIELD SKULL DISP (PIN) ×4 IMPLANT
SPONGE NEURO XRAY DETECT 1X3 (DISPOSABLE) IMPLANT
SPONGE SURGIFOAM ABS GEL 100 (HEMOSTASIS) ×4 IMPLANT
STAPLER SKIN PROX WIDE 3.9 (STAPLE) IMPLANT
STAPLER VISISTAT 35W (STAPLE) ×4 IMPLANT
SUT ETHILON 3 0 FSL (SUTURE) ×4 IMPLANT
SUT NURALON 4 0 TR CR/8 (SUTURE) ×4 IMPLANT
SUT VIC AB 2-0 CT1 18 (SUTURE) ×4 IMPLANT
SYR CONTROL 10ML LL (SYRINGE) ×4 IMPLANT
TOWEL OR 17X24 6PK STRL BLUE (TOWEL DISPOSABLE) ×4 IMPLANT
TOWEL OR 17X26 10 PK STRL BLUE (TOWEL DISPOSABLE) ×4 IMPLANT
TRAY FOLEY W/METER SILVER 16FR (SET/KITS/TRAYS/PACK) IMPLANT
UNDERPAD 30X30 (UNDERPADS AND DIAPERS) IMPLANT
WATER STERILE IRR 1000ML POUR (IV SOLUTION) ×4 IMPLANT

## 2016-09-06 NOTE — Progress Notes (Signed)
Patient transported to MRI on the vent and return to unit without any complications.

## 2016-09-06 NOTE — Consult Note (Signed)
Reason for Consult: Intracerebral abscess Referring Physician: Neurology  Sydney Coleman is an 65 y.o. female.  HPI: Patient is a 65 year old female who had a craniotomy for evacuation of a left frontoparietal intracerebral hemorrhage initially did very well postoperatively but presented to the hospital a week ago with progressive worsening mental status and fevers and white count. Workup revealed an infection and meningitis and patient was initiated on antibiotic therapy. Initially the patient responded however over the last few days as had progressive increase in fevers and white blood cell count follow-up imaging has shown and evolution worsening of possible intracerebral abscess as well as ventricular empyema ventriculitis. Due to lack of response to antibiotic treatment the evolution of infection I've recommended redo reexploration of craniotomy for evacuation of intracerebral abscess. I've extensively gone over the risks and benefits of that with the patient and family and they understand and agree to proceed forward.  Past Medical History:  Diagnosis Date  . Acute spontaneous subarachnoid intracranial hemorrhage (HCC)   . Anxiety    better since pin removed;   . Arthritis   . Cancer of vulva ([place])    >15 year ago;   . Chronic headaches   . COPD (chronic obstructive pulmonary disease) ([place])   . Difficult intubation    two (2) teeth broken during intubation  . Elevated lipids   . Hypertension    controlled with medication;   . Hypothyroidism   . PONV (postoperative nausea and vomiting)   . SAH (subarachnoid hemorrhage) ([place]) 05/11/2015  . Stroke [facility])    TIA  . TIA (transient ischemic attack) 2008    Past Surgical History:  Procedure Laterality Date  . ABDOMINAL HYSTERECTOMY    . BREAST SURGERY    . EYE SURGERY Right    Cataract Extraction  . HARDWARE REMOVAL Right 10/27/2015   Procedure: HARDWARE REMOVAL;  Surgeon: Thornton Park, MD;  Location: [facility] ORS;  Service:  Orthopedics;  Laterality: Right;  . HARDWARE REMOVAL Right 05/16/2016   Procedure: HARDWARE REMOVAL;  Surgeon: Thornton Park, MD;  Location: [facility] ORS;  Service: Orthopedics;  Laterality: Right;  . JOINT REPLACEMENT Left    Partial Hip Replacement  . ORIF PATELLA Right 08/10/2015   Procedure: OPEN REDUCTION INTERNAL (ORIF) FIXATION PATELLA TENSION BAND WIRING;  Surgeon: Thornton Park, MD;  Location: [facility] ORS;  Service: Orthopedics;  Laterality: Right;  . PLACEMENT OF BREAST IMPLANTS    . THYROIDECTOMY, PARTIAL    . TONSILLECTOMY    . TOTAL HIP ARTHROPLASTY Left     Family History  Problem Relation Age of Onset  . Congestive Heart Failure Father   . Stroke Mother   . Heart disease Mother     Social History:  reports that she has been smoking Cigarettes.  She has been smoking about 1.00 pack per day. She has never used smokeless tobacco. She reports that she does not drink alcohol or use drugs.  Allergies:  Allergies  Allergen Reactions  . Penicillins Shortness Of Breath    Has patient had a PCN reaction causing immediate rash, facial/tongue/throat swelling, SOB or lightheadedness with hypotension: Yes Has patient had a PCN reaction causing severe rash involving mucus membranes or skin necrosis: No Has patient had a PCN reaction that required hospitalization Yes Has patient had a PCN reaction occurring within the last 10 years: Yes If all of the above answers are "NO", then may proceed with Cephalosporin use.   Marland Kitchen Amoxil [Amoxicillin] Rash    Has patient had a PCN  reaction causing immediate rash, facial/tongue/throat swelling, SOB or lightheadedness with hypotension: Yes Has patient had a PCN reaction causing severe rash involving mucus membranes or skin necrosis: Yes Has patient had a PCN reaction that required hospitalization No Has patient had a PCN reaction occurring within the last 10 years: Yes If all of the above answers are "NO", then may proceed with Cephalosporin use.    . Augmentin [Amoxicillin-Pot Clavulanate] Rash    Has patient had a PCN reaction causing immediate rash, facial/tongue/throat swelling, SOB or lightheadedness with hypotension: Yes Has patient had a PCN reaction causing severe rash involving mucus membranes or skin necrosis: Yes Has patient had a PCN reaction that required hospitalization No Has patient had a PCN reaction occurring within the last 10 years: Yes If all of the above answers are "NO", then may proceed with Cephalosporin use.   . Other Rash    Elastic  . Vancomycin Rash    Medications: I have reviewed the patient's current medications.  Results for orders placed or performed during the hospital encounter of 08/20/2016 (from the past 48 hour(s))  Glucose, capillary     Status: Abnormal   Collection Time: 09/04/16 11:56 AM  Result Value Ref Range   Glucose-Capillary 142 (H) 65 - 99 mg/dL   Comment 1 Notify RN    Comment 2 Document in Chart   Glucose, capillary     Status: Abnormal   Collection Time: 09/04/16  4:20 PM  Result Value Ref Range   Glucose-Capillary 183 (H) 65 - 99 mg/dL  Magnesium     Status: None   Collection Time: 09/04/16  4:35 PM  Result Value Ref Range   Magnesium 2.0 1.7 - 2.4 mg/dL  Phosphorus     Status: None   Collection Time: 09/04/16  4:35 PM  Result Value Ref Range   Phosphorus 2.5 2.5 - 4.6 mg/dL  Glucose, capillary     Status: Abnormal   Collection Time: 09/04/16  7:30 PM  Result Value Ref Range   Glucose-Capillary 107 (H) 65 - 99 mg/dL  Glucose, capillary     Status: Abnormal   Collection Time: 09/04/16 11:08 PM  Result Value Ref Range   Glucose-Capillary 195 (H) 65 - 99 mg/dL  Glucose, capillary     Status: Abnormal   Collection Time: 09/05/16  3:16 AM  Result Value Ref Range   Glucose-Capillary 105 (H) 65 - 99 mg/dL  Basic metabolic panel     Status: Abnormal   Collection Time: 09/05/16  4:26 AM  Result Value Ref Range   Sodium 154 (H) 135 - 145 mmol/L   Potassium 3.3 (L) 3.5 -  5.1 mmol/L    Comment: DELTA CHECK NOTED   Chloride 118 (H) 101 - 111 mmol/L   CO2 28 22 - 32 mmol/L   Glucose, Bld 145 (H) 65 - 99 mg/dL   BUN 17 6 - 20 mg/dL   Creatinine, Ser 0.65 0.44 - 1.00 mg/dL   Calcium 8.1 (L) 8.9 - 10.3 mg/dL   GFR calc non Af Amer >60 >60 mL/min   GFR calc Af Amer >60 >60 mL/min    Comment: (NOTE) The eGFR has been calculated using the CKD EPI equation. This calculation has not been validated in all clinical situations. eGFR's persistently <60 mL/min signify possible Chronic Kidney Disease.    Anion gap 8 5 - 15  CBC     Status: Abnormal   Collection Time: 09/05/16  4:26 AM  Result Value Ref Range  WBC 11.1 (H) 4.0 - 10.5 K/uL   RBC 4.06 3.87 - 5.11 MIL/uL   Hemoglobin 12.8 12.0 - 15.0 g/dL   HCT 39.8 36.0 - 46.0 %   MCV 98.0 78.0 - 100.0 fL   MCH 31.5 26.0 - 34.0 pg   MCHC 32.2 30.0 - 36.0 g/dL   RDW 13.6 11.5 - 15.5 %   Platelets 241 150 - 400 K/uL  Triglycerides     Status: None   Collection Time: 09/05/16  4:26 AM  Result Value Ref Range   Triglycerides 117 <150 mg/dL  Glucose, capillary     Status: Abnormal   Collection Time: 09/05/16  7:48 AM  Result Value Ref Range   Glucose-Capillary 164 (H) 65 - 99 mg/dL   Comment 1 Notify RN    Comment 2 Document in Chart   Glucose, capillary     Status: Abnormal   Collection Time: 09/05/16 11:41 AM  Result Value Ref Range   Glucose-Capillary 128 (H) 65 - 99 mg/dL   Comment 1 Notify RN    Comment 2 Document in Chart   Glucose, capillary     Status: Abnormal   Collection Time: 09/05/16  3:33 PM  Result Value Ref Range   Glucose-Capillary 138 (H) 65 - 99 mg/dL   Comment 1 Notify RN    Comment 2 Document in Chart   Glucose, capillary     Status: Abnormal   Collection Time: 09/05/16  7:23 PM  Result Value Ref Range   Glucose-Capillary 115 (H) 65 - 99 mg/dL  Glucose, capillary     Status: Abnormal   Collection Time: 09/03/2016 12:57 AM  Result Value Ref Range   Glucose-Capillary 120 (H) 65 - 99  mg/dL  Glucose, capillary     Status: Abnormal   Collection Time: 09/12/2016  3:37 AM  Result Value Ref Range   Glucose-Capillary 144 (H) 65 - 99 mg/dL  Glucose, capillary     Status: Abnormal   Collection Time: 08/17/2016  7:39 AM  Result Value Ref Range   Glucose-Capillary 133 (H) 65 - 99 mg/dL  Urinalysis, Routine w reflex microscopic (not at [facility])     Status: None   Collection Time: 09/03/2016  9:35 AM  Result Value Ref Range   Color, Urine YELLOW YELLOW   APPearance CLEAR CLEAR   Specific Gravity, Urine 1.010 1.005 - 1.030   pH 7.0 5.0 - 8.0   Glucose, UA NEGATIVE NEGATIVE mg/dL   Hgb urine dipstick NEGATIVE NEGATIVE   Bilirubin Urine NEGATIVE NEGATIVE   Ketones, ur NEGATIVE NEGATIVE mg/dL   Protein, ur NEGATIVE NEGATIVE mg/dL   Nitrite NEGATIVE NEGATIVE   Leukocytes, UA NEGATIVE NEGATIVE    Comment: MICROSCOPIC NOT DONE ON URINES WITH NEGATIVE PROTEIN, BLOOD, LEUKOCYTES, NITRITE, OR GLUCOSE <1000 mg/dL.  Basic metabolic panel     Status: Abnormal   Collection Time: 09/02/2016 10:00 AM  Result Value Ref Range   Sodium 156 (H) 135 - 145 mmol/L   Potassium 3.0 (L) 3.5 - 5.1 mmol/L   Chloride 122 (H) 101 - 111 mmol/L   CO2 26 22 - 32 mmol/L   Glucose, Bld 137 (H) 65 - 99 mg/dL   BUN 21 (H) 6 - 20 mg/dL   Creatinine, Ser 0.46 0.44 - 1.00 mg/dL   Calcium 8.3 (L) 8.9 - 10.3 mg/dL   GFR calc non Af Amer >60 >60 mL/min   GFR calc Af Amer >60 >60 mL/min    Comment: (NOTE) The eGFR has been  calculated using the CKD EPI equation. This calculation has not been validated in all clinical situations. eGFR's persistently <60 mL/min signify possible Chronic Kidney Disease.    Anion gap 8 5 - 15  CBC     Status: None   Collection Time: 09/13/2016 10:00 AM  Result Value Ref Range   WBC 9.5 4.0 - 10.5 K/uL   RBC 3.87 3.87 - 5.11 MIL/uL   Hemoglobin 12.1 12.0 - 15.0 g/dL   HCT 38.0 36.0 - 46.0 %   MCV 98.2 78.0 - 100.0 fL   MCH 31.3 26.0 - 34.0 pg   MCHC 31.8 30.0 - 36.0 g/dL   RDW 13.7  11.5 - 15.5 %   Platelets 202 150 - 400 K/uL  Phosphorus     Status: Abnormal   Collection Time: 08/31/2016 10:00 AM  Result Value Ref Range   Phosphorus 2.3 (L) 2.5 - 4.6 mg/dL  Magnesium     Status: None   Collection Time: 09/13/2016 10:00 AM  Result Value Ref Range   Magnesium 1.9 1.7 - 2.4 mg/dL    Mr [name] Wo Contrast  Result Date: 08/31/2016 CLINICAL DATA:  65 y/o F; status post recent craniotomy with left parietal hemorrhage 4 weeks ago presenting with altered mental status, bilateral subarachnoid hemorrhage, and CNS infected. EXAM: MRI HEAD WITHOUT AND WITH CONTRAST TECHNIQUE: Multiplanar, multiecho pulse sequences of the brain and surrounding structures were obtained without and with intravenous contrast. CONTRAST:  13m MULTIHANCE GADOBENATE DIMEGLUMINE 529 MG/ML IV SOLN COMPARISON:  08/19/2016 MRI of the head.  09/10/2016 CT of the head. FINDINGS: Brain: Diffuse leptomeningeal enhancement is incomplete FLAIR suppression of sulci over the convexities, cerebellar folia, and pronounced along the surface of the midbrain. Fluid levels within the occipital horns of lateral ventricles with intermediate T1 and T2 signal, isointense on CT, and very low diffusivity. If hemorrhage signal evolution would be expected. Combined with extensive periventricular T2 FLAIR signal abnormality a peripheral enhancement ventriculitis and ventricular empyema is suspected. Left paramedian frontal parietal hematoma is stable in size as is the left frontal paramedian hematoma. Peripheral enhancement associated with the hematomas is nonspecific and can be seen in the setting of early or infection. Several small foci of diffusion restriction in the extra-axial spaces in the pre pontine cistern, cerebellopontine angle, pre medullary cistern, MCA cisterns, sylvian fissures, and posterior to the cerebellum may be related to redistribution of subarachnoid hemorrhage or infection. No diffusion restriction of the brain  parenchyma to suggest new acute infarct. Numerous punctate foci of susceptibility hypointensity throughout the supratentorial brain are compatible with microhemorrhage in appear increased from prior study. 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 Lateral and third ventricles are mildly enlarged probably representing hydrocephalus without interval change. Vascular: Normal flow voids. Skull and upper cervical spine: Postsurgical changes related to left parietal craniotomy with subjacent dural enhancement which may be postsurgical. The skull is otherwise unremarkable. Sinuses/Orbits: Partial opacification of ethmoid air cells. No abnormal signal of paranasal sinuses. Bilateral intra-ocular lens replacement. Other: None. IMPRESSION: 1. Diffuse leptomeningeal enhancement and incomplete FLAIR suppression of sulci over convexities, cerebellar folia, and along the surface of the brainstem, including regions without susceptibility signal abnormality, probably represents a combination of redistribution of subarachnoid hemorrhage and meningitis. This appears progressed on the FLAIR sequence. 2. Periventricular enhancement, increasing periventricular T2 FLAIR hyperintense signal abnormality, and debris within occipital horns of ventricles without typical evolution of blood signal is suspicious for ventriculitis and  ventricular empyema. 3. Stable hemorrhages within the left paramedian anterior frontal and left posterior frontal parietal lobes. Enhancement and margin of hemorrhages is nonspecific can be seen as a reaction to hemorrhage or infection. 4. Stable mild enlargement of the lateral and third ventricles probably represents a component of hydrocephalus. 5. Several punctate foci of susceptibility hypointensity in the supratentorial brain compatible with microhemorrhage are new from prior MR. 6. No acute infarct of the brain identified. No  herniation or significant midline shift. Electronically Signed   By: Kristine Garbe M.D.   On: 09/05/2016 01:05   Dg Chest Port 1 View  Result Date: 08/15/2016 CLINICAL DATA:  Respiratory failure. EXAM: PORTABLE CHEST 1 VIEW COMPARISON:  09/05/2016 FINDINGS: Endotracheal tube and enteric tubes are unchanged. Patient is slightly rotated to the right. Lungs are adequately inflated without consolidation, effusion or pneumothorax. Cardiomediastinal silhouette is within normal. There is calcified plaque over the aortic arch. Remainder of the exam is unchanged. IMPRESSION: No acute cardiopulmonary disease. Aortic atherosclerosis. Tubes and lines unchanged. Electronically Signed   By: Marin Olp M.D.   On: 08/30/2016 08:11   Dg Chest Port 1 View  Result Date: 09/05/2016 CLINICAL DATA:  Acute respiratory failure EXAM: PORTABLE CHEST 1 VIEW COMPARISON:  09/04/2016 FINDINGS: Cardiac shadow is within normal limits. Mild aortic calcifications are again seen. An endotracheal tube and nasogastric feeding catheter are again seen and stable. Calcified bilateral breast implants are noted. The lungs are well-aerated without focal infiltrate or sizable effusion. No acute bony abnormality is seen. IMPRESSION: No active disease. Electronically Signed   By: Inez Catalina M.D.   On: 09/05/2016 08:11    Review of Systems  Unable to perform ROS: Critical illness   Blood pressure 113/68, pulse 82, temperature (!) 102.8 F (39.3 C), temperature source Oral, resp. rate (!) 23, height 5' 3"  (1.6 m), weight 59.9 kg (132 lb 0.9 oz), SpO2 100 %. Physical Exam  Neurological: GCS eye subscore is 4. GCS verbal subscore is 1. GCS motor subscore is 5.  Patient is awake will track but will not follow commands localizes on the left right hemiparesis pupils equal    Assessment/Plan: 65 year female presents with what looks like an intracerebral abscess. We'll take to the OR for I and D.  Suriyah Vergara P 09/10/2016, 11:50  AM

## 2016-09-06 NOTE — Progress Notes (Signed)
PULMONARY / CRITICAL CARE MEDICINE   Name: Sydney Coleman MRN: [id number] DOB: 08-15-1951    ADMISSION DATE:  09/10/2016 CONSULTATION DATE:  08/20/2016  REFERRING MD:  Neuro /Dr. Cheral Marker   CHIEF COMPLAINT:  SAH   SUMMARY:   65 y/o F admitted to Neuro ICU on 09/12/2016 with new subarachnoid hemorrhages. 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 She was brought in to ER for gradually worsening mental status that started morning of 08/31/16 . She was at [facility] for rehabilitation and her family reported that her baseline mental status was alert and oriented times three, conversant and able to eat without difficulty. They reported that the patient started complaining of nausea and vomiting. Reportedly she was starting to act unusual and be less responsive at 8 AM on 11/17/17including some left gaze preference but not consistently so. Later in day on 08/31/16 she became nonverbal and acutely ill. She was transported by EMS for further evaluation.   She was taken to [facility] ER where CT head revealed two new subarachnoid hemorrhages, one involving sulci of the medial left frontal lobe and one involving sulci along the right occipital lobe. Also noted on OSH CT was recent craniotomy defect with encephalomalacia in the adjacent left parietofrontal region, consistent with evacuation of recent prior lobar hemorrhage. Chronic small vessel ischemic changes were also noted, evolving subacute infarct of left cerebral hemisphere . She was transferred to [facility] hospital to Neuro ICU.  PCCM consulted.   SUBJECTIVE:   Concern for retained L parietal sponge/gell foam from previous surgery - plan for OR today per nsgy for removal Na cont trend up.   Febrile 102.8  VITAL SIGNS: BP 113/68   Pulse 82   Temp (!) 102.8 F (39.3 C) (Oral)   Resp (!) 23   Ht 5\' 3"  (1.6 m)   Wt 59.9 kg (132 lb 0.9 oz)   SpO2 100%   BMI 23.39 kg/m    HEMODYNAMICS:    VENTILATOR SETTINGS: Vent Mode: PSV;CPAP FiO2 (%):  [30 %] 30 % Set Rate:  [14 bmp] 14 bmp Vt Set:  [440 mL] 440 mL PEEP:  [5 cmH20] 5 cmH20 Pressure Support:  [8 cmH20] 8 cmH20 Plateau Pressure:  [13 cmH20-16 cmH20] 14 cmH20  INTAKE / OUTPUT: I/O last 3 completed shifts: In: [id number] [I.V.:1025; NG/GT:1775; IV Piggyback:930] Out: 2 [Urine:851; Stool:1]  PHYSICAL EXAMINATION: General:  Critically ill appearing female in NAD on vent Neuro:  Eyes closed, moves BLE with stimulation, does not follow commands, R sided weakness.  HEENT:  Oral mucosa dry, ETT  Cardiovascular:  RRR  Lungs:  resps even non labored on vent, Decreased BS in bases w/ no wheezing  Abdomen:  Soft, NT,  Hypoactive BS  Musculoskeletal:  No acute deformities Skin: scalp incision w/ crusting noted.   LABS:  BMET  Recent Labs Lab 09/04/16 0506 09/05/16 0426 09/04/2016 1000  NA 153* 154* 156*  K 4.0 3.3* 3.0*  CL 120* 118* 122*  CO2 24 28 26   BUN 17 17 21*  CREATININE 0.62 0.65 0.46  GLUCOSE 182* 145* 137*    Electrolytes  Recent Labs Lab 09/04/16 0506 09/04/16 1635 09/05/16 0426 09/02/2016 1000  CALCIUM 8.2*  --  8.1* 8.3*  MG 2.1 2.0  --  1.9  PHOS 1.5* 2.5  --  2.3*    CBC  Recent Labs Lab 09/04/16 0506 09/05/16 0426 09/03/2016 1000  WBC 10.2 11.1* 9.5  HGB  12.4 12.8 12.1  HCT 37.6 39.8 38.0  PLT 292 241 202    Coag's  Recent Labs Lab 08/31/16 2149 08/15/2016 0322  APTT 29 29  INR 1.02 1.20    Sepsis Markers  Recent Labs Lab 08/31/16 2211 08/24/2016 0700  LATICACIDVEN 2.5* 1.2    ABG  Recent Labs Lab 08/26/2016 1534 09/02/16 1530 09/04/16 0416  PHART 7.506* 7.525* 7.496*  PCO2ART 32.3 28.7* 35.1  PO2ART 76.0* 314* 114*    Liver Enzymes  Recent Labs Lab 08/31/16 2149 08/21/2016 0322 09/02/16 1400  AST 22 18 25   ALT 26 21 22   ALKPHOS 197* 170* 185*  BILITOT 0.7 0.8 0.6  ALBUMIN 3.5 2.8* 2.4*    Cardiac Enzymes  Recent Labs Lab  08/31/16 2149  TROPONINI <0.03    Glucose  Recent Labs Lab 09/05/16 1141 09/05/16 1533 09/05/16 1923 08/23/2016 0057 09/07/2016 0337 08/15/2016 0739  GLUCAP 128* 138* 115* 120* 144* 133*    Imaging Mr Brain W Wo Contrast  Result Date: 08/30/2016 CLINICAL DATA:  65 y/o F; status post recent craniotomy with left parietal hemorrhage 4 weeks ago presenting with altered mental status, bilateral subarachnoid hemorrhage, and CNS infected. EXAM: MRI HEAD WITHOUT AND WITH CONTRAST TECHNIQUE: Multiplanar, multiecho pulse sequences of the brain and surrounding structures were obtained without and with intravenous contrast. CONTRAST:  76mL MULTIHANCE GADOBENATE DIMEGLUMINE 529 MG/ML IV SOLN COMPARISON:  09/04/2016 MRI of the head.  08/15/2016 CT of the head. FINDINGS: Brain: Diffuse leptomeningeal enhancement is incomplete FLAIR suppression of sulci over the convexities, cerebellar folia, and pronounced along the surface of the midbrain. Fluid levels within the occipital horns of lateral ventricles with intermediate T1 and T2 signal, isointense on CT, and very low diffusivity. If hemorrhage signal evolution would be expected. Combined with extensive periventricular T2 FLAIR signal abnormality a peripheral enhancement ventriculitis and ventricular empyema is suspected. Left paramedian frontal parietal hematoma is stable in size as is the left frontal paramedian hematoma. Peripheral enhancement associated with the hematomas is nonspecific and can be seen in the setting of early or infection. Several small foci of diffusion restriction in the extra-axial spaces in the pre pontine cistern, cerebellopontine angle, pre medullary cistern, MCA cisterns, sylvian fissures, and posterior to the cerebellum may be related to redistribution of subarachnoid hemorrhage or infection. No diffusion restriction of the brain parenchyma to suggest new acute infarct. Numerous punctate foci of susceptibility hypointensity throughout  the supratentorial brain are compatible with microhemorrhage in appear increased from prior study. 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 Lateral and third ventricles are mildly enlarged probably representing hydrocephalus without interval change. Vascular: Normal flow voids. Skull and upper cervical spine: Postsurgical changes related to left parietal craniotomy with subjacent dural enhancement which may be postsurgical. The skull is otherwise unremarkable. Sinuses/Orbits: Partial opacification of ethmoid air cells. No abnormal signal of paranasal sinuses. Bilateral intra-ocular lens replacement. Other: None. IMPRESSION: 1. Diffuse leptomeningeal enhancement and incomplete FLAIR suppression of sulci over convexities, cerebellar folia, and along the surface of the brainstem, including regions without susceptibility signal abnormality, probably represents a combination of redistribution of subarachnoid hemorrhage and meningitis. This appears progressed on the FLAIR sequence. 2. Periventricular enhancement, increasing periventricular T2 FLAIR hyperintense signal abnormality, and debris within occipital horns of ventricles without typical evolution of blood signal is suspicious for ventriculitis and ventricular empyema. 3. Stable hemorrhages within the left paramedian anterior frontal and left posterior frontal parietal lobes. Enhancement and margin of  hemorrhages is nonspecific can be seen as a reaction to hemorrhage or infection. 4. Stable mild enlargement of the lateral and third ventricles probably represents a component of hydrocephalus. 5. Several punctate foci of susceptibility hypointensity in the supratentorial brain compatible with microhemorrhage are new from prior MR. 6. No acute infarct of the brain identified. No herniation or significant midline shift. Electronically Signed   By: Kristine Garbe M.D.   On:  08/25/2016 01:05   Dg Chest Port 1 View  Result Date: 08/23/2016 CLINICAL DATA:  Respiratory failure. EXAM: PORTABLE CHEST 1 VIEW COMPARISON:  09/05/2016 FINDINGS: Endotracheal tube and enteric tubes are unchanged. Patient is slightly rotated to the right. Lungs are adequately inflated without consolidation, effusion or pneumothorax. Cardiomediastinal silhouette is within normal. There is calcified plaque over the aortic arch. Remainder of the exam is unchanged. IMPRESSION: No acute cardiopulmonary disease. Aortic atherosclerosis. Tubes and lines unchanged. Electronically Signed   By: Marin Olp M.D.   On: 08/21/2016 08:11     STUDIES:  CT head 11/17 >> Likely subarachnoid hemorrhage at the left convexity, and also at the right occipital lobe. Packing material at the left convexity,reflecting recent evacuation of a hematoma. Evolving subacute infarct at the left cerebral hemisphere, primarily involving the white matter. CT head 11/18 >> no changes  MRI 11/18 >> Subacute parenchymal hematoma in the left cerebral hemisphere, the largest is along the left frontal parietal junction has edema, local mass effect, and measures up to 7 cm.2. Subacute hemorrhage or less likely debris in infratentorial cisterns and lateral ventricles. Mild lateral ventricularhydrocephalus when compared to 2016 comparison. Periventricular T2 hyperintensity is likely a combination of CSFre- absorption and the chronic microvascular disease that was seenin 2016.4. Superficial siderosis around the cerebral convexities, progressed from 2016  EEG 11/18 >> no seizure, global encephalopathic, mod to severe diffuse slowing.  EEG 11/21 >> no seizure, diffuse cerebral dysfunction MRI 11/23>>> 1. Diffuse leptomeningeal enhancement and incomplete FLAIR suppression of sulci over convexities, cerebellar folia, and along the surface of the brainstem, including regions without susceptibility signal abnormality, probably represents a  combination of redistribution of subarachnoid hemorrhage and meningitis. This appears progressed on the FLAIR sequence. 2. Periventricular enhancement, increasing periventricular T2 FLAIR hyperintense signal abnormality, and debris within occipital horns of ventricles without typical evolution of blood signal is suspicious for ventriculitis and ventricular empyema. 3. Stable hemorrhages within the left paramedian anterior frontal and left posterior frontal parietal lobes. Enhancement and margin of hemorrhages is nonspecific can be seen as a reaction to hemorrhage or infection. 4. Stable mild enlargement of the lateral and third ventricles probably represents a component of hydrocephalus. 5. Several punctate foci of susceptibility hypointensity in the supratentorial brain compatible with microhemorrhage are new from prior MR. 6. No acute infarct of the brain identified. No herniation or significant midline shift.  CULTURES: BC 08/31/16 >> serratia UC 11/17 >> E coli CSF culture 11/19 >> GNR >>neg BCx2 11/19 >>   ANTIBIOTICS: Merrem 11/18 >> Flagyl 11/19 >> 11/19 linazolid 11/17 >>  SIGNIFICANT EVENTS: 11/17 Transfer from Rehab to [facility] ER, then to [facility] Neuro ICU for 2 new SAH   LINES/TUBES: ETT 11/19 >>  DISCUSSION: 65 yo with recent [facility] s/p craniotomy and clot evacuation admitted with recurrent SAH .BC panel with + serration. [employer] abx added . LP is consistent with CNS infection.   ASSESSMENT / PLAN:  NEUROLOGIC A:   Bacterial Meningitis / Encephalitis - ? Related to bacteremia vs local process with recent surgical procedure,  clean scalp wound Hx Left Parietal Hemorrhage EEG no seizure  Concern for worsening abscess/infection of surgical site - plan to washout and remove bone flap 11/23 P:   Monitor mental status closely  Neuro/NS following  Avoid sedating rx  AED's per neuro  For OR 11/23   PULMONARY A: Tachypnea ?secondary neuro / infectious  etiology  Intubated for airway protection P:   PRVC 8 cc/kg  Wean PEEP / FiO2 for sats > 92% PSV wean but mental status limits extubation  Intermittent CXR   CARDIOVASCULAR A:  HTN - improved.  P:  Labetalol IV PRN Hold norvasc /cozaar for now  Tele monitoring   RENAL A:   Hypokalemia  Hypophosphatemia P:   Trend BMP / UOP  K phos 11/23 Change MIVF to D5W   GASTROINTESTINAL A:   At Risk Malnutrition  P:   TF per nutrition PPI   HEMATOLOGIC A:   No active issues  P:  Trend CBC SCD's for DVT prophylaxis   INFECTIOUS A:   Bacteremia -Serratia on BC panel  ? Brain abscess source  GNR in CSF P:   Trend Temp /WBC  Follow cultures Appreciate help from ID Defer ABX to ID See neuro   ENDOCRINE  Hypothyroid -tsh ok  Hyperglycemia  P:   Continue Synthroid  SSI     FAMILY  - Updates: family updated by dr cram 11/23 - Inter-disciplinary family meet or Palliative Care meeting due by:  09/08/16  Nickolas Madrid, NP 08/19/2016  11:44 AM Pager: [phone] or [phone]  ATTENDING NOTE / ATTESTATION NOTE :   I have discussed the case with the resident/APP  Nickolas Madrid.    I agree with the resident/APP's  history, physical examination, assessment, and plans.    I have edited the above note and modified it according to our agreed history, physical examination, assessment and plan.   Briefly, pt with recent [facility] requiring surgery, admitted for worsening  MS. Had urgent neurosurgery this am requiring draining of cranial pus and removal of part of bone and drain placement.  Pt seen, comfortable, sedated. VSS. On propofol drip. Intubated.  godd ae. Good s1/s2.   Assessment and plan : 1. Bacterial meningitis/encephalitis.  S/P evacuation of cranila abscess/removal of bone 11/23. Cont to observe. Cont abx. Pt has a drain.  2. Acute Hypoxemic resp failure 2/2 unable to protect the airway. Cont vent support.  3. Cont other meds.   I have spent 30  minutes of critical care time with this patient today.  Family :  No family at bedside.    Monica Becton, MD 08/29/2016, 2:48 PM  Pulmonary and Critical Care Pager [phone] After 3 pm or if no answer, call [phone]

## 2016-09-06 NOTE — Progress Notes (Signed)
STROKE TEAM PROGRESS NOTE   SUBJECTIVE (INTERVAL HISTORY) Husband is at the bedside. The patient still intubated, not following commands, still open eyes on stimulation and blinking to visual threat. Overnight continue to be febrile this am 102.8, this am Na 156. CSF and BCx NGTD. Abx changed to fortaz. Repeat MRI concerning for increasing brain abscess and ventriculitis. NSG will be consulted.   OBJECTIVE Temp:  [98.7 F (37.1 C)-102.8 F (39.3 C)] 99.4 F (37.4 C) (11/23 1210) Pulse Rate:  [74-119] 84 (11/23 1210) Cardiac Rhythm: Normal sinus rhythm (11/23 0800) Resp:  [11-37] 15 (11/23 1210) BP: (106-143)/(61-91) 114/61 (11/23 1210) SpO2:  [98 %-100 %] 100 % (11/23 1210) FiO2 (%):  [30 %] 30 % (11/23 1210) Weight:  [132 lb 0.9 oz (59.9 kg)] 132 lb 0.9 oz (59.9 kg) (11/23 0600)  CBC:   Recent Labs Lab 08/31/16 2149  09/02/16 1400  09/05/16 0426 09/07/2016 1000 08/19/2016 1259  WBC 22.7*  < > 14.8*  < > 11.1* 9.5  --   NEUTROABS 20.6*  --  13.1*  --   --   --   --   HGB 15.7  < > 15.3*  < > 12.8 12.1 9.9*  HCT 45.3  < > 44.1  < > 39.8 38.0 29.0*  MCV 94.1  < > 94.6  < > 98.0 98.2  --   PLT 471*  < > 359  < > 241 202  --   < > = values in this interval not displayed.  Basic Metabolic Panel:   Recent Labs Lab 09/04/16 1635 09/05/16 0426 09/08/2016 1000 08/26/2016 1259  NA  --  154* 156* 154*  K  --  3.3* 3.0* 3.3*  CL  --  118* 122*  --   CO2  --  28 26  --   GLUCOSE  --  145* 137*  --   BUN  --  17 21*  --   CREATININE  --  0.65 0.46  --   CALCIUM  --  8.1* 8.3*  --   MG 2.0  --  1.9  --   PHOS 2.5  --  2.3*  --     Lipid Panel:     Component Value Date/Time   CHOL 105 09/02/2016 0115   TRIG 117 09/05/2016 0426   HDL 64 09/02/2016 0115   CHOLHDL 1.6 09/02/2016 0115   VLDL 10 09/02/2016 0115   LDLCALC 31 09/02/2016 0115   HgbA1c:  Lab Results  Component Value Date   HGBA1C 5.7 (H) 09/02/2016   Urine Drug Screen:     Component Value Date/Time   LABOPIA  POSITIVE (A) 08/30/2016 0958   COCAINSCRNUR NONE DETECTED 08/25/2016 0958   COCAINSCRNUR NONE DETECTED 05/11/2015 2037   LABBENZ NONE DETECTED 08/27/2016 0958   AMPHETMU NONE DETECTED 08/30/2016 0958   THCU NONE DETECTED 09/13/2016 0958   LABBARB NONE DETECTED 08/19/2016 0958      IMAGING I have personally reviewed the radiological images below and agree with the radiology interpretations.  Ct Head Wo Contrast 08/19/2016 1. Unchanged small amounts of superior parasagittal left convexity and right occipital subarachnoid blood.  2. No new areas of hemorrhage.  3. Unchanged appearance of left parietal packing material. No midline shift, new mass effect or hydrocephalus.  4. Expected evolution of ischemia within the left hemisphere lower lobe predominantly involving the white matter and extending to the left frontal cortex.   08/31/2016 1. Likely subarachnoid hemorrhage at the left convexity, and also  at the right occipital lobe. Packing material at the left convexity, reflecting recent evacuation of a hematoma.  2. Evolving subacute infarct at the left cerebral hemisphere, primarily involving the white matter.  3. Chronic encephalomalacia at the white matter of the right frontal lobe, reflecting remote infarct. Scattered small vessel ischemic microangiopathy.  4. Mild partial opacification of the right mastoid air cells.   Mr Sydney Coleman Wo Contrast 09/02/2016   IMPRESSION: 1. Subacute parenchymal hematoma in the left cerebral hemisphere, the largest is along the left frontal parietal junction has edema, local mass effect, and measures up to 7 cm. 2. Subacute hemorrhage or less likely debris in infratentorial cisterns and lateral ventricles. Mild lateral ventricular hydrocephalus when compared to 2016 comparison. 3. Periventricular T2 hyperintensity is likely a combination of CSF re- absorption and the chronic microvascular disease that was seen in 2016. 4. Superficial siderosis around the  cerebral convexities, progressed from 2016. Is there known bleeding diastasis or vascular lesion?  ADDENDUM: Case discussed with Dr. Leonie Man today, patient has positive blood culture and purulence on LP today. Additionally, the patient's surgery was noted to be ~4 weeks ago. 1. The larger of 2 left cerebral hematomas is likely superinfected, especially given history of surgery 1 month ago - which makes the vasogenic edema unexpected. 2cm area of presumably hemostatic material in this cavity is also more suspicious in this setting, and may be a nidus of infection. Will be glad to correlate with operative report when it become available. 2. Although there is a suprising lack of ependymal and leptomeningeal enhancement for ventriculitis/meningitis, CSF results implies the intraventricular and subarachnoid material is infectious debris. Lateral ventriculomegaly as previously noted.   EEG - This is an abnormal EEG due to moderate to severe diffuse slowing. This is consistent with a global encephalopathic process, nonspecific as to etiology. The greater focal slowing in the posterior L hemisphere is consistent with known hemorrhage in that area. There is no evidence of seizure or epileptiform activity to suggest propensity for seizure on this recording.   Repeat EEG 09/04/16 -  This EEG is abnormal due to moderate diffuse slowing of the background. Diffuse cerebral dysfunction that is non-specific in etiology and can be seen with hypoxic/ischemic injury, toxic/metabolic encephalopathies, or medication effect.  There were no electrographic seizures seen in this study. Clinical correlation is advised.  Mr Sydney Coleman Wo Contrast  08/20/2016 IMPRESSION: 1. Diffuse leptomeningeal enhancement and incomplete FLAIR suppression of sulci over convexities, cerebellar folia, and along the surface of the brainstem, including regions without susceptibility signal abnormality, probably represents a combination of redistribution of  subarachnoid hemorrhage and meningitis. This appears progressed on the FLAIR sequence. 2. Periventricular enhancement, increasing periventricular T2 FLAIR hyperintense signal abnormality, and debris within occipital horns of ventricles without typical evolution of blood signal is suspicious for ventriculitis and ventricular empyema. 3. Stable hemorrhages within the left paramedian anterior frontal and left posterior frontal parietal lobes. Enhancement and margin of hemorrhages is nonspecific can be seen as a reaction to hemorrhage or infection. 4. Stable mild enlargement of the lateral and third ventricles probably represents a component of hydrocephalus. 5. Several punctate foci of susceptibility hypointensity in the supratentorial brain compatible with microhemorrhage are new from prior MR. 6. No acute infarct of the brain identified. No herniation or significant midline shift. Electronically Signed   By: Kristine Garbe M.D.   On: 08/18/2016 01:05     PHYSICAL EXAM  Temp:  [98.7 F (37.1 C)-102.8 F (39.3 C)] 99.4 F (  37.4 C) (11/23 1210) Pulse Rate:  [74-119] 84 (11/23 1210) Resp:  [11-37] 15 (11/23 1210) BP: (106-143)/(61-91) 114/61 (11/23 1210) SpO2:  [98 %-100 %] 100 % (11/23 1210) FiO2 (%):  [30 %] 30 % (11/23 1210) Weight:  [132 lb 0.9 oz (59.9 kg)] 132 lb 0.9 oz (59.9 kg) (11/23 0600)  General - Well nourished, well developed, intubated and off propofol.  Ophthalmologic - Fundi not visualized due to noncooperation.  Cardiovascular - Regular rate and rhythm.  Neuro - intubated and off propofol, eyes open, tracking to objects intermittently and blinking to visual threat bilaterally, however, not following commands. Eyes in the middle position and doll's eye present, PERRL. Positive corneal and gag. Currently on pressure support only on vent. On pain stimulation, RUE 0/5, RLE 2/5 on pain, LLE 3/5 on pain, and LUE spontaneous movement. DTR 1+ and no babinski. Sensation,  coordination and gait not tested. Nuchal rigidity not tested due to intubation.    ASSESSMENT/PLAN Sydney Coleman is a 66 y.o. female with history of previous TIA, previous stroke, previous subarachnoid hemorrhage, hypertension, hypothyroidism, hyperlipidemia, COPD, chronic headaches, and anxiety presenting with nausea, vomiting, left gaze preference, altered mental status, and no longer speaking.  Bacterial meningitis / encephalitis (no acute SAH this time)- hematogenous source from bacteremia vs. local process due to recent surgical procedure.   Resultant - fever, chill, leukocytosis  LP consistent with bacterial meningitis  BCx positive for serratia marcescens - pan sensitive except cefazolin.    CSF culture NGTD  CT head - concerning for small SAH x2 - left medial frontal and right inferior parietal, but most likely the proteinaceous secretions  MRI  - concerning for pus at above sites  Repeat MRI brain 09/05/16 - progressive ventriculitis and meningitis with increasing brain abscesses  Will consult NSG for surgical consideration  WBC trending down 22.7-19.6-17.6-14.8-10.2-11.1-9.5  febrile Tmax 102.8  ID on board  On Fortaz now - Abx for 6 weeks duration  No need TEE to rule out endocarditis as G- rods not generally causing endocarditis.   ? Seizure  2-3 episodes of left UE shaking/tremor 09/03/16  EEG x 2 - diffuse slowing  Increase keppra to 1000mg  bid  Hypernatremia  Na 145-149-153-154 -156 - 154  Likely due to dehydration with fever, ventilation and limited fluid intack  Will increase IVF   Close monitoring  OK to keep hypernatremia at this time due to brain edema and infection  Consider free water if further increase Na level  Left pareital hemorrhage s/p craniotomy 4 weeks ago   Resultant  aphasia and dense right hemiplegia  CT head - encephalomacia at left hemisphere  UDS - positive for opiates  LDL - 31  HgbA1c -  5.7  VTE  prophylaxis - subq heparin Diet NPO time specified  aspirin 81 mg daily prior to admission, now on ASA 81mg  daily  Ongoing aggressive stroke risk factor management  Therapy recommendations: pending  Disposition: Pending  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 Krista Blue in clinic.  Unclear etiology at that time  Hypertension  Stable BP goal < 160  Hyperlipidemia  Home meds:  Pravachol 20 mg daily prior to admission  LDL 31  Resume statin on discharge  Tobacco abuse  Current smoker  Smoking cessation counseling will be provided  Other Stroke Risk Factors  Advanced age  Hx stroke/TIA  Family hx stroke (mother)  Other Active Problems  Hypokalemia - 3.4 -> 3.0 ->  4.0-> 3.3  Leukocytosis - 22.7 -> 19.6 -> 17.6 ->14.8 -> 13.0->10.2->11.2  Hx of elevated serum homocysteine in 2016 - repeat homocysteine 8.3   Hospital day # 5  This patient is critically ill due to bacterial meningitis, recent left ICH s/p craniectomy, UTI and bacteremia, seizure and at significant risk of neurological worsening, death form sepsis, vasospasm, status epilepticus, recurrent brain bleed. This patient's care requires constant monitoring of vital signs, hemodynamics, respiratory and cardiac monitoring, review of multiple databases, neurological assessment, discussion with family, other specialists and medical decision making of high complexity. I had discussion with daughter and husband, as well discussed with Dr. Jobe Igo in Radiology and Dr. Saintclair Halsted from Neurosurgery. I spent 45 minutes of neurocritical care time in the care of this patient.  Rosalin Hawking, MD PhD Stroke Neurology 08/24/2016 3:40 PM     To contact Stroke Continuity provider, please refer to [email or web address] After hours, contact General Neurology

## 2016-09-06 NOTE — Evaluation (Signed)
SLP Cancellation Note  Patient Details Name: Sydney Coleman MRN: [id number] DOB: Sep 11, 1951   Cancelled treatment:       Reason Eval/Treat Not Completed: Other (comment) (pt remains on vent, please reorder when indicated)   Luanna Salk, [place] [facility] SLP [phone]

## 2016-09-06 NOTE — Anesthesia Preprocedure Evaluation (Signed)
Anesthesia Evaluation  Patient identified by MRN, date of birth, ID band Patient confused    Reviewed: Allergy & Precautions, NPO status , Patient's Chart, lab work & pertinent test results  History of Anesthesia Complications (+) PONV and DIFFICULT AIRWAY  Airway Mallampati: II  TM Distance: >3 FB Neck ROM: Full    Dental   Pulmonary COPD, Current Smoker,    Pulmonary exam normal        Cardiovascular hypertension, Normal cardiovascular exam     Neuro/Psych Anxiety Cerebral Abscess after Drainage of Subdural    GI/Hepatic   Endo/Other  Hypothyroidism   Renal/GU      Musculoskeletal   Abdominal   Peds  Hematology   Anesthesia Other Findings   Reproductive/Obstetrics                             Anesthesia Physical Anesthesia Plan  ASA: III and emergent  Anesthesia Plan: General   Post-op Pain Management:    Induction: Intravenous  Airway Management Planned: Oral ETT  Additional Equipment: Arterial line  Intra-op Plan:   Post-operative Plan: Possible Post-op intubation/ventilation  Informed Consent: I have reviewed the patients History and Physical, chart, labs and discussed the procedure including the risks, benefits and alternatives for the proposed anesthesia with the patient or authorized representative who has indicated his/her understanding and acceptance.     Plan Discussed with: CRNA and Surgeon  Anesthesia Plan Comments:         Anesthesia Quick Evaluation

## 2016-09-06 NOTE — Transfer of Care (Signed)
Immediate Anesthesia Transfer of Care Note  Patient: Sydney Coleman  Procedure(s) Performed: Procedure(s): RE-DO CRANIOTOMY INTRA-CEREBRAL  ABSCESS DRAINAGE (Left)  Patient Location: ICU  Anesthesia Type:General  Level of Consciousness: sedated and Patient remains intubated per anesthesia plan  Airway & Oxygen Therapy: Patient remains intubated per anesthesia plan and Patient placed on Ventilator (see vital sign flow sheet for setting)  Post-op Assessment: Report given to RN and Post -op Vital signs reviewed and stable  Post vital signs: Reviewed and stable  Last Vitals:  Vitals:   08/27/2016 1200 08/22/2016 1210  BP: 114/61 114/61  Pulse: 76 84  Resp: 14 15  Temp:  37.4 C    Last Pain:  Vitals:   08/31/2016 1210  TempSrc: Axillary  PainSc:          Complications: No apparent anesthesia complications

## 2016-09-06 NOTE — Anesthesia Postprocedure Evaluation (Signed)
Anesthesia Post Note  Patient: Sydney Coleman  Procedure(s) Performed: Procedure(s) (LRB): RE-DO CRANIOTOMY INTRA-CEREBRAL  ABSCESS DRAINAGE (Left)  Patient location during evaluation: SICU Anesthesia Type: General Level of consciousness: sedated Pain management: pain level controlled Vital Signs Assessment: post-procedure vital signs reviewed and stable Respiratory status: patient remains intubated per anesthesia plan Cardiovascular status: stable Anesthetic complications: no    Last Vitals:  Vitals:   08/28/2016 1200 08/28/2016 1210  BP: 114/61 114/61  Pulse: 76 84  Resp: 14 15  Temp:  37.4 C    Last Pain:  Vitals:   09/05/2016 1210  TempSrc: Axillary  PainSc:                  Bradon Fester DAVID

## 2016-09-06 NOTE — Anesthesia Procedure Notes (Signed)
Date/Time: 08/28/2016 12:40 PM Performed by: Everlean Cherry A Pre-anesthesia Checklist: Patient identified, Emergency Drugs available, Suction available and Patient being monitored Patient Re-evaluated:Patient Re-evaluated prior to inductionOxygen Delivery Method: Ambu bag Preoxygenation: Pre-oxygenation with 100% oxygen Intubation Type: Inhalational induction with existing ETT Placement Confirmation: positive ETCO2 and breath sounds checked- equal and bilateral Comments: ETT in situ from medical ICU.  Brought to OR via ambu and monitors.  Connected to anesthesia machine and agent turned on.

## 2016-09-06 NOTE — Op Note (Signed)
Preoperative diagnosis: Intracerebral abscess left frontoparietal  Postoperative diagnosis: Same  Procedure: Redo reexploration of craniotomy for evacuation of intracerebral abscess a splint of left frontoparietal ventricular catheter  Surgeon: Dominica Severin Neyda Durango  Anesthesia: Gen.  EBL: Minimal  History of present illness: Patient is a 65 year old female who previously undergone a left frontal parietal craniotomy for hematoma in [place] about a month ago and patient initially did very well however presented hospital a week ago with altered mental status workup revealed meningeal enhancement consistent with possible meningitis patient underwent workup with serial imaging as well as lumbar puncture which confirmed meningitis and ventriculitis however serial imaging also showed a development of progression of intracerebral abscess. Due to patient's failure response antibiotics her progression on MRI findings declining mental status and increased white count we recommended a reexploration of craniotomy site for evacuation of an intracerebral abscess. I extensively went over the risks and benefits of the operation with the patient as well as perioperative course expectations of outcome and alternatives of surgery and the family understood and agreed to proceed forward.  Operative procedure: Patient brought into the or was induced on general seizure positioned supine in pins with a We flexed the left side of her head was shaved prepped and draped in routine sterile fashion her old incision opened up easily. Hemostats showing that it was incompetent and partially dehisced. I obtained immediate cultures and subgaleal space. I then removed the craniotomy flap and bur holes and also sent this specimen cup off for culture. The Gelfoam dura showed early infection I sent additional cultures from the epidural space. I then opened up the dura episodes previous opening and immediate old blood mixed with PERRLA material  came out under pressure aspirated as much this fluid is a coated and sent it in a specimen cup for culture I then used a malleable retractor opened up the resection cavity and resected all debrided did brain and purulent material all around the perimeter of the resection bed. There was a well-organized mixture clot and probable Gelfoam that I removed en masse and sent this also for culture. During the evacuation of the fluid and the necrotic material I did enter the ventricle and there was P Roma material coming out of the ventricle. At this point I took a ventricular catheter and placed it through the resection cavity into the ventricular catheter under direct visualization. Then at this point was close irrigated 16 space was maintained I closed the dura over the navicular ventricular catheter and placed a piece of DuraGen over the dural defect and then closed the galea after resecting the necrotic edges of the scalp and closed the remainder with a running nylon so the catheter in place and hooked up to a collecting system. At the end of case all needle counts sponge counts were correct.

## 2016-09-07 ENCOUNTER — Encounter (HOSPITAL_COMMUNITY): Payer: Self-pay | Admitting: Neurosurgery

## 2016-09-07 ENCOUNTER — Inpatient Hospital Stay (HOSPITAL_COMMUNITY): Payer: Medicare Other

## 2016-09-07 DIAGNOSIS — Z8679 Personal history of other diseases of the circulatory system: Secondary | ICD-10-CM

## 2016-09-07 DIAGNOSIS — Z9889 Other specified postprocedural states: Secondary | ICD-10-CM

## 2016-09-07 DIAGNOSIS — Z978 Presence of other specified devices: Secondary | ICD-10-CM

## 2016-09-07 LAB — BASIC METABOLIC PANEL
Anion gap: 11 (ref 5–15)
BUN: 14 mg/dL (ref 6–20)
CO2: 27 mmol/L (ref 22–32)
Calcium: 8.4 mg/dL — ABNORMAL LOW (ref 8.9–10.3)
Chloride: 111 mmol/L (ref 101–111)
Creatinine, Ser: 0.57 mg/dL (ref 0.44–1.00)
GFR calc Af Amer: >60 mL/min (ref >60)
GLUCOSE: 123 mg/dL — AB (ref 65–99)
POTASSIUM: 3.7 mmol/L (ref 3.5–5.1)
Sodium: 149 mmol/L — ABNORMAL HIGH (ref 135–145)

## 2016-09-07 LAB — GLUCOSE, CAPILLARY
GLUCOSE-CAPILLARY: 152 mg/dL — AB (ref 65–99)
GLUCOSE-CAPILLARY: 143 mg/dL — AB (ref 65–99)
GLUCOSE-CAPILLARY: 114 mg/dL — AB (ref 65–99)
Glucose-Capillary: 102 mg/dL — ABNORMAL HIGH (ref 65–99)
Glucose-Capillary: 137 mg/dL — ABNORMAL HIGH (ref 65–99)
Glucose-Capillary: 113 mg/dL — ABNORMAL HIGH (ref 65–99)

## 2016-09-07 LAB — CBC
HCT: 39.6 % (ref 36.0–46.0)
Hemoglobin: 12.8 g/dL (ref 12.0–15.0)
MCH: 31.8 pg (ref 26.0–34.0)
MCHC: 32.3 g/dL (ref 30.0–36.0)
MCV: 98.3 fL (ref 78.0–100.0)
PLATELETS: 175 10*3/uL (ref 150–400)
RBC: 4.03 MIL/uL (ref 3.87–5.11)
RDW: 13.8 % (ref 11.5–15.5)
WBC: 12 10*3/uL — ABNORMAL HIGH (ref 4.0–10.5)

## 2016-09-07 LAB — URINE CULTURE: Culture: NO GROWTH

## 2016-09-07 LAB — CULTURE, BLOOD (ROUTINE X 2)
Culture: NO GROWTH
Culture: NO GROWTH

## 2016-09-07 LAB — LACTIC ACID, PLASMA: Lactic Acid, Venous: 1.1 mmol/L (ref 0.5–1.9)

## 2016-09-07 LAB — ANAEROBIC CULTURE

## 2016-09-07 MED ORDER — SODIUM CHLORIDE 0.9% FLUSH
10.0000 mL | INTRAVENOUS | Status: DC | PRN
  Administered 2016-09-13: 10 mL

## 2016-09-07 MED ORDER — SODIUM CHLORIDE 0.9 % IV SOLN
2.0000 g | Freq: Three times a day (TID) | INTRAVENOUS | Status: DC
  Administered 2016-09-07 – 2016-09-08 (×3): 2 g via INTRAVENOUS
  Filled 2016-09-07 (×4): qty 2

## 2016-09-07 MED ORDER — ALBUMIN HUMAN 5 % IV SOLN
INTRAVENOUS | Status: AC
  Administered 2016-09-07: 12.5 g via INTRAVENOUS
  Filled 2016-09-07: qty 250

## 2016-09-07 MED ORDER — SODIUM CHLORIDE 0.9 % IV BOLUS (SEPSIS)
1000.0000 mL | Freq: Once | INTRAVENOUS | Status: AC
  Administered 2016-09-07: 1000 mL via INTRAVENOUS

## 2016-09-07 MED ORDER — SODIUM CHLORIDE 0.9% FLUSH
10.0000 mL | Freq: Two times a day (BID) | INTRAVENOUS | Status: DC
  Administered 2016-09-07 (×2): 10 mL
  Administered 2016-09-08: 30 mL
  Administered 2016-09-08: 10 mL
  Administered 2016-09-09: 30 mL
  Administered 2016-09-09 – 2016-09-10 (×2): 10 mL
  Administered 2016-09-10: 20 mL
  Administered 2016-09-11 – 2016-09-13 (×4): 10 mL

## 2016-09-07 MED ORDER — SODIUM CHLORIDE 0.9 % IV BOLUS (SEPSIS)
500.0000 mL | Freq: Once | INTRAVENOUS | Status: AC
  Administered 2016-09-07: 500 mL via INTRAVENOUS

## 2016-09-07 MED ORDER — ASPIRIN 81 MG PO CHEW
81.0000 mg | CHEWABLE_TABLET | Freq: Every day | ORAL | Status: DC
  Administered 2016-09-07 – 2016-09-12 (×6): 81 mg via ORAL
  Filled 2016-09-07 (×6): qty 1

## 2016-09-07 MED ORDER — ALBUMIN HUMAN 5 % IV SOLN
12.5000 g | Freq: Once | INTRAVENOUS | Status: AC
  Administered 2016-09-07: 12.5 g via INTRAVENOUS

## 2016-09-07 NOTE — Progress Notes (Signed)
[place] Progress Note Patient Name: Sydney Coleman DOB: 1951-05-22 MRN: [id number]   Date of Service  09/07/2016  HPI/Events of Note  hypotensive  eICU Interventions  1L fluid bolus     Intervention Category Major Interventions: Hypotension - evaluation and management  [name] 09/07/2016, 6:45 PM

## 2016-09-07 NOTE — Plan of Care (Signed)
Problem: Nutrition: Goal: Dietary intake will improve Outcome: Progressing Tube feedings via cortrak

## 2016-09-07 NOTE — Progress Notes (Signed)
Subjective:  The patient is sedated   Antibiotics:  Anti-infectives    Start     Dose/Rate Route Frequency Ordered Stop   08/20/2016 1348  bacitracin 50,000 Units in sodium chloride irrigation 0.9 % 500 mL irrigation  Status:  Discontinued       As needed 09/08/2016 1348 09/13/2016 1415   09/05/16 1400  cefTAZidime (FORTAZ) 2 g in dextrose 5 % 50 mL IVPB  Status:  Discontinued     2 g 100 mL/hr over 30 Minutes Intravenous Every 8 hours 09/05/16 1356 09/07/16 1510   09/02/16 1600  meropenem (MERREM) 2 g in sodium chloride 0.9 % 100 mL IVPB  Status:  Discontinued     2 g 200 mL/hr over 30 Minutes Intravenous Every 8 hours 09/02/16 1532 09/05/16 1346   09/02/16 1300  linezolid (ZYVOX) IVPB 600 mg  Status:  Discontinued     600 mg 300 mL/hr over 60 Minutes Intravenous Every 12 hours 09/02/16 1158 09/05/16 1346   09/02/16 0930  metroNIDAZOLE (FLAGYL) IVPB 500 mg  Status:  Discontinued     500 mg 100 mL/hr over 60 Minutes Intravenous Every 6 hours 09/02/16 0905 09/02/16 1421   09/09/2016 1500  meropenem (MERREM) 2 g in sodium chloride 0.9 % 100 mL IVPB  Status:  Discontinued     2 g 200 mL/hr over 30 Minutes Intravenous Every 12 hours 08/23/2016 1428 09/02/16 1532   08/21/2016 1300  ciprofloxacin (CIPRO) IVPB 400 mg  Status:  Discontinued     400 mg 200 mL/hr over 60 Minutes Intravenous Every 12 hours 08/23/2016 1230 08/27/2016 1428      Medications: Scheduled Meds: . aspirin  81 mg Oral Daily  . chlorhexidine gluconate (MEDLINE KIT)  15 mL Mouth Rinse BID  . heparin subcutaneous  5,000 Units Subcutaneous Q8H  . insulin aspart  2-6 Units Subcutaneous Q4H  . levETIRAcetam  1,000 mg Intravenous Q12H  . levothyroxine  25 mcg Per Tube QAC breakfast  . mouth rinse  15 mL Mouth Rinse 10 times per day  . pantoprazole (PROTONIX) IV  40 mg Intravenous QHS  . senna-docusate  1 tablet Oral BID  . sodium chloride flush  10-40 mL Intracatheter Q12H  . cyanocobalamin  100 mcg Oral Daily    Continuous Infusions: . dextrose 5 % and 0.45 % NaCl with KCl 20 mEq/L 75 mL/hr at 09/07/16 1300  . dextrose 50 mL/hr at 09/04/2016 1209  . feeding supplement (VITAL AF 1.2 CAL) 1,000 mL (09/07/16 1300)  . propofol (DIPRIVAN) infusion Stopped (09/07/16 1130)   PRN Meds:.acetaminophen **OR** acetaminophen, labetalol, labetalol, LORazepam, ondansetron **OR** ondansetron (ZOFRAN) IV, promethazine, sodium chloride flush, sodium chloride flush    Objective: Weight change: 2 lb 13.9 oz (1.3 kg)  Intake/Output Summary (Last 24 hours) at 09/07/16 1510 Last data filed at 09/07/16 1400  Gross per 24 hour  Intake          2842.86 ml  Output             1693 ml  Net          1149.86 ml   Blood pressure (!) 105/57, pulse 77, temperature (!) 101.2 F (38.4 C), temperature source Oral, resp. rate (!) 22, height 5' 3"  (1.6 m), weight 134 lb 14.7 oz (61.2 kg), SpO2 100 %. Temp:  [98.8 F (37.1 C)-101.2 F (38.4 C)] 101.2 F (38.4 C) (11/24 1200) Pulse Rate:  [73-92] 77 (11/24 1415) Resp:  [14-27] 22 (  11/24 1415) BP: (80-150)/(49-82) 105/57 (11/24 1415) SpO2:  [100 %] 100 % (11/24 1415) Arterial Line BP: (84-177)/(49-81) 107/55 (11/24 1415) FiO2 (%):  [30 %-40 %] 30 % (11/24 1400) Weight:  [134 lb 14.7 oz (61.2 kg)] 134 lb 14.7 oz (61.2 kg) (11/24 0500)  Physical Exam: General: Sedated  HEENT: EOMI, ventricular catheter in place CVS regular rate, normal r,  no murmur rubs or gallops Chest: FAIRLY clear to auscultation bilaterally, no wheezing, rales or rhonchi Abdomen: soft  nondistended, normal bowel sounds, Extremities: no  clubbing or edema noted bilaterally Skin: no rashes Lymph: no new lymphadenopathy Neuro: nonfocal  CBC:  CBC Latest Ref Rng & Units 09/07/2016 08/17/2016 09/04/2016  WBC 4.0 - 10.5 K/uL 12.0(H) - 9.5  Hemoglobin 12.0 - 15.0 g/dL 12.8 9.9(L) 12.1  Hematocrit 36.0 - 46.0 % 39.6 29.0(L) 38.0  Platelets 150 - 400 K/uL 175 - 202      BMET  Recent Labs   08/22/2016 1000 08/28/2016 1259 09/07/16 0444  NA 156* 154* 149*  K 3.0* 3.3* 3.7  CL 122*  --  111  CO2 26  --  27  GLUCOSE 137*  --  123*  BUN 21*  --  14  CREATININE 0.46  --  0.57  CALCIUM 8.3*  --  8.4*     Liver Panel  No results for input(s): PROT, ALBUMIN, AST, ALT, ALKPHOS, BILITOT, BILIDIR, IBILI in the last 72 hours.     Sedimentation Rate No results for input(s): ESRSEDRATE in the last 72 hours. C-Reactive Protein No results for input(s): CRP in the last 72 hours.  Micro Results: Recent Results (from the past 720 hour(s))  Urine culture     Status: Abnormal   Collection Time: 08/31/16 10:11 PM  Result Value Ref Range Status   Specimen Description URINE, RANDOM  Final   Special Requests NONE  Final   Culture (A)  Final    >=100,000 COLONIES/mL ESCHERICHIA COLI 60,000 COLONIES/mL ENTEROCOCCUS FAECALIS    Report Status 09/04/2016 FINAL  Final   Organism ID, Bacteria ESCHERICHIA COLI (A)  Final   Organism ID, Bacteria ENTEROCOCCUS FAECALIS (A)  Final      Susceptibility   Escherichia coli - MIC*    AMPICILLIN <=2 SENSITIVE Sensitive     CEFAZOLIN <=4 SENSITIVE Sensitive     CEFTRIAXONE <=1 SENSITIVE Sensitive     CIPROFLOXACIN <=0.25 SENSITIVE Sensitive     GENTAMICIN <=1 SENSITIVE Sensitive     IMIPENEM 0.5 SENSITIVE Sensitive     NITROFURANTOIN 32 SENSITIVE Sensitive     TRIMETH/SULFA <=20 SENSITIVE Sensitive     AMPICILLIN/SULBACTAM <=2 SENSITIVE Sensitive     PIP/TAZO <=4 SENSITIVE Sensitive     Extended ESBL NEGATIVE Sensitive     * >=100,000 COLONIES/mL ESCHERICHIA COLI   Enterococcus faecalis - MIC*    AMPICILLIN <=2 SENSITIVE Sensitive     LEVOFLOXACIN 1 SENSITIVE Sensitive     NITROFURANTOIN <=16 SENSITIVE Sensitive     VANCOMYCIN 1 SENSITIVE Sensitive     * 60,000 COLONIES/mL ENTEROCOCCUS FAECALIS  Blood Culture (routine x 2)     Status: Abnormal   Collection Time: 08/31/16 10:15 PM  Result Value Ref Range Status   Specimen Description  BLOOD RIGHT ASSIST CONTROL  Final   Special Requests   Final    BOTTLES DRAWN AEROBIC AND ANAEROBIC 14CCAERO,13CCANA   Culture  Setup Time   Final    GRAM NEGATIVE RODS IN BOTH AEROBIC AND ANAEROBIC BOTTLES CRITICAL RESULT CALLED TO, READ  BACK BY AND VERIFIED WITH: HOLLY GILLIAM ON 09/05/2016 AT 1401 BY [facility] Performed at [place] (A)  Final   Report Status 09/03/2016 FINAL  Final   Organism ID, Bacteria SERRATIA MARCESCENS  Final      Susceptibility   Serratia marcescens - MIC*    CEFAZOLIN >=64 RESISTANT Resistant     CEFEPIME <=1 SENSITIVE Sensitive     CEFTAZIDIME <=1 SENSITIVE Sensitive     CEFTRIAXONE <=1 SENSITIVE Sensitive     CIPROFLOXACIN <=0.25 SENSITIVE Sensitive     GENTAMICIN <=1 SENSITIVE Sensitive     TRIMETH/SULFA <=20 SENSITIVE Sensitive     * SERRATIA MARCESCENS  Blood Culture ID Panel (Reflexed)     Status: Abnormal   Collection Time: 08/31/16 10:15 PM  Result Value Ref Range Status   Enterococcus species NOT DETECTED NOT DETECTED Final   Listeria monocytogenes NOT DETECTED NOT DETECTED Final   Staphylococcus species NOT DETECTED NOT DETECTED Final   Staphylococcus aureus NOT DETECTED NOT DETECTED Final   Streptococcus species NOT DETECTED NOT DETECTED Final   Streptococcus agalactiae NOT DETECTED NOT DETECTED Final   Streptococcus pneumoniae NOT DETECTED NOT DETECTED Final   Streptococcus pyogenes NOT DETECTED NOT DETECTED Final   Acinetobacter baumannii NOT DETECTED NOT DETECTED Final   Enterobacteriaceae species DETECTED (A) NOT DETECTED Final    Comment: CRITICAL RESULT CALLED TO, READ BACK BY AND VERIFIED WITH: HOLLY GILLIAM ON 09/08/2016 AT 1401 BY [facility]    Enterobacter cloacae complex NOT DETECTED NOT DETECTED Final   Escherichia coli NOT DETECTED NOT DETECTED Final   Klebsiella oxytoca NOT DETECTED NOT DETECTED Final   Klebsiella pneumoniae NOT DETECTED NOT DETECTED Final   Proteus species NOT DETECTED NOT  DETECTED Final   Serratia marcescens DETECTED (A) NOT DETECTED Final    Comment: CRITICAL RESULT CALLED TO, READ BACK BY AND VERIFIED WITH: HOLLY GILLIAM ON 08/16/2016 AT 1401 BY [facility]    Carbapenem resistance NOT DETECTED NOT DETECTED Final   Haemophilus influenzae NOT DETECTED NOT DETECTED Final   Neisseria meningitidis NOT DETECTED NOT DETECTED Final   Pseudomonas aeruginosa NOT DETECTED NOT DETECTED Final   Candida albicans NOT DETECTED NOT DETECTED Final   Candida glabrata NOT DETECTED NOT DETECTED Final   Candida krusei NOT DETECTED NOT DETECTED Final   Candida parapsilosis NOT DETECTED NOT DETECTED Final   Candida tropicalis NOT DETECTED NOT DETECTED Final  Blood Culture (routine x 2)     Status: Abnormal   Collection Time: 08/31/16 10:33 PM  Result Value Ref Range Status   Specimen Description BLOOD RIGHT ARM  Final   Special Requests   Final    BOTTLES DRAWN AEROBIC AND ANAEROBIC 11CCAERO,12CCANA   Culture  Setup Time   Final    GRAM NEGATIVE RODS IN BOTH AEROBIC AND ANAEROBIC BOTTLES CRITICAL VALUE NOTED.  VALUE IS CONSISTENT WITH PREVIOUSLY REPORTED AND CALLED VALUE.    Culture (A)  Final    SERRATIA MARCESCENS SUSCEPTIBILITIES PERFORMED ON PREVIOUS CULTURE WITHIN THE LAST 5 DAYS. Performed at [facility]    Report Status 09/04/2016 FINAL  Final  MRSA PCR Screening     Status: None   Collection Time: 09/12/2016  5:19 AM  Result Value Ref Range Status   MRSA by PCR NEGATIVE NEGATIVE Final    Comment:        The GeneXpert MRSA Assay (FDA approved for NASAL specimens only), is one component of a comprehensive MRSA colonization surveillance program. It  is not intended to diagnose MRSA infection nor to guide or monitor treatment for MRSA infections.   Culture, Urine     Status: Abnormal   Collection Time: 08/27/2016  9:58 AM  Result Value Ref Range Status   Specimen Description URINE, CATHETERIZED  Final   Special Requests NONE  Final   Culture MULTIPLE  SPECIES PRESENT, SUGGEST RECOLLECTION (A)  Final   Report Status 09/02/2016 FINAL  Final  CSF culture     Status: None   Collection Time: 09/02/16  1:42 PM  Result Value Ref Range Status   Specimen Description CSF  Final   Special Requests NONE  Final   Gram Stain   Final    WBC PRESENT, PREDOMINANTLY PMN GRAM NEGATIVE RODS CRITICAL RESULT CALLED TO, READ BACK BY AND VERIFIED WITH: DR. Yvetta Coder AT 1530 ON [id number] BY S. YARBROUGH    Culture NO GROWTH 3 DAYS  Final   Report Status 09/05/2016 FINAL  Final  Culture, fungus without smear     Status: None (Preliminary result)   Collection Time: 09/02/16  1:42 PM  Result Value Ref Range Status   Specimen Description CSF  Final   Special Requests NONE  Final   Culture NO FUNGUS ISOLATED AFTER 3 DAYS  Final   Report Status PENDING  Incomplete  Anaerobic culture     Status: None   Collection Time: 09/02/16  1:42 PM  Result Value Ref Range Status   Specimen Description CSF  Final   Special Requests NONE  Final   Culture NO ANAEROBES ISOLATED  Final   Report Status 09/07/2016 FINAL  Final  Culture, blood (routine x 2)     Status: None (Preliminary result)   Collection Time: 09/02/16  2:14 PM  Result Value Ref Range Status   Specimen Description BLOOD RIGHT HAND  Final   Special Requests IN PEDIATRIC BOTTLE 1CC  Final   Culture NO GROWTH 4 DAYS  Final   Report Status PENDING  Incomplete  Culture, blood (routine x 2)     Status: None (Preliminary result)   Collection Time: 09/02/16  2:15 PM  Result Value Ref Range Status   Specimen Description BLOOD LEFT HAND  Final   Special Requests IN PEDIATRIC BOTTLE 2.5CC  Final   Culture NO GROWTH 4 DAYS  Final   Report Status PENDING  Incomplete  Urine culture     Status: None   Collection Time: 09/13/2016  9:35 AM  Result Value Ref Range Status   Specimen Description URINE, CATHETERIZED  Final   Special Requests NONE  Final   Culture NO GROWTH  Final   Report Status 09/07/2016 FINAL  Final    Aerobic Culture (superficial specimen)     Status: None (Preliminary result)   Collection Time: 09/05/2016  1:11 PM  Result Value Ref Range Status   Specimen Description WOUND  Final   Special Requests   Final    SUBGALEAL SPACE SPECIMEN A PATIENT ON FOLLOWING  CEFTAZ   Gram Stain   Final    FEW WBC PRESENT, PREDOMINANTLY PMN NO ORGANISMS SEEN    Culture RARE GRAM NEGATIVE RODS  Final   Report Status PENDING  Incomplete  Aerobic/Anaerobic Culture (surgical/deep wound)     Status: None (Preliminary result)   Collection Time: 09/05/2016  1:13 PM  Result Value Ref Range Status   Specimen Description WOUND ABSCESS  Final   Special Requests   Final    EPIDURAL SPACE SPECIMEN B PATIENT ON FOLLOWING  CEFTAZ   Gram Stain   Final    FEW WBC PRESENT, PREDOMINANTLY PMN NO ORGANISMS SEEN    Culture FEW GRAM NEGATIVE RODS  Final   Report Status PENDING  Incomplete  Aerobic/Anaerobic Culture (surgical/deep wound)     Status: None (Preliminary result)   Collection Time: 08/19/2016  1:36 PM  Result Value Ref Range Status   Specimen Description ABSCESS FLUID  Final   Special Requests   Final    INTERCEREBRAL ABSCESS FLUID SPECIMEN C PATIENT ON FOLLOWING  CEFTAZ   Gram Stain   Final    MODERATE WBC PRESENT, PREDOMINANTLY PMN RARE GRAM NEGATIVE RODS    Culture MODERATE GRAM NEGATIVE RODS  Final   Report Status PENDING  Incomplete    Studies/Results: Ct Head Wo Contrast  Result Date: 09/07/2016 CLINICAL DATA:  64 y/o  F; cerebral abscess post re-exploration. EXAM: CT HEAD WITHOUT CONTRAST TECHNIQUE: Contiguous axial images were obtained from the base of the skull through the vertex without intravenous contrast. COMPARISON:  09/05/2016 MRI of the brain and 08/26/2016 CT of the head. FINDINGS: Brain: There is a resection cavity within the left paramedian frontal parietal lobe containing air, fluid, and high attenuation acute blood products. Traversing the resection cavity, entering the left lateral  ventricle, with tip in periventricular white matter of left frontal lobe is a drainage catheter. Small acute hemorrhage within the left thalamus. There is a small amount of air within the frontal horn of left lateral ventricle. Stable isodense hematoma within the anterior left paramedian frontal lobe. Small volume of pneumocephalus over the right greater than left frontal lobes. Pneumocephalus exerts mass effect on the brain with effacement of sulci and minimal right to left midline shift. Small subdural hematoma along the falx and left tentorium cerebelli and small volume of acute subarachnoid hemorrhage overlying sulci in the region of the resection cavity. No evidence for new large acute territory infarct. Mild decrease in the lateral ventricle size. Persistent hypoattenuation in margins of the ventricles probably representing edema. Vascular: No hyperdense vessel or unexpected calcification. Skull: Left parietal craniectomy with a small amount of fluid in the resection cavity and edema in the overlying scalp. Sinuses/Orbits: No acute finding. Other: Endotracheal and enteric tubes. IMPRESSION: 1. Status post left parietal craniectomy and resection of left frontoparietal hematoma. Small volume of acute hemorrhage within the resection cavity, subarachnoid hemorrhage of surrounding sulci, and trace subdural along posterior falx and left tentorium. 2. Drainage catheter traversing the resection cavity, entering left lateral ventricle, with tip in periventricular white matter of left frontal lobe. 3. Small new acute hemorrhage within the left thalamus. 4. Mild decrease in size of lateral ventricles. Persistent hypoattenuation of ventricular margins likely representing edema. 5. Small volume of pneumocephalus over right greater than left frontal lobes with mass effect effacing sulci and minimal right-to-left midline shift. Electronically Signed   By: Kristine Garbe M.D.   On: 09/07/2016 03:21   Mr [name] VZ  Contrast  Result Date: 08/24/2016 CLINICAL DATA:  65 y/o F; status post recent craniotomy with left parietal hemorrhage 4 weeks ago presenting with altered mental status, bilateral subarachnoid hemorrhage, and CNS infected. EXAM: MRI HEAD WITHOUT AND WITH CONTRAST TECHNIQUE: Multiplanar, multiecho pulse sequences of the brain and surrounding structures were obtained without and with intravenous contrast. CONTRAST:  83m MULTIHANCE GADOBENATE DIMEGLUMINE 529 MG/ML IV SOLN COMPARISON:  08/31/2016 MRI of the head.  09/09/2016 CT of the head. FINDINGS: Brain: Diffuse leptomeningeal enhancement is incomplete FLAIR suppression of sulci over  the convexities, cerebellar folia, and pronounced along the surface of the midbrain. Fluid levels within the occipital horns of lateral ventricles with intermediate T1 and T2 signal, isointense on CT, and very low diffusivity. If hemorrhage signal evolution would be expected. Combined with extensive periventricular T2 FLAIR signal abnormality a peripheral enhancement ventriculitis and ventricular empyema is suspected. Left paramedian frontal parietal hematoma is stable in size as is the left frontal paramedian hematoma. Peripheral enhancement associated with the hematomas is nonspecific and can be seen in the setting of early or infection. Several small foci of diffusion restriction in the extra-axial spaces in the pre pontine cistern, cerebellopontine angle, pre medullary cistern, MCA cisterns, sylvian fissures, and posterior to the cerebellum may be related to redistribution of subarachnoid hemorrhage or infection. No diffusion restriction of the brain parenchyma to suggest new acute infarct. Numerous punctate foci of susceptibility hypointensity throughout the supratentorial brain are compatible with microhemorrhage in appear increased from prior study. 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 Lateral  and third ventricles are mildly enlarged probably representing hydrocephalus without interval change. Vascular: Normal flow voids. Skull and upper cervical spine: Postsurgical changes related to left parietal craniotomy with subjacent dural enhancement which may be postsurgical. The skull is otherwise unremarkable. Sinuses/Orbits: Partial opacification of ethmoid air cells. No abnormal signal of paranasal sinuses. Bilateral intra-ocular lens replacement. Other: None. IMPRESSION: 1. Diffuse leptomeningeal enhancement and incomplete FLAIR suppression of sulci over convexities, cerebellar folia, and along the surface of the brainstem, including regions without susceptibility signal abnormality, probably represents a combination of redistribution of subarachnoid hemorrhage and meningitis. This appears progressed on the FLAIR sequence. 2. Periventricular enhancement, increasing periventricular T2 FLAIR hyperintense signal abnormality, and debris within occipital horns of ventricles without typical evolution of blood signal is suspicious for ventriculitis and ventricular empyema. 3. Stable hemorrhages within the left paramedian anterior frontal and left posterior frontal parietal lobes. Enhancement and margin of hemorrhages is nonspecific can be seen as a reaction to hemorrhage or infection. 4. Stable mild enlargement of the lateral and third ventricles probably represents a component of hydrocephalus. 5. Several punctate foci of susceptibility hypointensity in the supratentorial brain compatible with microhemorrhage are new from prior MR. 6. No acute infarct of the brain identified. No herniation or significant midline shift. Electronically Signed   By: Kristine Garbe M.D.   On: 08/15/2016 01:05   Dg Chest Port 1 View  Result Date: 08/17/2016 CLINICAL DATA:  Respiratory failure. EXAM: PORTABLE CHEST 1 VIEW COMPARISON:  09/05/2016 FINDINGS: Endotracheal tube and enteric tubes are unchanged. Patient is  slightly rotated to the right. Lungs are adequately inflated without consolidation, effusion or pneumothorax. Cardiomediastinal silhouette is within normal. There is calcified plaque over the aortic arch. Remainder of the exam is unchanged. IMPRESSION: No acute cardiopulmonary disease. Aortic atherosclerosis. Tubes and lines unchanged. Electronically Signed   By: Marin Olp M.D.   On: 08/26/2016 08:11      Assessment/Plan:  INTERVAL HISTORY:   The patient's MRI done the 22nd showed significant worsening of her intracranial infection and she is now status post return to the operating room and redo exploration with craniotomy and evacuation of intracerebral abscess and placing of drain into the left frontoparietal ventricular area.  Cultures have been obtained Gram stain is showing gram-negative rods again  Active Problems:   Subarachnoid (nontraumatic) hemorrhage of newborn   Subarachnoid hemorrhage (HCC)   Superficial siderosis of central nervous system   Bacteremia  Brain abscess   Acute respiratory failure with hypoxia (HCC)   Bacterial meningitis   Endotracheally intubated   History of intracranial hemorrhage   History of craniotomy    Sydney Coleman is a 65 y.o. 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 LP c/w with CNS infection and has serratia bloodstream infection,  MRI looked worse is taken back to the operating room by Dr. Saintclair Halsted for redo craniotomy which exposed multiple areas of purulence in the brain as well as in the ventricle status post placement of frontoparietal ventricular drain. Cultures from the intracerebral abscess or showing gram-negative rods.  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  I do not think that her urine culture is of any significance at admission  I expect she developed a brain abscess then flourished and seedted her blood  All follow-up the cultures from the brain abscess closely and narrow as able. It will likely be wise to continue to provide some anaerobic coverage in addition to gram-negative coverage.   LOS: 6 days   Alcide Evener 09/07/2016, 3:11 PM

## 2016-09-07 NOTE — Progress Notes (Signed)
Patient hypotensive again, cuff pressure 71/63 (65), Art 90/50 (63). Very brief episode of SVT followed by Sinus Rhythm. Copy of event sent to chart. Dr. Chase Caller made aware. Orders received. Will continue to monitor.

## 2016-09-07 NOTE — Progress Notes (Addendum)
STROKE TEAM PROGRESS NOTE   SUBJECTIVE (INTERVAL HISTORY) No family is at the bedside. Had discussion with Dr. Saintclair Coleman this morning. She underwent I&D yesterday with Dr. Saintclair Coleman, repeat CT today showed good evacuation and misplaced EVD which has been pulled back. Tmax  100F overnight. On low dose propofol.    OBJECTIVE Temp:  [98.8 F (37.1 C)-100 F (37.8 C)] 100 F (37.8 C) (11/24 0800) Pulse Rate:  [74-92] 75 (11/24 0700) Cardiac Rhythm: Normal sinus rhythm (11/24 0400) Resp:  [14-26] 23 (11/24 0700) BP: (75-150)/(50-80) 139/80 (11/24 0700) SpO2:  [100 %] 100 % (11/24 0700) Arterial Line BP: (87-177)/(45-81) 138/62 (11/24 0700) FiO2 (%):  [30 %-40 %] 40 % (11/24 0409) Weight:  [61.2 kg (134 lb 14.7 oz)] 61.2 kg (134 lb 14.7 oz) (11/24 0500)  CBC:   Recent Labs Lab 08/31/16 2149  09/02/16 1400  08/27/2016 1000 09/12/2016 1259 09/07/16 0444  WBC 22.7*  < > 14.8*  < > 9.5  --  12.0*  NEUTROABS 20.6*  --  13.1*  --   --   --   --   HGB 15.7  < > 15.3*  < > 12.1 9.9* 12.8  HCT 45.3  < > 44.1  < > 38.0 29.0* 39.6  MCV 94.1  < > 94.6  < > 98.2  --  98.3  PLT 471*  < > 359  < > 202  --  175  < > = values in this interval not displayed.  Basic Metabolic Panel:   Recent Labs Lab 09/04/16 1635  09/07/2016 1000 08/17/2016 1259 09/07/16 0444  NA  --   < > 156* 154* 149*  K  --   < > 3.0* 3.3* 3.7  CL  --   < > 122*  --  111  CO2  --   < > 26  --  27  GLUCOSE  --   < > 137*  --  123*  BUN  --   < > 21*  --  14  CREATININE  --   < > 0.46  --  0.57  CALCIUM  --   < > 8.3*  --  8.4*  MG 2.0  --  1.9  --   --   PHOS 2.5  --  2.3*  --   --   < > = values in this interval not displayed.  Lipid Panel:     Component Value Date/Time   CHOL 105 09/02/2016 0115   TRIG 117 09/05/2016 0426   HDL 64 09/02/2016 0115   CHOLHDL 1.6 09/02/2016 0115   VLDL 10 09/02/2016 0115   LDLCALC 31 09/02/2016 0115   HgbA1c:  Lab Results  Component Value Date   HGBA1C 5.7 (H) 09/02/2016   Urine Drug  Screen:     Component Value Date/Time   LABOPIA POSITIVE (A) 08/31/2016 0958   COCAINSCRNUR NONE DETECTED 08/31/2016 0958   COCAINSCRNUR NONE DETECTED 05/11/2015 2037   LABBENZ NONE DETECTED 08/20/2016 0958   AMPHETMU NONE DETECTED 08/31/2016 0958   THCU NONE DETECTED 08/25/2016 0958   LABBARB NONE DETECTED 08/28/2016 0958      IMAGING I have personally reviewed the radiological images below and agree with the radiology interpretations.  Ct Head Wo Contrast 09/07/2016 1. Status post left parietal craniectomy and resection of left frontoparietal hematoma. Small volume of acute hemorrhage within the resection cavity, subarachnoid hemorrhage of surrounding sulci, and trace subdural along posterior falx and left tentorium. 2. Drainage catheter traversing the resection  cavity, entering left lateral ventricle, with tip in periventricular white matter of left frontal lobe. 3. Small new acute hemorrhage within the left thalamus. 4. Mild decrease in size of lateral ventricles. Persistent hypoattenuation of ventricular margins likely representing edema. 5. Small volume of pneumocephalus over right greater than left frontal lobes with mass effect effacing sulci and minimal right-to-left midline shift.   Ct Head Wo Contrast 08/29/2016 1. Unchanged small amounts of superior parasagittal left convexity and right occipital subarachnoid blood.  2. No new areas of hemorrhage.  3. Unchanged appearance of left parietal packing material. No midline shift, new mass effect or hydrocephalus.  4. Expected evolution of ischemia within the left hemisphere lower lobe predominantly involving the white matter and extending to the left frontal cortex.   Ct Head Wo Contrast 08/31/2016 1. Likely subarachnoid hemorrhage at the left convexity, and also at the right occipital lobe. Packing material at the left convexity, reflecting recent evacuation of a hematoma.  2. Evolving subacute infarct at the left cerebral  hemisphere, primarily involving the white matter.  3. Chronic encephalomalacia at the white matter of the right frontal lobe, reflecting remote infarct. Scattered small vessel ischemic microangiopathy.  4. Mild partial opacification of the right mastoid air cells.    Mr Sydney Coleman Contrast 09/02/2016   1. Subacute parenchymal hematoma in the left cerebral hemisphere, the largest is along the left frontal parietal junction has edema, local mass effect, and measures up to 7 cm.  2. Subacute hemorrhage or less likely debris in infratentorial cisterns and lateral ventricles. Mild lateral ventricular hydrocephalus when compared to 2016 comparison.  3. Periventricular T2 hyperintensity is likely a combination of CSF re- absorption and the chronic microvascular disease that was seen in 2016.  4. Superficial siderosis around the cerebral convexities, progressed from 2016. Is there known bleeding diastasis or vascular lesion?   ADDENDUM: Case discussed with Dr. Leonie Coleman today, patient has positive blood culture and purulence on LP today. Additionally, the patient's surgery was noted to be ~4 weeks ago. 1. The larger of 2 left cerebral hematomas is likely superinfected, especially given history of surgery 1 month ago - which makes the vasogenic edema unexpected. 2cm area of presumably hemostatic material in this cavity is also more suspicious in this setting, and may be a nidus of infection. Will be glad to correlate with operative report when it become available. 2. Although there is a suprising lack of ependymal and leptomeningeal enhancement for ventriculitis/meningitis, CSF results implies the intraventricular and subarachnoid material is infectious debris. Lateral ventriculomegaly as previously noted.   EEG - This is an abnormal EEG due to moderate to severe diffuse slowing. This is consistent with a global encephalopathic process, nonspecific as to etiology. The greater focal slowing in the posterior L  hemisphere is consistent with known hemorrhage in that area. There is no evidence of seizure or epileptiform activity to suggest propensity for seizure on this recording.   Repeat EEG 09/04/16 -  This EEG is abnormal due to moderate diffuse slowing of the background. Diffuse cerebral dysfunction that is non-specific in etiology and can be seen with hypoxic/ischemic injury, toxic/metabolic encephalopathies, or medication effect.  There were no electrographic seizures seen in this study. Clinical correlation is advised.  Mr Sydney Coleman Contrast  08/16/2016 1. Diffuse leptomeningeal enhancement and incomplete FLAIR suppression of sulci over convexities, cerebellar folia, and along the surface of the brainstem, including regions without susceptibility signal abnormality, probably represents a combination of redistribution of subarachnoid hemorrhage and  meningitis. This appears progressed on the FLAIR sequence.  2. Periventricular enhancement, increasing periventricular T2 FLAIR hyperintense signal abnormality, and debris within occipital horns of ventricles without typical evolution of blood signal is suspicious for ventriculitis and ventricular empyema.  3. Stable hemorrhages within the left paramedian anterior frontal and left posterior frontal parietal lobes. Enhancement and margin of hemorrhages is nonspecific can be seen as a reaction to hemorrhage or infection.  4. Stable mild enlargement of the lateral and third ventricles probably represents a component of hydrocephalus.  5. Several punctate foci of susceptibility hypointensity in the supratentorial brain compatible with microhemorrhage are new from prior MR.  6. No acute infarct of the brain identified. No herniation or significant midline shift.    PHYSICAL EXAM  Temp:  [98.8 F (37.1 C)-100 F (37.8 C)] 100 F (37.8 C) (11/24 0800) Pulse Rate:  [74-92] 75 (11/24 0700) Resp:  [14-26] 23 (11/24 0700) BP: (75-150)/(50-80) 139/80 (11/24  0700) SpO2:  [100 %] 100 % (11/24 0700) Arterial Line BP: (87-177)/(45-81) 138/62 (11/24 0700) FiO2 (%):  [30 %-40 %] 40 % (11/24 0409) Weight:  [61.2 kg (134 lb 14.7 oz)] 61.2 kg (134 lb 14.7 oz) (11/24 0500)  General - Well nourished, well developed, intubated and on low dose propofol.  Ophthalmologic - Fundi not visualized due to noncooperation.  Cardiovascular - Regular rate and rhythm.  Neuro - intubated and on low dose propofol, eyes open, tracking to objects intermittently and blinking to visual threat bilaterally, however, not following commands. Eyes in the middle position and doll's eye sluggish, PERRL. Positive corneal and gag. On pain stimulation, RUE 0/5, RLE 2/5 on pain, LLE 3/5 on pain, and LUE intermittent spontaneous movement. DTR 1+ and no babinski. Sensation, coordination and gait not tested. Nuchal rigidity not tested due to intubation.    ASSESSMENT/PLAN Sydney Coleman is a 65 y.o. female with history of previous TIA, previous stroke, previous subarachnoid hemorrhage, hypertension, hypothyroidism, hyperlipidemia, COPD, chronic headaches, and anxiety presenting with nausea, vomiting, left gaze preference, altered mental status, and no longer speaking.  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  Surgical I&D - 08/19/2016 - POD #1  Ct Head good evacuation - EVD position corrected  NSG following  ID on board  Continue EVD drainage  Continue IV Abx  Pending abscess cultures  Bacterial meningitis / encephalitis (no acute SAH this time)- hematogenous source from bacteremia vs. local process due to recent surgical procedure.   Resultant - fever, chill, leukocytosis, AMS  LP consistent with bacterial meningitis  BCx positive for serratia marcescens - pan sensitive except cefazolin.    CSF culture NGTD  CT head - concerning for small SAH x2 - left  medial frontal and right inferior parietal, but most likely the proteinaceous secretions  MRI  - concerning for pus at above sites  Repeat MRI brain 09/05/16 - progressive ventriculitis and meningitis with increasing brain abscesses  WBC trending down 22.7-19.6-17.6-14.8-10.2-11.1-9.5 - 12.0  febrile Tmax 102.8 - 100  ID on board  On Fortaz now - Abx for 6 weeks duration  No need TEE to rule out endocarditis as G- rods not generally causing endocarditis.   ? Seizure  2-3 episodes of left UE shaking/tremor 09/03/16  EEG x 2 - diffuse slowing  Increase keppra to 1000mg  bid  Hypernatremia  Na 145-149-153-154 -156 - 154 - 149  Likely due to dehydration with fever, ventilation and limited  fluid intack  continue IVF   Close monitoring  Left pareital hemorrhage s/p craniotomy 4 weeks ago   Resultant  aphasia and dense right hemiplegia  CT head - encephalomacia at left hemisphere  UDS - positive for opiates  LDL - 31  HgbA1c -  5.7  VTE prophylaxis - subq heparin Diet NPO time specified  aspirin 81 mg daily prior to admission, now on ASA 81mg  daily  Ongoing aggressive stroke risk factor management  Therapy recommendations: pending  Disposition: Pending  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 Krista Blue in clinic.  Unclear etiology at that time  Hypertension  Stable  BP goal < 160  Hyperlipidemia  Home meds:  Pravachol 20 mg daily prior to admission  LDL 31  Resume statin on discharge  Tobacco abuse  Current smoker  Smoking cessation counseling will be provided  Other Stroke Risk Factors  Advanced age  Hx stroke/TIA  Family hx stroke (mother)  Other Active Problems  Hypokalemia - 3.4 -> 3.0 -> 4.0-> 3.3 -> 3.7  Leukocytosis - 22.7 -> 19.6 -> 17.6 ->14.8 -> 13.0->10.2->11.2 -> 9.5 -> 12.0  Hx of elevated serum homocysteine in 2016 - repeat homocysteine 8.3   Hospital day # 6  This patient  is critically ill due to bacterial meningitis, recent left ICH s/p craniectomy, UTI and bacteremia, seizure and at significant risk of neurological worsening, death form sepsis, vasospasm, status epilepticus, recurrent brain bleed. This patient's care requires constant monitoring of vital signs, hemodynamics, respiratory and cardiac monitoring, review of multiple databases, neurological assessment, discussion with family, other specialists and medical decision making of high complexity. I spent 40 minutes of neurocritical care time in the care of this patient.  Sydney Hawking, MD PhD Stroke Neurology 09/07/2016 10:56 AM   To contact Stroke Continuity provider, please refer to [email or web address] After hours, contact General Neurology

## 2016-09-07 NOTE — Progress Notes (Signed)
Patient hypotensive 80s/60s via cuff with art line correlation. Dr. Erlinda Hong notified. Order received for 500 cc normal saline bolus. Will continue to monitor.

## 2016-09-07 NOTE — Progress Notes (Signed)
Pharmacy Antibiotic Note  Sydney Coleman is a 65 y.o. female admitted with brain abscess with intraventricular infection and serratia bacteremia, currently being treated with ceftazidime. ID now recommending to change to meropenem. Renal function stable.  Plan: 1) Meropenem 2g IV q8 2) Follow up cultures  Height: 5\' 3"  (160 cm) Weight: 134 lb 14.7 oz (61.2 kg) IBW/kg (Calculated) : 52.4  Temp (24hrs), Avg:99.8 F (37.7 C), Min:98.8 F (37.1 C), Max:101.2 F (38.4 C)   Recent Labs Lab 08/31/16 2211  08/26/2016 0700  09/03/16 0302 09/04/16 0506 09/05/16 0426 08/25/2016 1000 09/07/16 0444 09/07/16 1417  WBC  --   < >  --   < > 13.0* 10.2 11.1* 9.5 12.0*  --   CREATININE  --   < >  --   < > 0.64 0.62 0.65 0.46 0.57  --   LATICACIDVEN 2.5*  --  1.2  --   --   --   --   --   --  1.1  < > = values in this interval not displayed.  Estimated Creatinine Clearance: 57.2 mL/min (by C-G formula based on SCr of 0.57 mg/dL).    Allergies  Allergen Reactions  . Penicillins Shortness Of Breath    Has patient had a PCN reaction causing immediate rash, facial/tongue/throat swelling, SOB or lightheadedness with hypotension: Yes Has patient had a PCN reaction causing severe rash involving mucus membranes or skin necrosis: No Has patient had a PCN reaction that required hospitalization Yes Has patient had a PCN reaction occurring within the last 10 years: Yes If all of the above answers are "NO", then may proceed with Cephalosporin use.   Marland Kitchen Amoxil [Amoxicillin] Rash    Has patient had a PCN reaction causing immediate rash, facial/tongue/throat swelling, SOB or lightheadedness with hypotension: Yes Has patient had a PCN reaction causing severe rash involving mucus membranes or skin necrosis: Yes Has patient had a PCN reaction that required hospitalization No Has patient had a PCN reaction occurring within the last 10 years: Yes If all of the above answers are "NO", then may proceed with  Cephalosporin use.   . Augmentin [Amoxicillin-Pot Clavulanate] Rash    Has patient had a PCN reaction causing immediate rash, facial/tongue/throat swelling, SOB or lightheadedness with hypotension: Yes Has patient had a PCN reaction causing severe rash involving mucus membranes or skin necrosis: Yes Has patient had a PCN reaction that required hospitalization No Has patient had a PCN reaction occurring within the last 10 years: Yes If all of the above answers are "NO", then may proceed with Cephalosporin use.   . Other Rash    Elastic  . Vancomycin Rash    Antimicrobials this admission: 11/17 levaquin x1 11/18 cipro x1 11/18 meropenem >>11/22, resume 11/24 >> 11/19 zyvox >>11/22 11/19 flagyl x1 11/22 ceftaz>> 11/24  Dose adjustments this admission: n/a  Microbiology results: 11/17 blood cx 2/2: serratia 11/18 urine cx: multiple 11/19 csf: GNR on GS, neg cx 11/19 VDRL - ip 11/19 HIV, HSV, Crypto Ag - neg 11/19 blood cx: NGTD 11/19 fungal smear: NGTD 11/18 MRSA - negative 11/17 Urine - Ecoli 11/19 VDRL - ip 11/23 wound cx - 11/23 abscess wound - 11/23 abscess fluid - GNR on Gram stain   Thank you for allowing pharmacy to be a part of this patient's care.  Sydney Coleman 09/07/2016 3:19 PM

## 2016-09-07 NOTE — Progress Notes (Signed)
Patient ID: Sydney Coleman, female   DOB: Aug 27, 1951, 65 y.o.   MRN: [id number] Patient sedated on propofol withdraws to noxious stimulation  Ventricular drain intermittently putting out  CT scan showed good evacuation of intracerebral abscess ventricular catheter migrated further through the frontal horn of the ventricle so I pulled the catheter back approximately 5 cm  Continue critical care management per neurology and critical-care medicine maintain Fortaz.

## 2016-09-07 NOTE — Procedures (Signed)
Transported from 3M12 to CT then back to [id number] without complications.

## 2016-09-07 NOTE — Progress Notes (Signed)
PULMONARY / CRITICAL CARE MEDICINE   Name: Sydney Coleman MRN: [id number] DOB: Jan 20, 1951    ADMISSION DATE:  09/08/2016 CONSULTATION DATE:  08/26/2016  REFERRING MD:  Neuro /Dr. Cheral Marker   CHIEF COMPLAINT:  SAH   SUMMARY:   65 y/o F admitted to Neuro ICU on 08/17/2016 with new subarachnoid hemorrhages. 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 She was brought in to ER for gradually worsening mental status that started morning of 08/31/16 . She was at [facility] for rehabilitation and her family reported that her baseline mental status was alert and oriented times three, conversant and able to eat without difficulty. They reported that the patient started complaining of nausea and vomiting. Reportedly she was starting to act unusual and be less responsive at 8 AM on 11/17/17including some left gaze preference but not consistently so. Later in day on 08/31/16 she became nonverbal and acutely ill. She was transported by EMS for further evaluation.   She was taken to [facility] ER where CT head revealed two new subarachnoid hemorrhages, one involving sulci of the medial left frontal lobe and one involving sulci along the right occipital lobe. Also noted on OSH CT was recent craniotomy defect with encephalomalacia in the adjacent left parietofrontal region, consistent with evacuation of recent prior lobar hemorrhage. Chronic small vessel ischemic changes were also noted, evolving subacute infarct of left cerebral hemisphere . She was transferred to [facility] hospital to Neuro ICU.  PCCM consulted.   SUBJECTIVE:   Stable on vent  VITAL SIGNS: BP 139/80   Pulse 75   Temp 100 F (37.8 C) (Oral)   Resp (!) 23   Ht 5\' 3"  (1.6 m)   Wt 134 lb 14.7 oz (61.2 kg)   SpO2 100%   BMI 23.90 kg/m   HEMODYNAMICS:    VENTILATOR SETTINGS: Vent Mode: PRVC FiO2 (%):  [30 %-40 %] 40 % Set Rate:  [14 bmp] 14 bmp Vt Set:  [440 mL]  440 mL PEEP:  [5 cmH20] 5 cmH20 Pressure Support:  [8 cmH20] 8 cmH20 Plateau Pressure:  [13 cmH20-18 cmH20] 18 cmH20  INTAKE / OUTPUT: I/O last 3 completed shifts: In: 4313.3 [I.V.:2668.3; NG/GT:605; IV Piggyback:1040] Out: [id number] [Urine:1635; Drains:11; Stool:75; Blood:50]  PHYSICAL EXAMINATION: General:  Critically ill appearing female in NAD on vent Neuro:  Eyes closed, moves BLE with stimulation, does not follow commands, R sided weakness.  HEENT:  Oral mucosa dry, ETT  Cardiovascular:  RRR  Lungs:  resps even non labored on vent, Decreased BS in bases w/ no wheezing  Abdomen:  Soft, NT,  Hypoactive BS  Musculoskeletal:  No acute deformities Skin: scalp incision w/ crusting noted.   LABS:  BMET  Recent Labs Lab 09/05/16 0426 08/27/2016 1000 09/08/2016 1259 09/07/16 0444  NA 154* 156* 154* 149*  K 3.3* 3.0* 3.3* 3.7  CL 118* 122*  --  111  CO2 28 26  --  27  BUN 17 21*  --  14  CREATININE 0.65 0.46  --  0.57  GLUCOSE 145* 137*  --  123*    Electrolytes  Recent Labs Lab 09/04/16 0506 09/04/16 1635 09/05/16 0426 09/10/2016 1000 09/07/16 0444  CALCIUM 8.2*  --  8.1* 8.3* 8.4*  MG 2.1 2.0  --  1.9  --   PHOS 1.5* 2.5  --  2.3*  --     CBC  Recent Labs Lab 09/05/16 0426 09/11/2016 1000 08/19/2016 1259 09/07/16  0444  WBC 11.1* 9.5  --  12.0*  HGB 12.8 12.1 9.9* 12.8  HCT 39.8 38.0 29.0* 39.6  PLT 241 202  --  175    Coag's  Recent Labs Lab 08/31/16 2149 08/31/2016 0322 09/10/2016 1223  APTT 29 29  --   INR 1.02 1.20 1.02    Sepsis Markers  Recent Labs Lab 08/31/16 2211 09/08/2016 0700  LATICACIDVEN 2.5* 1.2    ABG  Recent Labs Lab 09/02/16 1530 09/04/16 0416 08/17/2016 1259  PHART 7.525* 7.496* 7.398  PCO2ART 28.7* 35.1 53.9*  PO2ART 314* 114* 419.0*    Liver Enzymes  Recent Labs Lab 08/31/16 2149 08/23/2016 0322 09/02/16 1400  AST 22 18 25   ALT 26 21 22   ALKPHOS 197* 170* 185*  BILITOT 0.7 0.8 0.6  ALBUMIN 3.5 2.8* 2.4*     Cardiac Enzymes  Recent Labs Lab 08/31/16 2149  TROPONINI <0.03    Glucose  Recent Labs Lab 09/08/2016 1208 08/22/2016 1706 09/01/2016 2038 08/25/2016 2346 09/07/16 0408 09/07/16 0735  GLUCAP 119* 99 121* 100* 102* 137*    Imaging Ct Head Wo Contrast  Result Date: 09/07/2016 CLINICAL DATA:  65 y/o  F; cerebral abscess post re-exploration. EXAM: CT HEAD WITHOUT CONTRAST TECHNIQUE: Contiguous axial images were obtained from the base of the skull through the vertex without intravenous contrast. COMPARISON:  09/05/2016 MRI of the brain and 08/31/2016 CT of the head. FINDINGS: Brain: There is a resection cavity within the left paramedian frontal parietal lobe containing air, fluid, and high attenuation acute blood products. Traversing the resection cavity, entering the left lateral ventricle, with tip in periventricular white matter of left frontal lobe is a drainage catheter. Small acute hemorrhage within the left thalamus. There is a small amount of air within the frontal horn of left lateral ventricle. Stable isodense hematoma within the anterior left paramedian frontal lobe. Small volume of pneumocephalus over the right greater than left frontal lobes. Pneumocephalus exerts mass effect on the brain with effacement of sulci and minimal right to left midline shift. Small subdural hematoma along the falx and left tentorium cerebelli and small volume of acute subarachnoid hemorrhage overlying sulci in the region of the resection cavity. No evidence for new large acute territory infarct. Mild decrease in the lateral ventricle size. Persistent hypoattenuation in margins of the ventricles probably representing edema. Vascular: No hyperdense vessel or unexpected calcification. Skull: Left parietal craniectomy with a small amount of fluid in the resection cavity and edema in the overlying scalp. Sinuses/Orbits: No acute finding. Other: Endotracheal and enteric tubes. IMPRESSION: 1. Status post left  parietal craniectomy and resection of left frontoparietal hematoma. Small volume of acute hemorrhage within the resection cavity, subarachnoid hemorrhage of surrounding sulci, and trace subdural along posterior falx and left tentorium. 2. Drainage catheter traversing the resection cavity, entering left lateral ventricle, with tip in periventricular white matter of left frontal lobe. 3. Small new acute hemorrhage within the left thalamus. 4. Mild decrease in size of lateral ventricles. Persistent hypoattenuation of ventricular margins likely representing edema. 5. Small volume of pneumocephalus over right greater than left frontal lobes with mass effect effacing sulci and minimal right-to-left midline shift. Electronically Signed   By: Kristine Garbe M.D.   On: 09/07/2016 03:21     STUDIES:  CT head 11/17 >> Likely subarachnoid hemorrhage at the left convexity, and also at the right occipital lobe. Packing material at the left convexity,reflecting recent evacuation of a hematoma. Evolving subacute infarct at the left cerebral hemisphere,  primarily involving the white matter. CT head 11/18 >> no changes  MRI 11/18 >> Subacute parenchymal hematoma in the left cerebral hemisphere, the largest is along the left frontal parietal junction has edema, local mass effect, and measures up to 7 cm.2. Subacute hemorrhage or less likely debris in infratentorial cisterns and lateral ventricles. Mild lateral ventricularhydrocephalus when compared to 2016 comparison. Periventricular T2 hyperintensity is likely a combination of CSFre- absorption and the chronic microvascular disease that was seenin 2016.4. Superficial siderosis around the cerebral convexities, progressed from 2016  EEG 11/18 >> no seizure, global encephalopathic, mod to severe diffuse slowing.  EEG 11/21 >> no seizure, diffuse cerebral dysfunction MRI 11/23>>> 1. Diffuse leptomeningeal enhancement and incomplete FLAIR suppression of sulci over  convexities, cerebellar folia, and along the surface of the brainstem, including regions without susceptibility signal abnormality, probably represents a combination of redistribution of subarachnoid hemorrhage and meningitis. This appears progressed on the FLAIR sequence. 2. Periventricular enhancement, increasing periventricular T2 FLAIR hyperintense signal abnormality, and debris within occipital horns of ventricles without typical evolution of blood signal is suspicious for ventriculitis and ventricular empyema. 3. Stable hemorrhages within the left paramedian anterior frontal and left posterior frontal parietal lobes. Enhancement and margin of hemorrhages is nonspecific can be seen as a reaction to hemorrhage or infection. 4. Stable mild enlargement of the lateral and third ventricles probably represents a component of hydrocephalus. 5. Several punctate foci of susceptibility hypointensity in the supratentorial brain compatible with microhemorrhage are new from prior MR. 6. No acute infarct of the brain identified. No herniation or significant midline shift.  CULTURES: BC 08/31/16 >> serratia UC 11/17 >> E coli CSF culture 11/19 >> GNR >>neg BCx2 11/19 >>   ANTIBIOTICS: Merrem 11/18 >> Flagyl 11/19 >> 11/19 linazolid 11/17 >>  SIGNIFICANT EVENTS: 11/17 Transfer from Rehab to [facility] ER, then to [facility] Neuro ICU for 2 new SAH   LINES/TUBES: ETT 11/19 >>  DISCUSSION: 65 yo with recent [facility] s/p craniotomy and clot evacuation admitted with recurrent SAH .BC panel with + serration. [employer] abx added . LP is consistent with CNS infection.   ASSESSMENT / PLAN:  NEUROLOGIC A:   Bacterial Meningitis / Encephalitis - ? Related to bacteremia vs local process with recent surgical procedure, clean scalp wound Hx Left Parietal Hemorrhage EEG no seizure  Concern for worsening abscess/infection of surgical site - plan to washout and remove bone flap 11/23 P:   Monitor  mental status closely  Neuro/NS following  Avoid sedating rx  AED's per neuro  For OR 11/23   PULMONARY A: Tachypnea ?secondary neuro / infectious etiology  Intubated for airway protection P:   PRVC 8 cc/kg  Wean PEEP / FiO2 for sats > 92% PSV wean but mental status limits extubation  Intermittent CXR   CARDIOVASCULAR A:  HTN - improved.  P:  Labetalol IV PRN Hold norvasc /cozaar for now  Tele monitoring   RENAL  Recent Labs Lab 09/05/2016 1000 08/18/2016 1259 09/07/16 0444  K 3.0* 3.3* 3.7     A:   Hypokalemia  Hypophosphatemia P:   Trend BMP / UOP  K phos 11/23 Change MIVF to D5W   GASTROINTESTINAL A:   At Risk Malnutrition  P:   TF per nutrition PPI   HEMATOLOGIC A:   No active issues  P:  Trend CBC SCD's for DVT prophylaxis   INFECTIOUS A:   Bacteremia -Serratia on BC panel  ? Brain abscess source  GNR in CSF P:  Trend Temp /WBC  Follow cultures Appreciate help from ID Defer ABX to ID See neuro   ENDOCRINE CBG (last 3)   Recent Labs  09/10/2016 2346 09/07/16 0408 09/07/16 0735  GLUCAP 100* 102* 137*      Hypothyroid -tsh ok  Hyperglycemia  P:   Continue Synthroid  SSI     FAMILY  - Updates: family updated by dr cram 11/23 - Inter-disciplinary family meet or Palliative Care meeting due by:  09/08/16  CCT= 35 min   Richardson Landry Minor ACNP Maryanna Shape PCCM Pager [phone] till 3 pm If no answer page [phone] 09/07/2016, 10:18 AM  Attending note: I have seen and examined the patient with nurse practitioner/resident and agree with the note. History, labs and imaging reviewed.  S/p evacuation of intracerebral abscess Blood pressure 130/82, pulse 76, temperature 100 F (37.8 C), temperature source Oral, resp. rate 14, height 5\' 3"  (1.6 m), weight 134 lb 14.7 oz (61.2 kg), SpO2 100 %. Non responsive CVS-RRR RS- Clear Abd- Soft, + BS Ext- No edema  Labs and imaging reviewed.  Assessment and plan: 65 y/o with SAH s/p  carniotomy, clot evacuation. CNS infection, serratia in Bcx, final CSF cultures are pending.   Weaning on PSV but mental status is still poor On meropenenm and zyvox per ID. Awaitng finalization of CNS cultures.  Critical care time - 35 mins.  Marshell Garfinkel MD [facility] Pager [phone] If no answer or after 3pm call: [phone] 09/07/2016, 12:10 PM

## 2016-09-07 NOTE — Progress Notes (Signed)
Peripherally Inserted Central Catheter/Midline Placement  The IV Nurse has discussed with the patient and/or persons authorized to consent for the patient, the purpose of this procedure and the potential benefits and risks involved with this procedure.  The benefits include less needle sticks, lab draws from the catheter, and the patient may be discharged home with the catheter. Risks include, but not limited to, infection, bleeding, blood clot (thrombus formation), and puncture of an artery; nerve damage and irregular heartbeat and possibility to perform a PICC exchange if needed/ordered by physician.  Alternatives to this procedure were also discussed.  B[employer] PICC patient education guide, fact sheet on infection prevention and patient information card has been provided to patient /or left at bedside.    PICC/Midline Placement Documentation    Information given to husband , Lashayla Eure .    Holley Bouche Renee 09/07/2016, 9:20 AM

## 2016-09-07 NOTE — Progress Notes (Addendum)
Patient remained hypotensive after bolus, BP 90s. Albumin ordered by Dr. Erlinda Hong. Also CCM made aware per Dr. Erlinda Hong. BP currently 100s/50s. Orders received for labs. Albumin infusing. Will continue to monitor.   Update: normotensive post albumin and bolus. Dr. Erlinda Hong updated. Will continue to monitor.

## 2016-09-08 ENCOUNTER — Inpatient Hospital Stay (HOSPITAL_COMMUNITY): Payer: Medicare Other

## 2016-09-08 DIAGNOSIS — Z95828 Presence of other vascular implants and grafts: Secondary | ICD-10-CM

## 2016-09-08 DIAGNOSIS — Z9889 Other specified postprocedural states: Secondary | ICD-10-CM

## 2016-09-08 DIAGNOSIS — J9601 Acute respiratory failure with hypoxia: Secondary | ICD-10-CM

## 2016-09-08 DIAGNOSIS — G8190 Hemiplegia, unspecified affecting unspecified side: Secondary | ICD-10-CM

## 2016-09-08 DIAGNOSIS — A498 Other bacterial infections of unspecified site: Secondary | ICD-10-CM

## 2016-09-08 DIAGNOSIS — Z8679 Personal history of other diseases of the circulatory system: Secondary | ICD-10-CM

## 2016-09-08 LAB — CBC
HCT: 27.8 % — ABNORMAL LOW (ref 36.0–46.0)
Hemoglobin: 9.3 g/dL — ABNORMAL LOW (ref 12.0–15.0)
MCH: 32 pg (ref 26.0–34.0)
MCHC: 33.5 g/dL (ref 30.0–36.0)
MCV: 95.5 fL (ref 78.0–100.0)
PLATELETS: 173 10*3/uL (ref 150–400)
RBC: 2.91 MIL/uL — AB (ref 3.87–5.11)
RDW: 13.3 % (ref 11.5–15.5)
WBC: 10.2 10*3/uL (ref 4.0–10.5)

## 2016-09-08 LAB — GLUCOSE, CAPILLARY
GLUCOSE-CAPILLARY: 111 mg/dL — AB (ref 65–99)
Glucose-Capillary: 144 mg/dL — ABNORMAL HIGH (ref 65–99)
Glucose-Capillary: 115 mg/dL — ABNORMAL HIGH (ref 65–99)
Glucose-Capillary: 146 mg/dL — ABNORMAL HIGH (ref 65–99)
Glucose-Capillary: 131 mg/dL — ABNORMAL HIGH (ref 65–99)
Glucose-Capillary: 150 mg/dL — ABNORMAL HIGH (ref 65–99)

## 2016-09-08 LAB — BASIC METABOLIC PANEL
ANION GAP: 7 (ref 5–15)
BUN: 16 mg/dL (ref 6–20)
CALCIUM: 7.6 mg/dL — AB (ref 8.9–10.3)
CO2: 23 mmol/L (ref 22–32)
Chloride: 113 mmol/L — ABNORMAL HIGH (ref 101–111)
Creatinine, Ser: 0.46 mg/dL (ref 0.44–1.00)
GLUCOSE: 162 mg/dL — AB (ref 65–99)
POTASSIUM: 2.7 mmol/L — AB (ref 3.5–5.1)
SODIUM: 143 mmol/L (ref 135–145)

## 2016-09-08 LAB — SEDIMENTATION RATE: SED RATE: 67 mm/h — AB (ref 0–22)

## 2016-09-08 LAB — PHOSPHORUS: PHOSPHORUS: 2 mg/dL — AB (ref 2.5–4.6)

## 2016-09-08 LAB — C-REACTIVE PROTEIN: CRP: 8.4 mg/dL — AB (ref <1.0)

## 2016-09-08 LAB — MAGNESIUM: Magnesium: 1.8 mg/dL (ref 1.7–2.4)

## 2016-09-08 IMAGING — XA DG C-ARM 61-120 MIN
3 series · 3 of 3 positions shown · non-contrast
Comparison: CT of the right knee 05/10/2016

CLINICAL DATA: Surgical assistance.

EXAM:
DG C-ARM 61-120 MIN; RIGHT KNEE - 1-2 VIEW
FLUOROSCOPY TIME:  Fluoroscopy Time (in minutes and seconds):
Number of Acquired Images:  3
3.82 uGy*m2

[Series 1: ortho standard · 1 of 1 slices shown (1 of 3)]
[im 1/1]
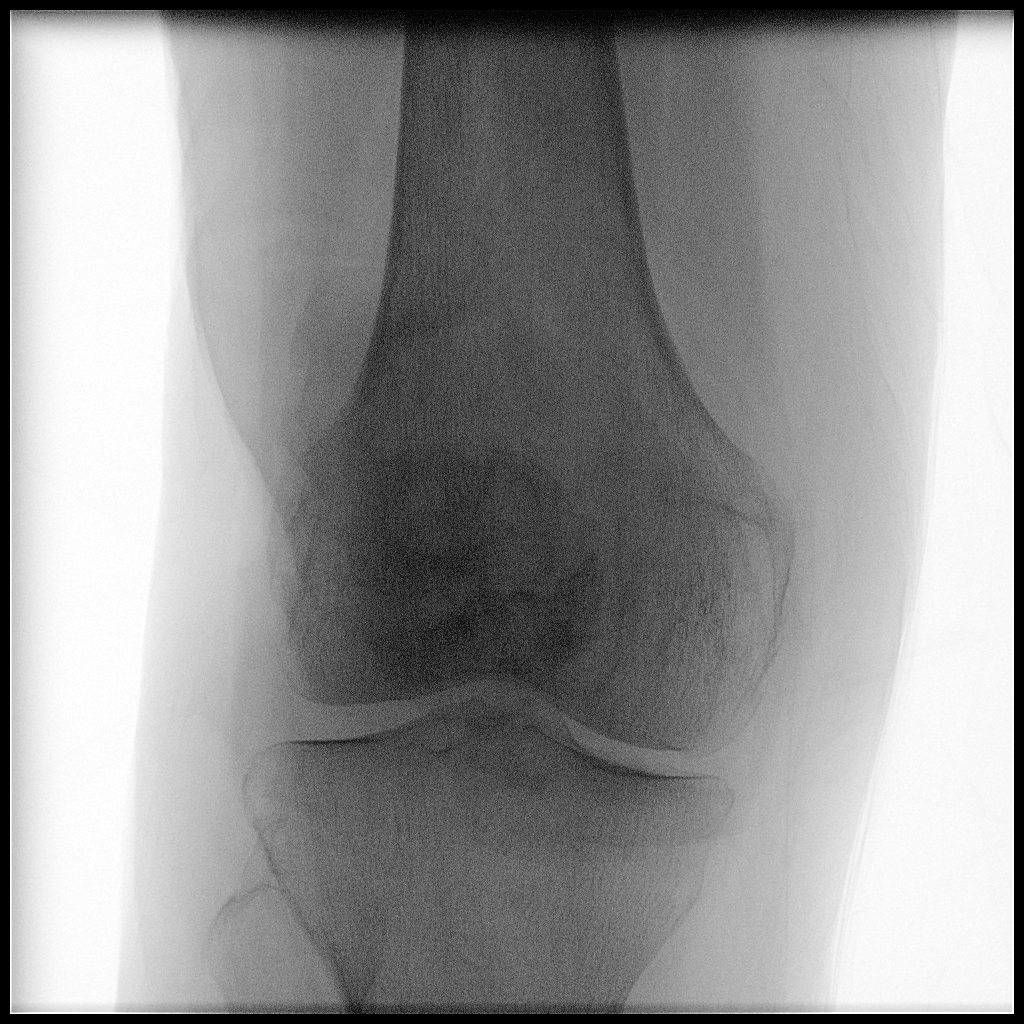

[Series 2: ortho standard · 1 of 1 slices shown (2 of 3)]
[im 1/1]
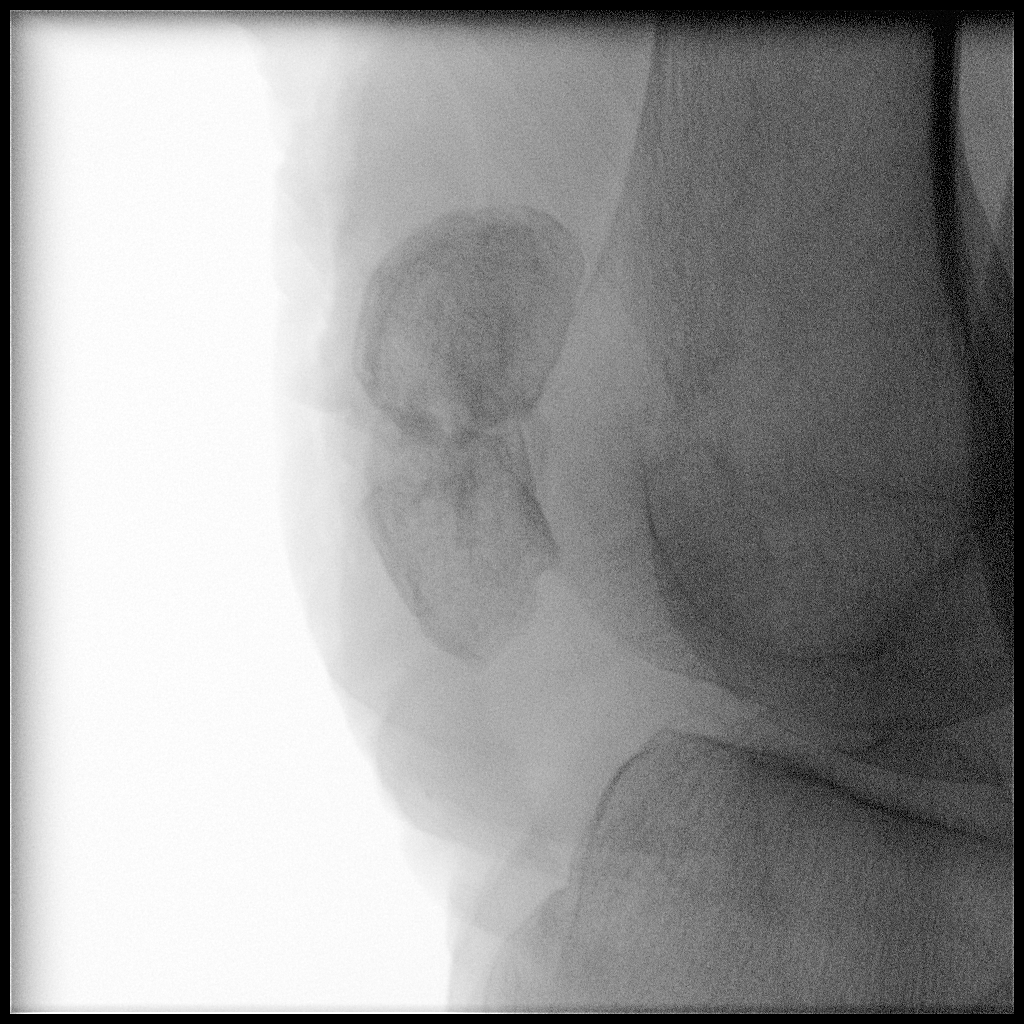

[Series 3: ortho standard · 1 of 1 slices shown (3 of 3)]
[im 1/1]
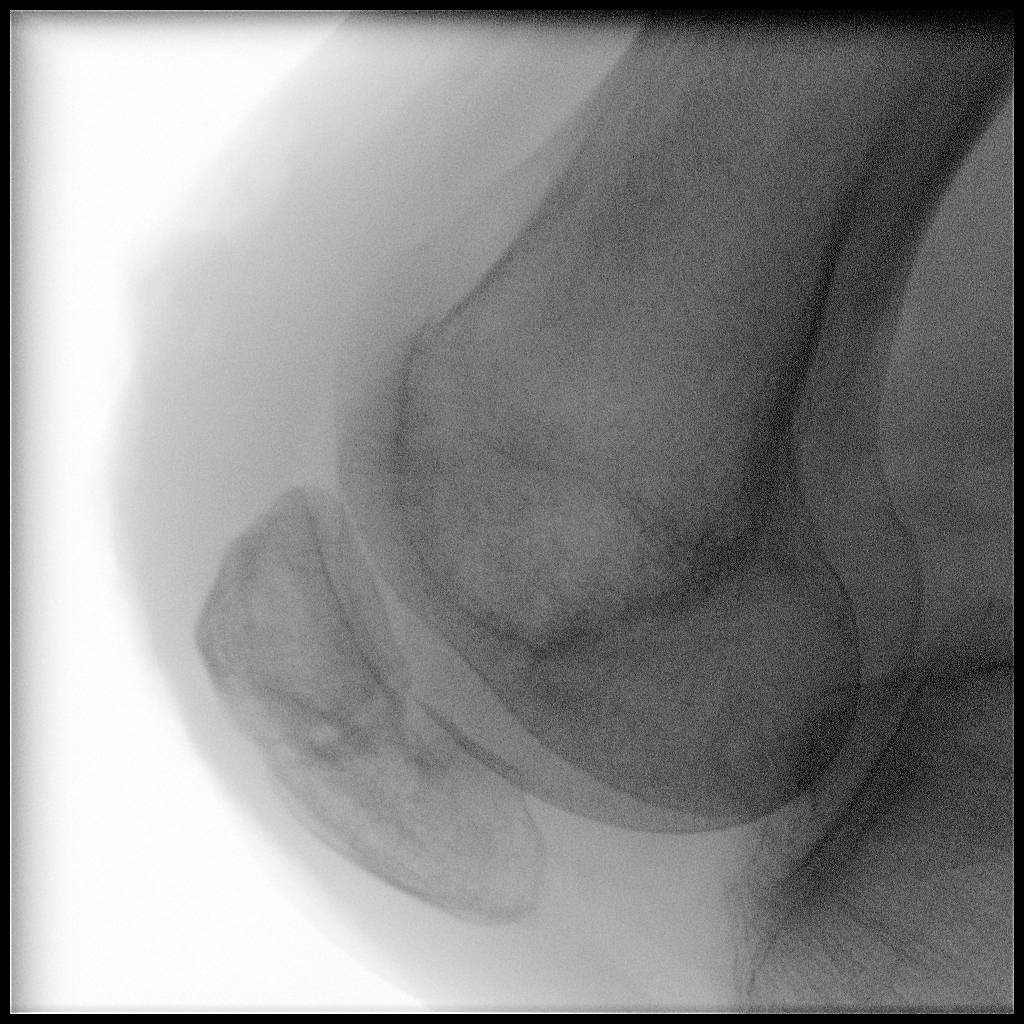

[3 of 3 positions shown; findings below may reference images not displayed]

FINDINGS: Three intraoperative images demonstrate removal of K-wires and
cerclage wires. The patella is intact. Bridging bone is evident. The
knee is located.
IMPRESSION: Interval removal of K-wires and cerclage wires without radiographic
evidence for complication.

## 2016-09-08 MED ORDER — DEXTROSE 5 % IV SOLN
2.0000 g | Freq: Three times a day (TID) | INTRAVENOUS | Status: DC
  Administered 2016-09-08 – 2016-09-12 (×12): 2 g via INTRAVENOUS
  Filled 2016-09-08 (×14): qty 2

## 2016-09-08 MED ORDER — FENTANYL CITRATE (PF) 100 MCG/2ML IJ SOLN
25.0000 ug | INTRAMUSCULAR | Status: DC | PRN
  Administered 2016-09-09 – 2016-09-11 (×4): 50 ug via INTRAVENOUS
  Filled 2016-09-08 (×4): qty 2

## 2016-09-08 MED ORDER — CHLORHEXIDINE GLUCONATE 0.12 % MT SOLN
OROMUCOSAL | Status: AC
  Filled 2016-09-08: qty 15

## 2016-09-08 MED ORDER — SODIUM CHLORIDE 0.9 % IV SOLN
30.0000 meq | Freq: Once | INTRAVENOUS | Status: AC
  Administered 2016-09-08: 30 meq via INTRAVENOUS
  Filled 2016-09-08: qty 15

## 2016-09-08 MED ORDER — METRONIDAZOLE IN NACL 5-0.79 MG/ML-% IV SOLN
500.0000 mg | Freq: Four times a day (QID) | INTRAVENOUS | Status: DC
  Administered 2016-09-08 – 2016-09-12 (×16): 500 mg via INTRAVENOUS
  Filled 2016-09-08 (×18): qty 100

## 2016-09-08 MED ORDER — DEXTROSE 5 % IV SOLN
30.0000 mmol | Freq: Once | INTRAVENOUS | Status: AC
  Administered 2016-09-08: 30 mmol via INTRAVENOUS
  Filled 2016-09-08: qty 10

## 2016-09-08 MED ORDER — MAGNESIUM SULFATE 2 GM/50ML IV SOLN
2.0000 g | Freq: Once | INTRAVENOUS | Status: AC
  Administered 2016-09-08: 2 g via INTRAVENOUS
  Filled 2016-09-08: qty 50

## 2016-09-08 NOTE — Progress Notes (Signed)
CRITICAL VALUE ALERT  Critical value received:  K+ 2.7  Date of notification:  09/08/2016     Time of notification: 0615   Critical value read back:Yes.    Nurse who received alert:  Beacher May, RN  MD notified (1st page):  Dr. Johnette Abraham Deterding  Time of first page:  0615  MD notified (2nd page):  Time of second page:  Responding MD:  Dr. Johnette Abraham Deterding  Time MD responded:  [phone]  New orders received.

## 2016-09-08 NOTE — Progress Notes (Addendum)
Pt had brief run of SVT followed by appx. 30 seconds of rhythmic twitching of left shoulder just after turning.  Neuro status otherwise unchanged.  Dr. Jaynee Eagles notified.

## 2016-09-08 NOTE — Progress Notes (Signed)
Subjective:  The patient minimally responsive, hemiplegic   Antibiotics:  Anti-infectives    Start     Dose/Rate Route Frequency Ordered Stop   09/08/16 1400  metroNIDAZOLE (FLAGYL) IVPB 500 mg     500 mg 100 mL/hr over 60 Minutes Intravenous Every 6 hours 09/08/16 1333     09/08/16 1400  cefTAZidime (FORTAZ) 2 g in dextrose 5 % 50 mL IVPB     2 g 100 mL/hr over 30 Minutes Intravenous Every 8 hours 09/08/16 1334     09/07/16 1600  meropenem (MERREM) 2 g in sodium chloride 0.9 % 100 mL IVPB  Status:  Discontinued     2 g 200 mL/hr over 30 Minutes Intravenous Every 8 hours 09/07/16 1519 09/08/16 1333   08/19/2016 1348  bacitracin 50,000 Units in sodium chloride irrigation 0.9 % 500 mL irrigation  Status:  Discontinued       As needed 09/13/2016 1348 09/13/2016 1415   09/05/16 1400  cefTAZidime (FORTAZ) 2 g in dextrose 5 % 50 mL IVPB  Status:  Discontinued     2 g 100 mL/hr over 30 Minutes Intravenous Every 8 hours 09/05/16 1356 09/07/16 1510   09/02/16 1600  meropenem (MERREM) 2 g in sodium chloride 0.9 % 100 mL IVPB  Status:  Discontinued     2 g 200 mL/hr over 30 Minutes Intravenous Every 8 hours 09/02/16 1532 09/05/16 1346   09/02/16 1300  linezolid (ZYVOX) IVPB 600 mg  Status:  Discontinued     600 mg 300 mL/hr over 60 Minutes Intravenous Every 12 hours 09/02/16 1158 09/05/16 1346   09/02/16 0930  metroNIDAZOLE (FLAGYL) IVPB 500 mg  Status:  Discontinued     500 mg 100 mL/hr over 60 Minutes Intravenous Every 6 hours 09/02/16 0905 09/02/16 1421   08/31/2016 1500  meropenem (MERREM) 2 g in sodium chloride 0.9 % 100 mL IVPB  Status:  Discontinued     2 g 200 mL/hr over 30 Minutes Intravenous Every 12 hours 09/05/2016 1428 09/02/16 1532   08/19/2016 1300  ciprofloxacin (CIPRO) IVPB 400 mg  Status:  Discontinued     400 mg 200 mL/hr over 60 Minutes Intravenous Every 12 hours 09/10/2016 1230 09/07/2016 1428      Medications: Scheduled Meds: . aspirin  81 mg Oral Daily  .  cefTAZidime (FORTAZ)  IV  2 g Intravenous Q8H  . chlorhexidine gluconate (MEDLINE KIT)  15 mL Mouth Rinse BID  . heparin subcutaneous  5,000 Units Subcutaneous Q8H  . insulin aspart  2-6 Units Subcutaneous Q4H  . levETIRAcetam  1,000 mg Intravenous Q12H  . levothyroxine  25 mcg Per Tube QAC breakfast  . mouth rinse  15 mL Mouth Rinse 10 times per day  . metronidazole  500 mg Intravenous Q6H  . pantoprazole (PROTONIX) IV  40 mg Intravenous QHS  . senna-docusate  1 tablet Oral BID  . sodium chloride flush  10-40 mL Intracatheter Q12H  . cyanocobalamin  100 mcg Oral Daily   Continuous Infusions: . dextrose 5 % and 0.45 % NaCl with KCl 20 mEq/L 75 mL/hr at 09/08/16 1700  . dextrose Stopped (09/08/16 0800)  . feeding supplement (VITAL AF 1.2 CAL) 1,000 mL (09/08/16 1700)  . propofol (DIPRIVAN) infusion Stopped (09/07/16 1130)   PRN Meds:.acetaminophen **OR** acetaminophen, fentaNYL (SUBLIMAZE) injection, labetalol, LORazepam, ondansetron **OR** ondansetron (ZOFRAN) IV, promethazine, sodium chloride flush, sodium chloride flush    Objective: Weight change: 5 lb 11.7 oz (2.6 kg)  Intake/Output Summary (Last 24 hours) at 09/08/16 1724 Last data filed at 09/08/16 1700  Gross per 24 hour  Intake           6652.5 ml  Output             3378 ml  Net           3274.5 ml   Blood pressure 132/73, pulse 89, temperature (!) 100.4 F (38 C), temperature source Axillary, resp. rate (!) 22, height _0  (1.6 m), weight 140 lb 10.5 oz (63.8 kg), SpO2 100 %. Temp:  [98.6 F (37 C)-101.1 F (38.4 C)] 100.4 F (38 C) (11/25 1554) Pulse Rate:  [77-107] 89 (11/25 1700) Resp:  [16-35] 22 (11/25 1700) BP: (71-176)/(31-83) 132/73 (11/25 1700) SpO2:  [100 %] 100 % (11/25 1700) Arterial Line BP: (89-190)/(50-83) 141/60 (11/25 1700) FiO2 (%):  [30 %] 30 % (11/25 1700) Weight:  [140 lb 10.5 oz (63.8 kg)] 140 lb 10.5 oz (63.8 kg) (11/25 0500)  Physical Exam: General: not responsive to voive when I  examined her  HEENT: EOMI, ventricular catheter in place CVS regular rate, normal r,  no murmur rubs or gallops Chest: FAIRLY clear to auscultation bilaterally, no wheezing, rales or rhonchi Abdomen: soft  nondistended, normal bowel sounds, Extremities: no  clubbing or edema noted bilaterally Skin: no rashes Lymph: no new lymphadenopathy Neuro: nonfocal  CBC:  CBC Latest Ref Rng & Units 09/08/2016 09/07/2016 08/20/2016  WBC 4.0 - 10.5 K/uL 10.2 12.0(H) -  Hemoglobin 12.0 - 15.0 g/dL 9.3(L) 12.8 9.9(L)  Hematocrit 36.0 - 46.0 % 27.8(L) 39.6 29.0(L)  Platelets 150 - 400 K/uL 173 175 -      BMET  Recent Labs  09/07/16 0444 09/08/16 0500  NA 149* 143  K 3.7 2.7*  CL 111 113*  CO2 27 23  GLUCOSE 123* 162*  BUN 14 16  CREATININE 0.57 0.46  CALCIUM 8.4* 7.6*     Liver Panel  No results for input(s): PROT, ALBUMIN, AST, ALT, ALKPHOS, BILITOT, BILIDIR, IBILI in the last 72 hours.     Sedimentation Rate  Recent Labs  09/08/16 0500  ESRSEDRATE 67*   C-Reactive Protein  Recent Labs  09/08/16 0500  CRP 8.4*    Micro Results: Recent Results (from the past 720 hour(s))  Urine culture     Status: Abnormal   Collection Time: 08/31/16 10:11 PM  Result Value Ref Range Status   Specimen Description URINE, RANDOM  Final   Special Requests NONE  Final   Culture (A)  Final    >=100,000 COLONIES/mL ESCHERICHIA COLI 60,000 COLONIES/mL ENTEROCOCCUS FAECALIS    Report Status 09/04/2016 FINAL  Final   Organism ID, Bacteria ESCHERICHIA COLI (A)  Final   Organism ID, Bacteria ENTEROCOCCUS FAECALIS (A)  Final      Susceptibility   Escherichia coli - MIC*    AMPICILLIN <=2 SENSITIVE Sensitive     CEFAZOLIN <=4 SENSITIVE Sensitive     CEFTRIAXONE <=1 SENSITIVE Sensitive     CIPROFLOXACIN <=0.25 SENSITIVE Sensitive     GENTAMICIN <=1 SENSITIVE Sensitive     IMIPENEM 0.5 SENSITIVE Sensitive     NITROFURANTOIN 32 SENSITIVE Sensitive     TRIMETH/SULFA <=20 SENSITIVE  Sensitive     AMPICILLIN/SULBACTAM <=2 SENSITIVE Sensitive     PIP/TAZO <=4 SENSITIVE Sensitive     Extended ESBL NEGATIVE Sensitive     * >=100,000 COLONIES/mL ESCHERICHIA COLI   Enterococcus faecalis - MIC*    AMPICILLIN <=2 SENSITIVE Sensitive  LEVOFLOXACIN 1 SENSITIVE Sensitive     NITROFURANTOIN <=16 SENSITIVE Sensitive     VANCOMYCIN 1 SENSITIVE Sensitive     * 60,000 COLONIES/mL ENTEROCOCCUS FAECALIS  Blood Culture (routine x 2)     Status: Abnormal   Collection Time: 08/31/16 10:15 PM  Result Value Ref Range Status   Specimen Description BLOOD RIGHT ASSIST CONTROL  Final   Special Requests   Final    BOTTLES DRAWN AEROBIC AND ANAEROBIC 14CCAERO,13CCANA   Culture  Setup Time   Final    GRAM NEGATIVE RODS IN BOTH AEROBIC AND ANAEROBIC BOTTLES CRITICAL RESULT CALLED TO, READ BACK BY AND VERIFIED WITH: HOLLY GILLIAM ON 08/21/2016 AT 39 BY [facility] Performed at [facility] (A)  Final   Report Status 09/03/2016 FINAL  Final   Organism ID, Bacteria SERRATIA MARCESCENS  Final      Susceptibility   Serratia marcescens - MIC*    CEFAZOLIN >=64 RESISTANT Resistant     CEFEPIME <=1 SENSITIVE Sensitive     CEFTAZIDIME <=1 SENSITIVE Sensitive     CEFTRIAXONE <=1 SENSITIVE Sensitive     CIPROFLOXACIN <=0.25 SENSITIVE Sensitive     GENTAMICIN <=1 SENSITIVE Sensitive     TRIMETH/SULFA <=20 SENSITIVE Sensitive     * SERRATIA MARCESCENS  Blood Culture ID Panel (Reflexed)     Status: Abnormal   Collection Time: 08/31/16 10:15 PM  Result Value Ref Range Status   Enterococcus species NOT DETECTED NOT DETECTED Final   Listeria monocytogenes NOT DETECTED NOT DETECTED Final   Staphylococcus species NOT DETECTED NOT DETECTED Final   Staphylococcus aureus NOT DETECTED NOT DETECTED Final   Streptococcus species NOT DETECTED NOT DETECTED Final   Streptococcus agalactiae NOT DETECTED NOT DETECTED Final   Streptococcus pneumoniae NOT DETECTED NOT DETECTED  Final   Streptococcus pyogenes NOT DETECTED NOT DETECTED Final   Acinetobacter baumannii NOT DETECTED NOT DETECTED Final   Enterobacteriaceae species DETECTED (A) NOT DETECTED Final    Comment: CRITICAL RESULT CALLED TO, READ BACK BY AND VERIFIED WITH: HOLLY GILLIAM ON 08/25/2016 AT 1401 BY [facility]    Enterobacter cloacae complex NOT DETECTED NOT DETECTED Final   Escherichia coli NOT DETECTED NOT DETECTED Final   Klebsiella oxytoca NOT DETECTED NOT DETECTED Final   Klebsiella pneumoniae NOT DETECTED NOT DETECTED Final   Proteus species NOT DETECTED NOT DETECTED Final   Serratia marcescens DETECTED (A) NOT DETECTED Final    Comment: CRITICAL RESULT CALLED TO, READ BACK BY AND VERIFIED WITH: HOLLY GILLIAM ON 09/10/2016 AT 1401 BY [facility]    Carbapenem resistance NOT DETECTED NOT DETECTED Final   Haemophilus influenzae NOT DETECTED NOT DETECTED Final   Neisseria meningitidis NOT DETECTED NOT DETECTED Final   Pseudomonas aeruginosa NOT DETECTED NOT DETECTED Final   Candida albicans NOT DETECTED NOT DETECTED Final   Candida glabrata NOT DETECTED NOT DETECTED Final   Candida krusei NOT DETECTED NOT DETECTED Final   Candida parapsilosis NOT DETECTED NOT DETECTED Final   Candida tropicalis NOT DETECTED NOT DETECTED Final  Blood Culture (routine x 2)     Status: Abnormal   Collection Time: 08/31/16 10:33 PM  Result Value Ref Range Status   Specimen Description BLOOD RIGHT ARM  Final   Special Requests   Final    BOTTLES DRAWN AEROBIC AND ANAEROBIC 11CCAERO,12CCANA   Culture  Setup Time   Final    GRAM NEGATIVE RODS IN BOTH AEROBIC AND ANAEROBIC BOTTLES CRITICAL VALUE NOTED.  VALUE IS CONSISTENT  WITH PREVIOUSLY REPORTED AND CALLED VALUE.    Culture (A)  Final    SERRATIA MARCESCENS SUSCEPTIBILITIES PERFORMED ON PREVIOUS CULTURE WITHIN THE LAST 5 DAYS. Performed at [facility] [place]    Report Status 09/04/2016 FINAL  Final  MRSA PCR Screening     Status: None   Collection Time: 08/15/2016  5:19  AM  Result Value Ref Range Status   MRSA by PCR NEGATIVE NEGATIVE Final    Comment:        The GeneXpert MRSA Assay (FDA approved for NASAL specimens only), is one component of a comprehensive MRSA colonization surveillance program. It is not intended to diagnose MRSA infection nor to guide or monitor treatment for MRSA infections.   Culture, Urine     Status: Abnormal   Collection Time: 09/02/2016  9:58 AM  Result Value Ref Range Status   Specimen Description URINE, CATHETERIZED  Final   Special Requests NONE  Final   Culture MULTIPLE SPECIES PRESENT, SUGGEST RECOLLECTION (A)  Final   Report Status 09/02/2016 FINAL  Final  CSF culture     Status: None   Collection Time: 09/02/16  1:42 PM  Result Value Ref Range Status   Specimen Description CSF  Final   Special Requests NONE  Final   Gram Stain   Final    WBC PRESENT, PREDOMINANTLY PMN GRAM NEGATIVE RODS CRITICAL RESULT CALLED TO, READ BACK BY AND VERIFIED WITH: DR. Yvetta Coder AT 1530 ON [id number] BY S. YARBROUGH    Culture NO GROWTH 3 DAYS  Final   Report Status 09/05/2016 FINAL  Final  Culture, fungus without smear     Status: None (Preliminary result)   Collection Time: 09/02/16  1:42 PM  Result Value Ref Range Status   Specimen Description CSF  Final   Special Requests NONE  Final   Culture NO FUNGUS ISOLATED AFTER 3 DAYS  Final   Report Status PENDING  Incomplete  Anaerobic culture     Status: None   Collection Time: 09/02/16  1:42 PM  Result Value Ref Range Status   Specimen Description CSF  Final   Special Requests NONE  Final   Culture NO ANAEROBES ISOLATED  Final   Report Status 09/07/2016 FINAL  Final  Culture, blood (routine x 2)     Status: None   Collection Time: 09/02/16  2:14 PM  Result Value Ref Range Status   Specimen Description BLOOD RIGHT HAND  Final   Special Requests IN PEDIATRIC BOTTLE [place]  Final   Culture NO GROWTH 5 DAYS  Final   Report Status 09/07/2016 FINAL  Final  Culture, blood (routine x 2)      Status: None   Collection Time: 09/02/16  2:15 PM  Result Value Ref Range Status   Specimen Description BLOOD LEFT HAND  Final   Special Requests IN PEDIATRIC BOTTLE 2.5CC  Final   Culture NO GROWTH 5 DAYS  Final   Report Status 09/07/2016 FINAL  Final  Urine culture     Status: None   Collection Time: 08/15/2016  9:35 AM  Result Value Ref Range Status   Specimen Description URINE, CATHETERIZED  Final   Special Requests NONE  Final   Culture NO GROWTH  Final   Report Status 09/07/2016 FINAL  Final  Anaerobic culture     Status: None (Preliminary result)   Collection Time: 09/11/2016  1:11 PM  Result Value Ref Range Status   Specimen Description WOUND  Final   Special Requests  Final    SUBGALEAL SPACE SPECIMEN A PATIENT ON FOLLOWING  CEFTAZ   Culture   Final    NO ANAEROBES ISOLATED; CULTURE IN PROGRESS FOR 5 DAYS   Report Status PENDING  Incomplete  Aerobic Culture (superficial specimen)     Status: None (Preliminary result)   Collection Time: 09/05/2016  1:11 PM  Result Value Ref Range Status   Specimen Description WOUND  Final   Special Requests   Final    SUBGALEAL SPACE SPECIMEN A PATIENT ON FOLLOWING  CEFTAZ   Gram Stain   Final    FEW WBC PRESENT, PREDOMINANTLY PMN NO ORGANISMS SEEN    Culture   Final    RARE SERRATIA MARCESCENS SUSCEPTIBILITIES PERFORMED ON PREVIOUS CULTURE WITHIN THE LAST 5 DAYS. FEW YEAST CULTURE REINCUBATED FOR BETTER GROWTH    Report Status PENDING  Incomplete  Aerobic/Anaerobic Culture (surgical/deep wound)     Status: None (Preliminary result)   Collection Time: 09/05/2016  1:13 PM  Result Value Ref Range Status   Specimen Description WOUND ABSCESS  Final   Special Requests   Final    EPIDURAL SPACE SPECIMEN B PATIENT ON FOLLOWING  CEFTAZ   Gram Stain   Final    FEW WBC PRESENT, PREDOMINANTLY PMN NO ORGANISMS SEEN    Culture   Final    FEW SERRATIA MARCESCENS NO ANAEROBES ISOLATED; CULTURE IN PROGRESS FOR 5 DAYS    Report Status  PENDING  Incomplete   Organism ID, Bacteria SERRATIA MARCESCENS  Final      Susceptibility   Serratia marcescens - MIC*    CEFAZOLIN >=64 RESISTANT Resistant     CEFEPIME <=1 SENSITIVE Sensitive     CEFTAZIDIME <=1 SENSITIVE Sensitive     CEFTRIAXONE <=1 SENSITIVE Sensitive     CIPROFLOXACIN <=0.25 SENSITIVE Sensitive     GENTAMICIN <=1 SENSITIVE Sensitive     TRIMETH/SULFA <=20 SENSITIVE Sensitive     * FEW SERRATIA MARCESCENS  Aerobic/Anaerobic Culture (surgical/deep wound)     Status: None (Preliminary result)   Collection Time: 08/30/2016  1:36 PM  Result Value Ref Range Status   Specimen Description ABSCESS FLUID  Final   Special Requests   Final    INTERCEREBRAL ABSCESS FLUID SPECIMEN C PATIENT ON FOLLOWING  CEFTAZ   Gram Stain   Final    MODERATE WBC PRESENT, PREDOMINANTLY PMN RARE GRAM NEGATIVE RODS    Culture   Final    MODERATE SERRATIA MARCESCENS NO ANAEROBES ISOLATED; CULTURE IN PROGRESS FOR 5 DAYS    Report Status PENDING  Incomplete   Organism ID, Bacteria SERRATIA MARCESCENS  Final      Susceptibility   Serratia marcescens - MIC*    CEFAZOLIN >=64 RESISTANT Resistant     CEFEPIME <=1 SENSITIVE Sensitive     CEFTAZIDIME <=1 SENSITIVE Sensitive     CEFTRIAXONE <=1 SENSITIVE Sensitive     CIPROFLOXACIN <=0.25 SENSITIVE Sensitive     GENTAMICIN <=1 SENSITIVE Sensitive     TRIMETH/SULFA <=20 SENSITIVE Sensitive     * MODERATE SERRATIA MARCESCENS  Culture, blood (routine x 2)     Status: None (Preliminary result)   Collection Time: 09/07/16  3:29 PM  Result Value Ref Range Status   Specimen Description BLOOD RIGHT FOOT  Final   Special Requests BOTTLES DRAWN AEROBIC AND ANAEROBIC 5CC  Final   Culture NO GROWTH < 24 HOURS  Final   Report Status PENDING  Incomplete  Culture, blood (routine x 2)     Status: None (  Preliminary result)   Collection Time: 09/07/16  3:29 PM  Result Value Ref Range Status   Specimen Description BLOOD PICC LINE  Final   Special Requests  BOTTLES DRAWN AEROBIC ONLY 5CC  Final   Culture NO GROWTH < 24 HOURS  Final   Report Status PENDING  Incomplete    Studies/Results: Ct Head Wo Contrast  Result Date: 09/07/2016 CLINICAL DATA:  65 y/o  F; cerebral abscess post re-exploration. EXAM: CT HEAD WITHOUT CONTRAST TECHNIQUE: Contiguous axial images were obtained from the base of the skull through the vertex without intravenous contrast. COMPARISON:  09/05/2016 MRI of the brain and 08/30/2016 CT of the head. FINDINGS: Brain: There is a resection cavity within the left paramedian frontal parietal lobe containing air, fluid, and high attenuation acute blood products. Traversing the resection cavity, entering the left lateral ventricle, with tip in periventricular white matter of left frontal lobe is a drainage catheter. Small acute hemorrhage within the left thalamus. There is a small amount of air within the frontal horn of left lateral ventricle. Stable isodense hematoma within the anterior left paramedian frontal lobe. Small volume of pneumocephalus over the right greater than left frontal lobes. Pneumocephalus exerts mass effect on the brain with effacement of sulci and minimal right to left midline shift. Small subdural hematoma along the falx and left tentorium cerebelli and small volume of acute subarachnoid hemorrhage overlying sulci in the region of the resection cavity. No evidence for new large acute territory infarct. Mild decrease in the lateral ventricle size. Persistent hypoattenuation in margins of the ventricles probably representing edema. Vascular: No hyperdense vessel or unexpected calcification. Skull: Left parietal craniectomy with a small amount of fluid in the resection cavity and edema in the overlying scalp. Sinuses/Orbits: No acute finding. Other: Endotracheal and enteric tubes. IMPRESSION: 1. Status post left parietal craniectomy and resection of left frontoparietal hematoma. Small volume of acute hemorrhage within the  resection cavity, subarachnoid hemorrhage of surrounding sulci, and trace subdural along posterior falx and left tentorium. 2. Drainage catheter traversing the resection cavity, entering left lateral ventricle, with tip in periventricular white matter of left frontal lobe. 3. Small new acute hemorrhage within the left thalamus. 4. Mild decrease in size of lateral ventricles. Persistent hypoattenuation of ventricular margins likely representing edema. 5. Small volume of pneumocephalus over right greater than left frontal lobes with mass effect effacing sulci and minimal right-to-left midline shift. Electronically Signed   By: Kristine Garbe M.D.   On: 09/07/2016 03:21   Dg Chest Port 1 View  Result Date: 09/08/2016 CLINICAL DATA:  Respiratory failure EXAM: PORTABLE CHEST 1 VIEW COMPARISON:  08/26/2016 FINDINGS: Endotracheal tube tip 3.7 cm above the carina. Right PICC line tip: SVC. Feeding tube extends down into the stomach. Atherosclerotic calcification of the aortic arch. Heart size within normal limits. The lungs appear clear. Capsular calcification of breast implants. IMPRESSION: 1. Tubes and lines are in satisfactorily position. There is a new right-sided PICC line with tip projecting over the SVC. 2. Atherosclerotic calcification of the aortic arch. Electronically Signed   By: Van Clines M.D.   On: 09/08/2016 08:58      Assessment/Plan:  INTERVAL HISTORY:   The patient's MRI done the 22nd showed significant worsening of her intracranial infection and she is now status post return to the operating room and redo exploration with craniotomy and evacuation of intracerebral abscess and placing of drain into the left frontoparietal ventricular area.  Cultures have been obtained Gram stain is showing gram-negative  rods again  Sensitive Serratia growing from cxs  Active Problems:   Subarachnoid (nontraumatic) hemorrhage of newborn   Subarachnoid hemorrhage (HCC)   Superficial  siderosis of central nervous system   Bacteremia   Brain abscess   Acute respiratory failure (HCC)   Bacterial meningitis   Endotracheally intubated   History of intracranial hemorrhage   History of craniotomy   Acute respiratory failure with hypoxia (HCC)    Sydney Coleman is a 65 y.o. 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 LP c/w with CNS infection and has serratia bloodstream infection,  MRI looked worse is taken back to the operating room by Dr. Saintclair Halsted for redo craniotomy which exposed multiple areas of purulence in the brain as well as in the ventricle status post placement of frontoparietal ventricular drain. Cultures from the intracerebral abscess or showing gram-negative rods.  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 Penicillins Shortness Of Breath    Has patient had a PCN reaction causing immediate rash, facial/tongue/throat swelling, SOB or lightheadedness with hypotension: Yes Has patient had a PCN reaction causing severe rash involving mucus membranes or skin necrosis: No Has patient had a PCN reaction that required hospitalization Yes Has patient had a PCN reaction occurring within the last 10 years: Yes If all of the above answers are "NO", then may proceed with Cephalosporin use.   Marland Kitchen Amoxil [Amoxicillin] Rash    Has patient had a PCN reaction causing immediate rash, facial/tongue/throat swelling, SOB or lightheadedness with hypotension: Yes Has patient had a PCN reaction causing severe rash involving mucus membranes or skin  necrosis: Yes Has patient had a PCN reaction that required hospitalization No Has patient had a PCN reaction occurring within the last 10 years: Yes If all of the above answers are "NO", then may proceed with Cephalosporin use.   . Augmentin [Amoxicillin-Pot Clavulanate] Rash    Has patient had a PCN reaction causing immediate rash, facial/tongue/throat swelling, SOB or lightheadedness with hypotension: Yes Has patient had a PCN reaction causing severe rash involving mucus membranes or skin necrosis: Yes Has patient had a PCN reaction that required hospitalization No Has patient had a PCN reaction occurring within the last 10 years: Yes If all of the above answers are "NO", then may proceed with Cephalosporin use.   . Other Rash    Elastic  . Vancomycin Rash    Discharge antibiotics: CEFTAZIDIME 2G IV Q 8 HRS+ FLAGYL 500 MG Q 6 HOURS   Duration: 8 WEEKS  End Date: Nov 01, 2016  [facility] Care Per Protocol:  Labs weekly while on IV antibiotics: _X_ CBC with differential _X_ BMP  _X_ CRP X__ ESR __ Vancomycin trough   _X_ Please pull PIC at completion of IV antibiotics __ Please leave PIC in place until doctor has seen patient or been notified  Fax weekly labs to [phone]  Clinic Follow Up Appt: NEXT 4 WEEKS  I will sign out for now please call w further questions.  LOS: 7 days   Alcide Evener 09/08/2016, 5:24 PM

## 2016-09-08 NOTE — Progress Notes (Signed)
STROKE TEAM PROGRESS NOTE   SUBJECTIVE (INTERVAL HISTORY) Family is at the bedside. ID is following. Patient hypotensive overnight requiring IV bolus. NSG is managing drain. Electroytes replaced this morning, potassium, phos and mag. Patient is arousable, opens eyes, blinks to threat, not following commands, she is intubated.   Had discussion with Sydney Coleman this morning. She underwent I&D yesterday with Sydney Coleman, repeat CT today showed good evacuation and misplaced EVD which has been pulled back. Tmax  100F overnight. On low dose propofol.    OBJECTIVE Temp:  [98.6 F (37 C)-101.2 F (38.4 C)] 98.7 F (37.1 C) (11/25 0400) Pulse Rate:  [73-104] 87 (11/25 0700) Cardiac Rhythm: Normal sinus rhythm (11/24 2000) Resp:  [14-35] 25 (11/25 0700) BP: (71-140)/(31-82) 128/52 (11/25 0700) SpO2:  [100 %] 100 % (11/25 0700) Arterial Line BP: (84-155)/(49-75) 155/67 (11/25 0700) FiO2 (%):  [30 %] 30 % (11/25 0600) Weight:  [63.8 kg (140 lb 10.5 oz)] 63.8 kg (140 lb 10.5 oz) (11/25 0500)  CBC:   Recent Labs Lab 09/02/16 1400  09/07/16 0444 09/08/16 0500  WBC 14.8*  < > 12.0* 10.2  NEUTROABS 13.1*  --   --   --   HGB 15.3*  < > 12.8 9.3*  HCT 44.1  < > 39.6 27.8*  MCV 94.6  < > 98.3 95.5  PLT 359  < > 175 173  < > = values in this interval not displayed.  Basic Metabolic Panel:   Recent Labs Lab 08/20/2016 1000  09/07/16 0444 09/08/16 0500  NA 156*  < > 149* 143  K 3.0*  < > 3.7 2.7*  CL 122*  --  111 113*  CO2 26  --  27 23  GLUCOSE 137*  --  123* 162*  BUN 21*  --  14 16  CREATININE 0.46  --  0.57 0.46  CALCIUM 8.3*  --  8.4* 7.6*  MG 1.9  --   --  1.8  PHOS 2.3*  --   --  2.0*  < > = values in this interval not displayed.  Lipid Panel:     Component Value Date/Time   CHOL 105 09/02/2016 0115   TRIG 117 09/05/2016 0426   HDL 64 09/02/2016 0115   CHOLHDL 1.6 09/02/2016 0115   VLDL 10 09/02/2016 0115   LDLCALC 31 09/02/2016 0115   HgbA1c:  Lab Results  Component  Value Date   HGBA1C 5.7 (H) 09/02/2016   Urine Drug Screen:     Component Value Date/Time   LABOPIA POSITIVE (A) 08/21/2016 0958   COCAINSCRNUR NONE DETECTED 08/22/2016 0958   COCAINSCRNUR NONE DETECTED 05/11/2015 2037   LABBENZ NONE DETECTED 09/06/2016 0958   AMPHETMU NONE DETECTED 08/26/2016 0958   THCU NONE DETECTED 08/18/2016 0958   LABBARB NONE DETECTED 09/12/2016 0958      IMAGING I have personally reviewed the radiological images below and agree with the radiology interpretations.  Ct Head Wo Contrast 09/07/2016 1. Status post left parietal craniectomy and resection of left frontoparietal hematoma. Small volume of acute hemorrhage within the resection cavity, subarachnoid hemorrhage of surrounding sulci, and trace subdural along posterior falx and left tentorium. 2. Drainage catheter traversing the resection cavity, entering left lateral ventricle, with tip in periventricular white matter of left frontal lobe. 3. Small new acute hemorrhage within the left thalamus. 4. Mild decrease in size of lateral ventricles. Persistent hypoattenuation of ventricular margins likely representing edema. 5. Small volume of pneumocephalus over right greater than left frontal lobes  with mass effect effacing sulci and minimal right-to-left midline shift.   Ct Head Wo Contrast 08/18/2016 1. Unchanged small amounts of superior parasagittal left convexity and right occipital subarachnoid blood.  2. No new areas of hemorrhage.  3. Unchanged appearance of left parietal packing material. No midline shift, new mass effect or hydrocephalus.  4. Expected evolution of ischemia within the left hemisphere lower lobe predominantly involving the white matter and extending to the left frontal cortex.   Ct Head Wo Contrast 08/31/2016 1. Likely subarachnoid hemorrhage at the left convexity, and also at the right occipital lobe. Packing material at the left convexity, reflecting recent evacuation of a  hematoma.  2. Evolving subacute infarct at the left cerebral hemisphere, primarily involving the white matter.  3. Chronic encephalomalacia at the white matter of the right frontal lobe, reflecting remote infarct. Scattered small vessel ischemic microangiopathy.  4. Mild partial opacification of the right mastoid air cells.    Mr Sydney Coleman Contrast 09/02/2016   1. Subacute parenchymal hematoma in the left cerebral hemisphere, the largest is along the left frontal parietal junction has edema, local mass effect, and measures up to 7 cm.  2. Subacute hemorrhage or less likely debris in infratentorial cisterns and lateral ventricles. Mild lateral ventricular hydrocephalus when compared to 2016 comparison.  3. Periventricular T2 hyperintensity is likely a combination of CSF re- absorption and the chronic microvascular disease that was seen in 2016.  4. Superficial siderosis around the cerebral convexities, progressed from 2016. Is there known bleeding diastasis or vascular lesion?   ADDENDUM: Case discussed with Dr. Leonie Coleman today, patient has positive blood culture and purulence on LP today. Additionally, the patient's surgery was noted to be ~4 weeks ago. 1. The larger of 2 left cerebral hematomas is likely superinfected, especially given history of surgery 1 month ago - which makes the vasogenic edema unexpected. 2cm area of presumably hemostatic material in this cavity is also more suspicious in this setting, and may be a nidus of infection. Will be glad to correlate with operative report when it become available. 2. Although there is a suprising lack of ependymal and leptomeningeal enhancement for ventriculitis/meningitis, CSF results implies the intraventricular and subarachnoid material is infectious debris. Lateral ventriculomegaly as previously noted.   EEG - This is an abnormal EEG due to moderate to severe diffuse slowing. This is consistent with a global encephalopathic process, nonspecific as to  etiology. The greater focal slowing in the posterior L hemisphere is consistent with known hemorrhage in that area. There is no evidence of seizure or epileptiform activity to suggest propensity for seizure on this recording.   Repeat EEG 09/04/16 -  This EEG is abnormal due to moderate diffuse slowing of the background. Diffuse cerebral dysfunction that is non-specific in etiology and can be seen with hypoxic/ischemic injury, toxic/metabolic encephalopathies, or medication effect.  There were no electrographic seizures seen in this study. Clinical correlation is advised.  Mr Sydney Coleman Contrast  08/18/2016 1. Diffuse leptomeningeal enhancement and incomplete FLAIR suppression of sulci over convexities, cerebellar folia, and along the surface of the brainstem, including regions without susceptibility signal abnormality, probably represents a combination of redistribution of subarachnoid hemorrhage and meningitis. This appears progressed on the FLAIR sequence.  2. Periventricular enhancement, increasing periventricular T2 FLAIR hyperintense signal abnormality, and debris within occipital horns of ventricles without typical evolution of blood signal is suspicious for ventriculitis and ventricular empyema.  3. Stable hemorrhages within the left paramedian anterior frontal and left posterior  frontal parietal lobes. Enhancement and margin of hemorrhages is nonspecific can be seen as a reaction to hemorrhage or infection.  4. Stable mild enlargement of the lateral and third ventricles probably represents a component of hydrocephalus.  5. Several punctate foci of susceptibility hypointensity in the supratentorial brain compatible with microhemorrhage are new from prior MR.  6. No acute infarct of the brain identified. No herniation or significant midline shift.   Microbiology results per pharmacy: 11/17 blood cx 2/2: serratia 11/18 urine cx: multiple 11/19 csf: GNR on GS, neg cx 11/19 VDRL - ip 11/19 HIV,  HSV, Crypto Ag - neg 11/19 blood cx: NGTD 11/19 fungal smear: NGTD 11/18 MRSA - negative 11/17 Urine - Ecoli 11/19 VDRL - ip 11/23 abscess wound - serratia resistant to ancef 11/23 abscess fluid - serratia resistant to ancef  PHYSICAL EXAM  Temp:  [98.6 F (37 C)-101.2 F (38.4 C)] 98.7 F (37.1 C) (11/25 0400) Pulse Rate:  [73-104] 87 (11/25 0700) Resp:  [14-35] 25 (11/25 0700) BP: (71-140)/(31-82) 128/52 (11/25 0700) SpO2:  [100 %] 100 % (11/25 0700) Arterial Line BP: (84-155)/(49-75) 155/67 (11/25 0700) FiO2 (%):  [30 %] 30 % (11/25 0600) Weight:  [63.8 kg (140 lb 10.5 oz)] 63.8 kg (140 lb 10.5 oz) (11/25 0500)  No changes in exam today, stable  General - Well nourished, well developed, intubated and on low dose propofol.  Ophthalmologic - Fundi not visualized due to noncooperation.  Cardiovascular - Regular rate and rhythm.  Neuro - intubated and on low dose propofol, eyes open, tracking to objects intermittently and blinking to visual threat bilaterally, however, not following commands. Eyes in the middle position and doll's eye sluggish, PERRL. Positive corneal and gag. On pain stimulation, RUE 0/5, RLE 2/5 on pain, LLE 3/5 on pain, and LUE intermittent spontaneous movement. DTR 1+ and no babinski. Sensation, coordination and gait not tested. Nuchal rigidity not tested due to intubation.    ASSESSMENT/PLAN Ms. Sydney Coleman is a 66 y.o. female with history of previous TIA, previous stroke, previous subarachnoid hemorrhage, hypertension, hypothyroidism, hyperlipidemia, COPD, chronic headaches, and anxiety presenting with nausea, vomiting, left gaze preference, altered mental status, and no longer speaking.  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  Surgical I&D - 09/13/2016 - POD #2 - NSG following  Ct Head good evacuation - EVD position corrected  ID on  board  Continue EVD drainage  Abscess cultures - SERRATIA MARCESCENS - Cefazolin resistant  ID and pharmacy on board, appreciate recs. Meropenem Day #1 / Tressie Ellis Day #1. Flagyl for anaerobic coverage.  Bacterial meningitis / encephalitis (no acute SAH this time)- hematogenous source from bacteremia vs. local process due to recent surgical procedure.   Resultant - fever, chill, leukocytosis, AMS  LP consistent with bacterial meningitis  BCx positive for serratia marcescens - pan sensitive except cefazolin.    CSF culture NGTD  CT head - concerning for small SAH x2 - left medial frontal and right inferior parietal, but most likely the proteinaceous secretions  MRI  - concerning for pus at above sites  Repeat MRI brain 09/05/16 - progressive ventriculitis and meningitis with increasing brain abscesses  WBC trending down 22.7-19.6-17.6-14.8-10.2-11.1-9.5 - 12.0 -> 10.2  febrile Tmax 102.8 - 100  ID on board  On Fortaz now - Abx for 6 weeks duration  No need TEE to rule out endocarditis as G- rods not generally causing endocarditis.   ?  Seizure  2-3 episodes of left UE shaking/tremor 09/03/16. Today with rhythmic left should movements with repositioning likely myoclonus will monitor  EEG x 2 - diffuse slowing  Continue keppra to 1000mg  bid  Hypernatremia  Na 145-149-153-154 -156 - 154 - 149  Likely due to dehydration with fever, ventilation and limited fluid intack  continue IVF   Close monitoring  Left pareital hemorrhage s/p craniotomy 4 weeks ago   Resultant  aphasia and dense right hemiplegia  CT head - encephalomacia at left hemisphere  UDS - positive for opiates  LDL - 31  HgbA1c -  5.7  VTE prophylaxis - subq heparin Diet NPO time specified  aspirin 81 mg daily prior to admission, now on ASA 81mg  daily  Ongoing aggressive stroke risk factor management  Therapy recommendations: pending  Disposition: Pending  H/o similar aneurysm negative SAH  in July 2016  Angio negative  Followed with Dr. Krista Blue in clinic.  Unclear etiology at that time  Hypertension  Stable  BP goal < 160  Hyperlipidemia  Home meds:  Pravachol 20 mg daily prior to admission  LDL 31  Resume statin on discharge  Tobacco abuse  Current smoker  Smoking cessation counseling will be provided  Other Stroke Risk Factors  Advanced age  Hx stroke/TIA  Family hx stroke (mother)  Other Active Problems  Hypokalemia - 3.4 -> 3.0 -> 4.0-> 3.3 -> 3.7 -> 2.7 - supplemented - recheck in a.m.  Leukocytosis - 22.7 -> 19.6 -> 17.6 ->14.8 -> 13.0->10.2->11.2 -> 9.5 -> 12.0 -> 10.2  Hx of elevated serum homocysteine in 2016 - repeat homocysteine 8.3  Anemia - 9.3 / 27.8   Hospital day # 7  This patient is critically ill due to bacterial meningitis, recent left ICH s/p craniectomy, UTI and bacteremia, seizure and at significant risk of neurological worsening, death form sepsis, vasospasm, status epilepticus, recurrent brain bleed. This patient's care requires constant monitoring of vital signs, hemodynamics, respiratory and cardiac monitoring, review of multiple databases, neurological assessment, discussion with family, other specialists and medical decision making of high complexity. I spent 45 minutes of neurocritical care time in the care of this patient.   Personally examined patient and images, and have participated in and made any corrections needed to history, physical, neuro exam,assessment and plan as stated above.  I have personally obtained the history, evaluated lab date, reviewed imaging studies and agree with radiology interpretations.    Sarina Ill, MD Stroke Neurology      To contact Stroke Continuity provider, please refer to [email or web address] After hours, contact General Neurology

## 2016-09-08 NOTE — Progress Notes (Signed)
PULMONARY / CRITICAL CARE MEDICINE   Name: Sydney Coleman MRN: [id number] DOB: 03-08-1951    ADMISSION DATE:  08/25/2016 CONSULTATION DATE:  08/29/2016  REFERRING MD:  Neuro /Dr. Cheral Marker   CHIEF COMPLAINT:  SAH   SUMMARY:   65 y/o F admitted to Neuro ICU on 08/22/2016 with new subarachnoid hemorrhages. 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 She was brought in to ER for gradually worsening mental status that started morning of 08/31/16 . She was at [facility] for rehabilitation and her family reported that her baseline mental status was alert and oriented times three, conversant and able to eat without difficulty. They reported that the patient started complaining of nausea and vomiting. Reportedly she was starting to act unusual and be less responsive at 8 AM on 11/17/17including some left gaze preference but not consistently so. Later in day on 08/31/16 she became nonverbal and acutely ill. She was transported by EMS for further evaluation.   She was taken to [facility] ER where CT head revealed two new subarachnoid hemorrhages, one involving sulci of the medial left frontal lobe and one involving sulci along the right occipital lobe. Also noted on OSH CT was recent craniotomy defect with encephalomalacia in the adjacent left parietofrontal region, consistent with evacuation of recent prior lobar hemorrhage. Chronic small vessel ischemic changes were also noted, evolving subacute infarct of left cerebral hemisphere . She was transferred to [facility] hospital to Neuro ICU.  PCCM consulted.   SUBJECTIVE:   Stable on vent.  Did not wean as well today.    VITAL SIGNS: BP (!) 127/52   Pulse 94   Temp (!) 100.8 F (38.2 C) (Axillary)   Resp (!) 22   Ht 5\' 3"  (1.6 m)   Wt 63.8 kg (140 lb 10.5 oz)   SpO2 100%   BMI 24.92 kg/m   HEMODYNAMICS:    VENTILATOR SETTINGS: Vent Mode: PRVC FiO2 (%):  [30 %] 30 % Set  Rate:  [14 bmp] 14 bmp Vt Set:  [440 mL] 440 mL PEEP:  [5 cmH20] 5 cmH20 Pressure Support:  [8 cmH20-10 cmH20] 8 cmH20 Plateau Pressure:  [13 cmH20-16 cmH20] 16 cmH20  INTAKE / OUTPUT: I/O last 3 completed shifts: In: 6736.4 [I.V.:2791.4; NG/GT:1615; IV Piggyback:2330] Out: [id number] [Urine:2810; Drains:43; Stool:475]  PHYSICAL EXAMINATION: General:  Critically ill appearing female in NAD on vent Neuro:  Eyes closed, moves BLE with stimulation, does not follow commands, R sided weakness.  HEENT:  Oral mucosa dry, ETT  Cardiovascular:  RRR  Lungs:  resps even non labored on vent, Decreased BS in bases w/ no wheezing  Abdomen:  Soft, NT,  Hypoactive BS  Musculoskeletal:  No acute deformities Skin: scalp incision w/ crusting noted.   LABS:  BMET  Recent Labs Lab 08/16/2016 1000 08/21/2016 1259 09/07/16 0444 09/08/16 0500  NA 156* 154* 149* 143  K 3.0* 3.3* 3.7 2.7*  CL 122*  --  111 113*  CO2 26  --  27 23  BUN 21*  --  14 16  CREATININE 0.46  --  0.57 0.46  GLUCOSE 137*  --  123* 162*    Electrolytes  Recent Labs Lab 09/04/16 1635  08/23/2016 1000 09/07/16 0444 09/08/16 0500  CALCIUM  --   < > 8.3* 8.4* 7.6*  MG 2.0  --  1.9  --  1.8  PHOS 2.5  --  2.3*  --  2.0*  < > =  values in this interval not displayed.  CBC  Recent Labs Lab 08/15/2016 1000 09/02/2016 1259 09/07/16 0444 09/08/16 0500  WBC 9.5  --  12.0* 10.2  HGB 12.1 9.9* 12.8 9.3*  HCT 38.0 29.0* 39.6 27.8*  PLT 202  --  175 173    Coag's  Recent Labs Lab 08/18/2016 1223  INR 1.02    Sepsis Markers  Recent Labs Lab 09/07/16 1417  LATICACIDVEN 1.1    ABG  Recent Labs Lab 09/02/16 1530 09/04/16 0416 08/26/2016 1259  PHART 7.525* 7.496* 7.398  PCO2ART 28.7* 35.1 53.9*  PO2ART 314* 114* 419.0*    Liver Enzymes  Recent Labs Lab 09/02/16 1400  AST 25  ALT 22  ALKPHOS 185*  BILITOT 0.6  ALBUMIN 2.4*    Cardiac Enzymes No results for input(s): TROPONINI, PROBNP in the last 168  hours.  Glucose  Recent Labs Lab 09/07/16 1534 09/07/16 1938 09/07/16 2313 09/08/16 0338 09/08/16 0755 09/08/16 1143  GLUCAP 152* 143* 114* 144* 115* 146*    Imaging Dg Chest Port 1 View  Result Date: 09/08/2016 CLINICAL DATA:  Respiratory failure EXAM: PORTABLE CHEST 1 VIEW COMPARISON:  08/30/2016 FINDINGS: Endotracheal tube tip 3.7 cm above the carina. Right PICC line tip: SVC. Feeding tube extends down into the stomach. Atherosclerotic calcification of the aortic arch. Heart size within normal limits. The lungs appear clear. Capsular calcification of breast implants. IMPRESSION: 1. Tubes and lines are in satisfactorily position. There is a new right-sided PICC line with tip projecting over the SVC. 2. Atherosclerotic calcification of the aortic arch. Electronically Signed   By: Van Clines M.D.   On: 09/08/2016 08:58     STUDIES:  CT head 11/17 >> Likely subarachnoid hemorrhage at the left convexity, and also at the right occipital lobe. Packing material at the left convexity,reflecting recent evacuation of a hematoma. Evolving subacute infarct at the left cerebral hemisphere, primarily involving the white matter. CT head 11/18 >> no changes  MRI 11/18 >> Subacute parenchymal hematoma in the left cerebral hemisphere, the largest is along the left frontal parietal junction has edema, local mass effect, and measures up to 7 cm.2. Subacute hemorrhage or less likely debris in infratentorial cisterns and lateral ventricles. Mild lateral ventricularhydrocephalus when compared to 2016 comparison. Periventricular T2 hyperintensity is likely a combination of CSFre- absorption and the chronic microvascular disease that was seenin 2016.4. Superficial siderosis around the cerebral convexities, progressed from 2016  EEG 11/18 >> no seizure, global encephalopathic, mod to severe diffuse slowing.  EEG 11/21 >> no seizure, diffuse cerebral dysfunction MRI 11/23>>> 1. Diffuse leptomeningeal  enhancement and incomplete FLAIR suppression of sulci over convexities, cerebellar folia, and along the surface of the brainstem, including regions without susceptibility signal abnormality, probably represents a combination of redistribution of subarachnoid hemorrhage and meningitis. This appears progressed on the FLAIR sequence. 2. Periventricular enhancement, increasing periventricular T2 FLAIR hyperintense signal abnormality, and debris within occipital horns of ventricles without typical evolution of blood signal is suspicious for ventriculitis and ventricular empyema. 3. Stable hemorrhages within the left paramedian anterior frontal and left posterior frontal parietal lobes. Enhancement and margin of hemorrhages is nonspecific can be seen as a reaction to hemorrhage or infection. 4. Stable mild enlargement of the lateral and third ventricles probably represents a component of hydrocephalus. 5. Several punctate foci of susceptibility hypointensity in the supratentorial brain compatible with microhemorrhage are new from prior MR. 6. No acute infarct of the brain identified. No herniation or significant midline shift.  CULTURES:  [facility] 08/31/16 >> serratia UC 11/17 >> E coli CSF culture 11/19 >> GNR >>neg BCx2 11/19 >>  Intercerebral abscess 11/23>>> serratia (RES cefazolin otherwise sens)  ANTIBIOTICS: Merrem 11/18 >>off Flagyl 11/19 >> 11/19, 11/25>>> linazolid 11/17 >>off ceftaz 11/25>>>  SIGNIFICANT EVENTS: 11/17 Transfer from Rehab to [facility] ER, then to [facility] Neuro ICU for 2 new SAH   LINES/TUBES: ETT 11/19 >>  DISCUSSION: 65 yo with recent [facility] s/p craniotomy and clot evacuation admitted with recurrent SAH .BC panel with + serration. [employer] abx added . LP is consistent with CNS infection.   ASSESSMENT / PLAN:  NEUROLOGIC A:   Bacterial Meningitis / Encephalitis - ? Related to bacteremia and local brain flap infection s/p recent surgical procedure Hx Left  Parietal Hemorrhage EEG no seizure  Concern for worsening abscess/infection of surgical site - s/p washout and removal bone flap 11/23 P:   Monitor mental status closely  Neuro/NS following  Avoid sedating rx  AED's per neuro    PULMONARY A: Tachypnea ?secondary neuro / infectious etiology  Acute respiratory failure in setting AMS  P:   PRVC 8 cc/kg  Wean PEEP / FiO2 for sats > 92% PSV wean but mental status limits extubation  Intermittent CXR  D7 vent - Need to discuss early trach - per RN family says pt would never want trach but now they may want to pursue   CARDIOVASCULAR A:  HTN - improved.  P:  Labetalol IV PRN Hold norvasc /cozaar for now  Tele monitoring   RENAL A:   Hypokalemia  Hypophosphatemia P:   Trend BMP / UOP  K phos 11/25    GASTROINTESTINAL A:   At Risk Malnutrition  P:   TF per nutrition PPI   HEMATOLOGIC A:   No active issues  P:  Trend CBC SCD's for DVT prophylaxis   INFECTIOUS A:   Bacteremia -Serratia on BC panel -  Brain abscess source  GNR in CSF P:   Trend Temp /WBC  Follow cultures Appreciate help from ID ID managing abx  See neuro   ENDOCRINE  Hypothyroid -tsh ok  Hyperglycemia  P:   Continue Synthroid  SSI     FAMILY  - Updates: no family available 11/25 - Inter-disciplinary family meet or Palliative Care meeting due by:  09/08/16    Nickolas Madrid, NP 09/08/2016  3:06 PM Pager: [phone] or [phone]  ATTENDING NOTE / ATTESTATION NOTE :   I have discussed the case with the resident/APP Nickolas Madrid NP.   I agree with the resident/APP's  history, physical examination, assessment, and plans.    I have edited the above note and modified it according to our agreed history, physical examination, assessment and plan. Note as above. Pt with bacterial meningitis, had evacuation of abscess this week. On vent for unable to protect airway. VSS. Crackles at bases. ETT in place. Comfortable. Cont  abx. Cont meds. Cont IVF. May need trache.   I have spent 30  minutes of critical care time with this patient today.  Family :Family updated at length today.  No family at bedside.    Monica Becton, MD 09/08/2016, 4:42 PM [facility] Pager [phone] After 3 pm or if no answer, call [phone]

## 2016-09-08 NOTE — Progress Notes (Signed)
[facility] Progress Note Patient Name: Sydney Coleman DOB: 1951-02-19 MRN: [id number]   Date of Service  09/08/2016  HPI/Events of Note  Hypokalemia, hypophos, relatively low mag  eICU Interventions  Potassium, phos, and mag replaced     Intervention Category Intermediate Interventions: Electrolyte abnormality - evaluation and management  Cruzita Lipa 09/08/2016, 6:19 AM

## 2016-09-08 NOTE — Progress Notes (Signed)
IVC catheter site draining on pillow case. IVC drain still has pulsatile flow, but no drainage in chamber. MD notified. Dr. Saintclair Halsted instructed to reinforce dressing and lower drain to 5 cm H2O.

## 2016-09-08 NOTE — Progress Notes (Signed)
Patient ID: Sydney Coleman, female   DOB: May 12, 1951, 65 y.o.   MRN: [id number] Subjective:  The patient is somnolent but arousable. She is in no apparent distress. She is intubated.  Objective: Vital signs in last 24 hours: Temp:  [98.6 F (37 C)-101.2 F (38.4 C)] 98.7 F (37.1 C) (11/25 0400) Pulse Rate:  [73-104] 94 (11/25 0800) Resp:  [14-35] 24 (11/25 0800) BP: (71-140)/(31-83) 133/83 (11/25 0800) SpO2:  [100 %] 100 % (11/25 0800) Arterial Line BP: (84-157)/(49-73) 157/73 (11/25 0800) FiO2 (%):  [30 %] 30 % (11/25 0800) Weight:  [63.8 kg (140 lb 10.5 oz)] 63.8 kg (140 lb 10.5 oz) (11/25 0500)  Intake/Output from previous day: 11/24 0701 - 11/25 0700 In: 5651 [I.V.:1896; QI:[id number]; IV Piggyback:2170] Out: 2462 [Urine:2030; Drains:32; Stool:400] Intake/Output this shift: Total I/O In: 2317.5 [I.V.:2267.5; NG/GT:50] Out: 327 [Urine:325; Drains:2]  Physical exam Glasgow Coma Scale 9 intubated, [id number]. The patient briskly localizes on the left. She is densely right hemiparetic. Her pupils are equal.  The patient's ventriculostomy is draining.  Lab Results:  Recent Labs  09/07/16 0444 09/08/16 0500  WBC 12.0* 10.2  HGB 12.8 9.3*  HCT 39.6 27.8*  PLT 175 173   BMET  Recent Labs  09/07/16 0444 09/08/16 0500  NA 149* 143  K 3.7 2.7*  CL 111 113*  CO2 27 23  GLUCOSE 123* 162*  BUN 14 16  CREATININE 0.57 0.46  CALCIUM 8.4* 7.6*    Studies/Results: Ct Head Wo Contrast  Result Date: 09/07/2016 CLINICAL DATA:  65 y/o  F; cerebral abscess post re-exploration. EXAM: CT HEAD WITHOUT CONTRAST TECHNIQUE: Contiguous axial images were obtained from the base of the skull through the vertex without intravenous contrast. COMPARISON:  09/05/2016 MRI of the brain and 08/19/2016 CT of the head. FINDINGS: Brain: There is a resection cavity within the left paramedian frontal parietal lobe containing air, fluid, and high attenuation acute blood products. Traversing the resection  cavity, entering the left lateral ventricle, with tip in periventricular white matter of left frontal lobe is a drainage catheter. Small acute hemorrhage within the left thalamus. There is a small amount of air within the frontal horn of left lateral ventricle. Stable isodense hematoma within the anterior left paramedian frontal lobe. Small volume of pneumocephalus over the right greater than left frontal lobes. Pneumocephalus exerts mass effect on the brain with effacement of sulci and minimal right to left midline shift. Small subdural hematoma along the falx and left tentorium cerebelli and small volume of acute subarachnoid hemorrhage overlying sulci in the region of the resection cavity. No evidence for new large acute territory infarct. Mild decrease in the lateral ventricle size. Persistent hypoattenuation in margins of the ventricles probably representing edema. Vascular: No hyperdense vessel or unexpected calcification. Skull: Left parietal craniectomy with a small amount of fluid in the resection cavity and edema in the overlying scalp. Sinuses/Orbits: No acute finding. Other: Endotracheal and enteric tubes. IMPRESSION: 1. Status post left parietal craniectomy and resection of left frontoparietal hematoma. Small volume of acute hemorrhage within the resection cavity, subarachnoid hemorrhage of surrounding sulci, and trace subdural along posterior falx and left tentorium. 2. Drainage catheter traversing the resection cavity, entering left lateral ventricle, with tip in periventricular white matter of left frontal lobe. 3. Small new acute hemorrhage within the left thalamus. 4. Mild decrease in size of lateral ventricles. Persistent hypoattenuation of ventricular margins likely representing edema. 5. Small volume of pneumocephalus over right greater than left frontal lobes with mass  effect effacing sulci and minimal right-to-left midline shift. Electronically Signed   By: Kristine Garbe M.D.   On:  09/07/2016 03:21   Dg Chest Port 1 View  Result Date: 09/08/2016 CLINICAL DATA:  Respiratory failure EXAM: PORTABLE CHEST 1 VIEW COMPARISON:  09/01/2016 FINDINGS: Endotracheal tube tip 3.7 cm above the carina. Right PICC line tip: SVC. Feeding tube extends down into the stomach. Atherosclerotic calcification of the aortic arch. Heart size within normal limits. The lungs appear clear. Capsular calcification of breast implants. IMPRESSION: 1. Tubes and lines are in satisfactorily position. There is a new right-sided PICC line with tip projecting over the SVC. 2. Atherosclerotic calcification of the aortic arch. Electronically Signed   By: Van Clines M.D.   On: 09/08/2016 08:58    Assessment/Plan: Postop day #2: The patient is neurologically stable. We will continue the ventriculostomy and supportive care.  LOS: 7 days     Taraya Steward D 09/08/2016, 9:06 AM

## 2016-09-08 NOTE — Progress Notes (Addendum)
Pt IVC minimally draining despite manipulation of it's height, no longer pulsatile, notable debris in the tubing.  Neuro status unchanged, Dr. Arnoldo Morale made aware.

## 2016-09-08 NOTE — Progress Notes (Signed)
Pharmacy Antibiotic Note  Sydney Coleman is a 65 y.o. female admitted with brain abscess with intraventricular infection and serratia bacteremia, currently being treated with meropenem. Abscess cultures have come back as serratia with resistance to cefazolin. Will restart ceftazidime and start flagyl for anaerobic coverage.  Plan: Ceftazidime 2g IV q8h Flagyl 500mg  IV q6h Monitor culture data, renal function and clinical course Narrow as able  Height: 5\' 3"  (160 cm) Weight: 140 lb 10.5 oz (63.8 kg) IBW/kg (Calculated) : 52.4  Temp (24hrs), Avg:100 F (37.8 C), Min:98.6 F (37 C), Max:101.1 F (38.4 C)   Recent Labs Lab 09/04/16 0506 09/05/16 0426 08/28/2016 1000 09/07/16 0444 09/07/16 1417 09/08/16 0500  WBC 10.2 11.1* 9.5 12.0*  --  10.2  CREATININE 0.62 0.65 0.46 0.57  --  0.46  LATICACIDVEN  --   --   --   --  1.1  --     Estimated Creatinine Clearance: 62.2 mL/min (by C-G formula based on SCr of 0.46 mg/dL).    Allergies  Allergen Reactions  . Penicillins Shortness Of Breath    Has patient had a PCN reaction causing immediate rash, facial/tongue/throat swelling, SOB or lightheadedness with hypotension: Yes Has patient had a PCN reaction causing severe rash involving mucus membranes or skin necrosis: No Has patient had a PCN reaction that required hospitalization Yes Has patient had a PCN reaction occurring within the last 10 years: Yes If all of the above answers are "NO", then may proceed with Cephalosporin use.   Marland Kitchen Amoxil [Amoxicillin] Rash    Has patient had a PCN reaction causing immediate rash, facial/tongue/throat swelling, SOB or lightheadedness with hypotension: Yes Has patient had a PCN reaction causing severe rash involving mucus membranes or skin necrosis: Yes Has patient had a PCN reaction that required hospitalization No Has patient had a PCN reaction occurring within the last 10 years: Yes If all of the above answers are "NO", then may proceed with  Cephalosporin use.   . Augmentin [Amoxicillin-Pot Clavulanate] Rash    Has patient had a PCN reaction causing immediate rash, facial/tongue/throat swelling, SOB or lightheadedness with hypotension: Yes Has patient had a PCN reaction causing severe rash involving mucus membranes or skin necrosis: Yes Has patient had a PCN reaction that required hospitalization No Has patient had a PCN reaction occurring within the last 10 years: Yes If all of the above answers are "NO", then may proceed with Cephalosporin use.   . Other Rash    Elastic  . Vancomycin Rash    Antimicrobials this admission: 11/17 levaquin x1 11/18 cipro x1 11/18 meropenem >>11/22, resume 11/24 >>11/25 11/19 zyvox >>11/22 11/19 flagyl x1 11/22 Ceftaz >>11/24, 11/25>> 11/25 Flagyl >>  Dose adjustments this admission: n/a  Microbiology results: 11/17 blood cx 2/2: serratia 11/18 urine cx: multiple 11/19 csf: GNR on GS, neg cx 11/19 VDRL - ip 11/19 HIV, HSV, Crypto Ag - neg 11/19 blood cx: NGTD 11/19 fungal smear: NGTD 11/18 MRSA - negative 11/17 Urine - Ecoli 11/19 VDRL - ip 11/23 abscess wound - serratia resistant to ancef 11/23 abscess fluid - serratia resistant to ancef  Thank you for allowing pharmacy to be a part of this patient's care.  Rebecka Apley 09/08/2016 1:34 PM

## 2016-09-09 DIAGNOSIS — J9600 Acute respiratory failure, unspecified whether with hypoxia or hypercapnia: Secondary | ICD-10-CM

## 2016-09-09 LAB — HCV COMMENT:

## 2016-09-09 LAB — GLUCOSE, CAPILLARY
GLUCOSE-CAPILLARY: 120 mg/dL — AB (ref 65–99)
GLUCOSE-CAPILLARY: 141 mg/dL — AB (ref 65–99)
GLUCOSE-CAPILLARY: 152 mg/dL — AB (ref 65–99)
Glucose-Capillary: 128 mg/dL — ABNORMAL HIGH (ref 65–99)
Glucose-Capillary: 124 mg/dL — ABNORMAL HIGH (ref 65–99)
Glucose-Capillary: 134 mg/dL — ABNORMAL HIGH (ref 65–99)

## 2016-09-09 LAB — BASIC METABOLIC PANEL
Anion gap: 7 (ref 5–15)
BUN: 10 mg/dL (ref 6–20)
CHLORIDE: 110 mmol/L (ref 101–111)
CO2: 23 mmol/L (ref 22–32)
CREATININE: 0.38 mg/dL — AB (ref 0.44–1.00)
Calcium: 7.5 mg/dL — ABNORMAL LOW (ref 8.9–10.3)
GFR calc Af Amer: >60 mL/min (ref >60)
GFR calc non Af Amer: >60 mL/min (ref >60)
Glucose, Bld: 146 mg/dL — ABNORMAL HIGH (ref 65–99)
Potassium: 3.6 mmol/L (ref 3.5–5.1)
Sodium: 140 mmol/L (ref 135–145)

## 2016-09-09 LAB — CBC
HCT: 28.3 % — ABNORMAL LOW (ref 36.0–46.0)
Hemoglobin: 9.5 g/dL — ABNORMAL LOW (ref 12.0–15.0)
MCH: 31.8 pg (ref 26.0–34.0)
MCHC: 33.6 g/dL (ref 30.0–36.0)
MCV: 94.6 fL (ref 78.0–100.0)
PLATELETS: 167 10*3/uL (ref 150–400)
RBC: 2.99 MIL/uL — AB (ref 3.87–5.11)
RDW: 13.2 % (ref 11.5–15.5)
WBC: 10.6 10*3/uL — ABNORMAL HIGH (ref 4.0–10.5)

## 2016-09-09 LAB — AEROBIC CULTURE W GRAM STAIN (SUPERFICIAL SPECIMEN)

## 2016-09-09 LAB — HEPATITIS B SURFACE ANTIGEN: HEP B S AG: NEGATIVE

## 2016-09-09 LAB — AEROBIC CULTURE  (SUPERFICIAL SPECIMEN)

## 2016-09-09 LAB — HEPATITIS C ANTIBODY (REFLEX)

## 2016-09-09 NOTE — Progress Notes (Signed)
PULMONARY / CRITICAL CARE MEDICINE   Name: Sydney Coleman MRN: [id number] DOB: June 19, 1951    ADMISSION DATE:  08/31/2016 CONSULTATION DATE:  08/17/2016  REFERRING MD:  Neuro /Dr. Cheral Marker   CHIEF COMPLAINT:  SAH   SUMMARY:   65 y/o F admitted to Neuro ICU on 08/22/2016 with new subarachnoid hemorrhages. 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 She was brought in to ER for gradually worsening mental status that started morning of 08/31/16 . She was at [facility] for rehabilitation and her family reported that her baseline mental status was alert and oriented times three, conversant and able to eat without difficulty. They reported that the patient started complaining of nausea and vomiting. Reportedly she was starting to act unusual and be less responsive at 8 AM on 11/17/17including some left gaze preference but not consistently so. Later in day on 08/31/16 she became nonverbal and acutely ill. She was transported by EMS for further evaluation.   She was taken to [facility] ER where CT head revealed two new subarachnoid hemorrhages, one involving sulci of the medial left frontal lobe and one involving sulci along the right occipital lobe. Also noted on OSH CT was recent craniotomy defect with encephalomalacia in the adjacent left parietofrontal region, consistent with evacuation of recent prior lobar hemorrhage. Chronic small vessel ischemic changes were also noted, evolving subacute infarct of left cerebral hemisphere . She was transferred to [facility] hospital to Neuro ICU.  PCCM consulted.   SUBJECTIVE:   Stable on vent.   Off sedation since 11/24. Did not do well on wean. Not a lot of effort  VITAL SIGNS: BP 136/79   Pulse 99   Temp (!) 100.7 F (38.2 C) (Axillary)   Resp (!) 24   Ht 5\' 3"  (1.6 m)   Wt 64.5 kg (142 lb 3.2 oz)   SpO2 99%   BMI 25.19 kg/m   HEMODYNAMICS:    VENTILATOR SETTINGS: Vent Mode:  PSV;CPAP FiO2 (%):  [30 %] 30 % Set Rate:  [14 bmp] 14 bmp Vt Set:  [440 mL] 440 mL PEEP:  [5 cmH20] 5 cmH20 Pressure Support:  [5 cmH20] 5 cmH20 Plateau Pressure:  [12 cmH20-14 cmH20] 14 cmH20  INTAKE / OUTPUT: I/O last 3 completed shifts: In: 7812.5 [I.V.:4892.5; Other:250; NG/GT:2050; IV Piggyback:620] Out: [id number] [Urine:4450; Drains:59; Stool:400]  PHYSICAL EXAMINATION: General:  Critically ill appearing female in NAD on vent Neuro:  Eyes closed, moves BLE with stimulation, does not follow commands, R sided weakness.  HEENT:  Oral mucosa dry, ETT  Cardiovascular:  RRR  Lungs:  resps even non labored on vent, Decreased BS in bases w/ no wheezing  Abdomen:  Soft, NT,  Hypoactive BS  Musculoskeletal:  No acute deformities Skin: scalp incision w/ crusting noted.   LABS:  BMET  Recent Labs Lab 09/07/16 0444 09/08/16 0500 09/09/16 0420  NA 149* 143 140  K 3.7 2.7* 3.6  CL 111 113* 110  CO2 27 23 23   BUN 14 16 10   CREATININE 0.57 0.46 0.38*  GLUCOSE 123* 162* 146*    Electrolytes  Recent Labs Lab 09/04/16 1635  09/13/2016 1000 09/07/16 0444 09/08/16 0500 09/09/16 0420  CALCIUM  --   < > 8.3* 8.4* 7.6* 7.5*  MG 2.0  --  1.9  --  1.8  --   PHOS 2.5  --  2.3*  --  2.0*  --   < > = values in this  interval not displayed.  CBC  Recent Labs Lab 09/07/16 0444 09/08/16 0500 09/09/16 0420  WBC 12.0* 10.2 10.6*  HGB 12.8 9.3* 9.5*  HCT 39.6 27.8* 28.3*  PLT 175 173 167    Coag's  Recent Labs Lab 09/04/2016 1223  INR 1.02    Sepsis Markers  Recent Labs Lab 09/07/16 1417  LATICACIDVEN 1.1    ABG  Recent Labs Lab 09/02/16 1530 09/04/16 0416 08/23/2016 1259  PHART 7.525* 7.496* 7.398  PCO2ART 28.7* 35.1 53.9*  PO2ART 314* 114* 419.0*    Liver Enzymes No results for input(s): AST, ALT, ALKPHOS, BILITOT, ALBUMIN in the last 168 hours.  Cardiac Enzymes No results for input(s): TROPONINI, PROBNP in the last 168 hours.  Glucose  Recent  Labs Lab 09/08/16 1552 09/08/16 1915 09/08/16 2326 09/09/16 0341 09/09/16 0706 09/09/16 1135  GLUCAP 111* 131* 150* 128* 124* 120*    Imaging No results found.   STUDIES:  CT head 11/17 >> Likely subarachnoid hemorrhage at the left convexity, and also at the right occipital lobe. Packing material at the left convexity,reflecting recent evacuation of a hematoma. Evolving subacute infarct at the left cerebral hemisphere, primarily involving the white matter. CT head 11/18 >> no changes  MRI 11/18 >> Subacute parenchymal hematoma in the left cerebral hemisphere, the largest is along the left frontal parietal junction has edema, local mass effect, and measures up to 7 cm.2. Subacute hemorrhage or less likely debris in infratentorial cisterns and lateral ventricles. Mild lateral ventricularhydrocephalus when compared to 2016 comparison. Periventricular T2 hyperintensity is likely a combination of CSFre- absorption and the chronic microvascular disease that was seenin 2016.4. Superficial siderosis around the cerebral convexities, progressed from 2016  EEG 11/18 >> no seizure, global encephalopathic, mod to severe diffuse slowing.  EEG 11/21 >> no seizure, diffuse cerebral dysfunction MRI 11/23>>> 1. Diffuse leptomeningeal enhancement and incomplete FLAIR suppression of sulci over convexities, cerebellar folia, and along the surface of the brainstem, including regions without susceptibility signal abnormality, probably represents a combination of redistribution of subarachnoid hemorrhage and meningitis. This appears progressed on the FLAIR sequence. 2. Periventricular enhancement, increasing periventricular T2 FLAIR hyperintense signal abnormality, and debris within occipital horns of ventricles without typical evolution of blood signal is suspicious for ventriculitis and ventricular empyema. 3. Stable hemorrhages within the left paramedian anterior frontal and left posterior frontal  parietal lobes. Enhancement and margin of hemorrhages is nonspecific can be seen as a reaction to hemorrhage or infection. 4. Stable mild enlargement of the lateral and third ventricles probably represents a component of hydrocephalus. 5. Several punctate foci of susceptibility hypointensity in the supratentorial brain compatible with microhemorrhage are new from prior MR. 6. No acute infarct of the brain identified. No herniation or significant midline shift.  CULTURES: BC 08/31/16 >> serratia UC 11/17 >> E coli CSF culture 11/19 >> GNR >>neg BCx2 11/19 >>  Intercerebral abscess 11/23>>> serratia (RES cefazolin otherwise sens)  ANTIBIOTICS: Merrem 11/18 >>off Flagyl 11/19 >> 11/19, 11/25>>> linazolid 11/17 >>off ceftaz 11/25>>>  SIGNIFICANT EVENTS: 11/17 Transfer from Rehab to [facility] ER, then to [facility] Neuro ICU for 2 new SAH   LINES/TUBES: ETT 11/19 >>  DISCUSSION: 65 yo with recent [facility] s/p craniotomy and clot evacuation admitted with recurrent SAH .BC panel with + serration. [employer] abx added . LP is consistent with CNS infection.   ASSESSMENT / PLAN:  NEUROLOGIC A:   Bacterial Meningitis / Encephalitis - ? Related to bacteremia and local brain flap infection s/p recent surgical procedure  Hx Left Parietal Hemorrhage EEG no seizure  Concern for worsening abscess/infection of surgical site - s/p washout and removal bone flap 11/23 P:   Monitor mental status closely  Neuro/NS following  Avoid sedating rx > off sedatives since 11/24 AED's per neuro    PULMONARY A: Tachypnea ?secondary neuro / infectious etiology  Acute respiratory failure in setting AMS  P:   PRVC 8 cc/kg  Wean PEEP / FiO2 for sats > 92% PSV wean but mental status limits extubation  Intermittent CXR  D8 vent - Need to discuss early trach - per RN family says pt would never want trach but now they may want to pursue. Need to talk to family  CARDIOVASCULAR A:  HTN - improved.  P:   Labetalol IV PRN Hold norvasc /cozaar for now  Tele monitoring   RENAL A:   Hypokalemia  Hypophosphatemia P:   Trend BMP / UOP  K phos 11/25    GASTROINTESTINAL A:   At Risk Malnutrition  P:   TF per nutrition PPI   HEMATOLOGIC A:   No active issues  P:  Trend CBC SCD's for DVT prophylaxis   INFECTIOUS A:   Bacteremia -Serratia on BC panel -  Brain abscess source  GNR in CSF P:   Trend Temp /WBC  Follow cultures Appreciate help from ID ID managing abx  See neuro   ENDOCRINE  Hypothyroid -tsh ok  Hyperglycemia  P:   Continue Synthroid  SSI     FAMILY  - Updates: no family available 11/26 - Inter-disciplinary family meet or Palliative Care meeting due by:  09/08/16    I have spent 30  minutes of critical care time with this patient today.     Monica Becton, MD 09/09/2016, 2:40 PM [facility] Pager [phone] After 3 pm or if no answer, call [phone]

## 2016-09-09 NOTE — Progress Notes (Signed)
Patient ID: Sydney Coleman, female   DOB: 25-Feb-1951, 65 y.o.   MRN: [id number] Subjective:  I was called and notified that the patient's ventriculostomy had been accidentally pulled out  Objective: Vital signs in last 24 hours: Temp:  [97.7 F (36.5 C)-101.6 F (38.7 C)] 97.7 F (36.5 C) (11/26 2000) Pulse Rate:  [78-104] 81 (11/26 2200) Resp:  [12-28] 18 (11/26 2200) BP: (89-172)/(42-87) 150/85 (11/26 2200) SpO2:  [97 %-100 %] 100 % (11/26 2200) Arterial Line BP: (91-169)/(51-74) 155/71 (11/26 2200) FiO2 (%):  [30 %] 30 % (11/26 2000) Weight:  [64.5 kg (142 lb 3.2 oz)] 64.5 kg (142 lb 3.2 oz) (11/26 0400)  Intake/Output from previous day: 11/25 0701 - 11/26 0700 In: 5852.5 [I.V.:3992.5; NG/GT:1200; IV Piggyback:410] Out: [id number] [Urine:3550; Drains:34] Intake/Output this shift: Total I/O In: 775 [I.V.:225; NG/GT:150; IV Piggyback:400] Out: 425 [Urine:425]  Physical exam is Glascow coma scale 10 intubated, E4M5V1. The patient localizes on the left. Her pupils are equal.  I put several staples at the drain exit site. There was no leakage of spinal fluid. Lab Results:  Recent Labs  09/08/16 0500 09/09/16 0420  WBC 10.2 10.6*  HGB 9.3* 9.5*  HCT 27.8* 28.3*  PLT 173 167   BMET  Recent Labs  09/08/16 0500 09/09/16 0420  NA 143 140  K 2.7* 3.6  CL 113* 110  CO2 23 23  GLUCOSE 162* 146*  BUN 16 10  CREATININE 0.46 0.38*  CALCIUM 7.6* 7.5*    Studies/Results: Dg Chest Port 1 View  Result Date: 09/08/2016 CLINICAL DATA:  Respiratory failure EXAM: PORTABLE CHEST 1 VIEW COMPARISON:  09/04/2016 FINDINGS: Endotracheal tube tip 3.7 cm above the carina. Right PICC line tip: SVC. Feeding tube extends down into the stomach. Atherosclerotic calcification of the aortic arch. Heart size within normal limits. The lungs appear clear. Capsular calcification of breast implants. IMPRESSION: 1. Tubes and lines are in satisfactorily position. There is a new right-sided PICC line with  tip projecting over the SVC. 2. Atherosclerotic calcification of the aortic arch. Electronically Signed   By: Van Clines M.D.   On: 09/08/2016 08:58    Assessment/Plan: Intracerebral abscess, ventriculitis: We will continue antibiotics. I will plan to repeat her CAT scan tomorrow to see if she needs to have the ventriculostomy reinserted.  LOS: 8 days     Sydney Coleman D 09/09/2016, 10:41 PM

## 2016-09-09 NOTE — Progress Notes (Signed)
STROKE TEAM PROGRESS NOTE   SUBJECTIVE (INTERVAL HISTORY) Family is at the bedside. ID is following. NSG is managing drain. . Patient is more arousable this morning, opens eyes, blinks to threat, not following commands, she is intubated and weaning.  Possible plan to remove the ventriculostomy tomorrow.    OBJECTIVE Temp:  [98.6 F (37 C)-101.8 F (38.8 C)] 99 F (37.2 C) (11/26 0800) Pulse Rate:  [78-107] 87 (11/26 0800) Cardiac Rhythm: Normal sinus rhythm (11/26 0754) Resp:  [12-31] 26 (11/26 0800) BP: (87-176)/(42-82) 138/80 (11/26 0800) SpO2:  [100 %] 100 % (11/26 0800) Arterial Line BP: (84-190)/(56-83) 163/72 (11/26 0800) FiO2 (%):  [30 %] 30 % (11/26 0735) Weight:  [64.5 kg (142 lb 3.2 oz)] 64.5 kg (142 lb 3.2 oz) (11/26 0400)  CBC:   Recent Labs Lab 09/02/16 1400  09/08/16 0500 09/09/16 0420  WBC 14.8*  < > 10.2 10.6*  NEUTROABS 13.1*  --   --   --   HGB 15.3*  < > 9.3* 9.5*  HCT 44.1  < > 27.8* 28.3*  MCV 94.6  < > 95.5 94.6  PLT 359  < > 173 167  < > = values in this interval not displayed.  Basic Metabolic Panel:   Recent Labs Lab 09/03/2016 1000  09/08/16 0500 09/09/16 0420  NA 156*  < > 143 140  K 3.0*  < > 2.7* 3.6  CL 122*  < > 113* 110  CO2 26  < > 23 23  GLUCOSE 137*  < > 162* 146*  BUN 21*  < > 16 10  CREATININE 0.46  < > 0.46 0.38*  CALCIUM 8.3*  < > 7.6* 7.5*  MG 1.9  --  1.8  --   PHOS 2.3*  --  2.0*  --   < > = values in this interval not displayed.  Lipid Panel:     Component Value Date/Time   CHOL 105 09/02/2016 0115   TRIG 117 09/05/2016 0426   HDL 64 09/02/2016 0115   CHOLHDL 1.6 09/02/2016 0115   VLDL 10 09/02/2016 0115   LDLCALC 31 09/02/2016 0115   HgbA1c:  Lab Results  Component Value Date   HGBA1C 5.7 (H) 09/02/2016   Urine Drug Screen:     Component Value Date/Time   LABOPIA POSITIVE (A) 08/20/2016 0958   COCAINSCRNUR NONE DETECTED 08/22/2016 0958   COCAINSCRNUR NONE DETECTED 05/11/2015 2037   LABBENZ NONE  DETECTED 08/29/2016 0958   AMPHETMU NONE DETECTED 09/04/2016 0958   THCU NONE DETECTED 09/06/2016 0958   LABBARB NONE DETECTED 09/09/2016 0958      IMAGING I have personally reviewed the radiological images below and agree with the radiology interpretations.  Ct Head Wo Contrast 09/07/2016 1. Status post left parietal craniectomy and resection of left frontoparietal hematoma. Small volume of acute hemorrhage within the resection cavity, subarachnoid hemorrhage of surrounding sulci, and trace subdural along posterior falx and left tentorium. 2. Drainage catheter traversing the resection cavity, entering left lateral ventricle, with tip in periventricular white matter of left frontal lobe. 3. Small new acute hemorrhage within the left thalamus. 4. Mild decrease in size of lateral ventricles. Persistent hypoattenuation of ventricular margins likely representing edema. 5. Small volume of pneumocephalus over right greater than left frontal lobes with mass effect effacing sulci and minimal right-to-left midline shift.   Ct Head Wo Contrast 08/20/2016 1. Unchanged small amounts of superior parasagittal left convexity and right occipital subarachnoid blood.  2. No new areas of hemorrhage.  3. Unchanged appearance of left parietal packing material. No midline shift, new mass effect or hydrocephalus.  4. Expected evolution of ischemia within the left hemisphere lower lobe predominantly involving the white matter and extending to the left frontal cortex.   Ct Head Wo Contrast 08/31/2016 1. Likely subarachnoid hemorrhage at the left convexity, and also at the right occipital lobe. Packing material at the left convexity, reflecting recent evacuation of a hematoma.  2. Evolving subacute infarct at the left cerebral hemisphere, primarily involving the white matter.  3. Chronic encephalomalacia at the white matter of the right frontal lobe, reflecting remote infarct. Scattered small vessel ischemic  microangiopathy.  4. Mild partial opacification of the right mastoid air cells.    Mr [name] Contrast 09/02/2016   1. Subacute parenchymal hematoma in the left cerebral hemisphere, the largest is along the left frontal parietal junction has edema, local mass effect, and measures up to 7 cm.  2. Subacute hemorrhage or less likely debris in infratentorial cisterns and lateral ventricles. Mild lateral ventricular hydrocephalus when compared to 2016 comparison.  3. Periventricular T2 hyperintensity is likely a combination of CSF re- absorption and the chronic microvascular disease that was seen in 2016.  4. Superficial siderosis around the cerebral convexities, progressed from 2016. Is there known bleeding diastasis or vascular lesion?   ADDENDUM: Case discussed with Dr. Leonie Man today, patient has positive blood culture and purulence on LP today. Additionally, the patient's surgery was noted to be ~4 weeks ago. 1. The larger of 2 left cerebral hematomas is likely superinfected, especially given history of surgery 1 month ago - which makes the vasogenic edema unexpected. 2cm area of presumably hemostatic material in this cavity is also more suspicious in this setting, and may be a nidus of infection. Will be glad to correlate with operative report when it become available. 2. Although there is a suprising lack of ependymal and leptomeningeal enhancement for ventriculitis/meningitis, CSF results implies the intraventricular and subarachnoid material is infectious debris. Lateral ventriculomegaly as previously noted.   EEG - This is an abnormal EEG due to moderate to severe diffuse slowing. This is consistent with a global encephalopathic process, nonspecific as to etiology. The greater focal slowing in the posterior L hemisphere is consistent with known hemorrhage in that area. There is no evidence of seizure or epileptiform activity to suggest propensity for seizure on this recording.   Repeat EEG  09/04/16 -  This EEG is abnormal due to moderate diffuse slowing of the background. Diffuse cerebral dysfunction that is non-specific in etiology and can be seen with hypoxic/ischemic injury, toxic/metabolic encephalopathies, or medication effect.  There were no electrographic seizures seen in this study. Clinical correlation is advised.  Mr [name] Contrast  09/02/2016 1. Diffuse leptomeningeal enhancement and incomplete FLAIR suppression of sulci over convexities, cerebellar folia, and along the surface of the brainstem, including regions without susceptibility signal abnormality, probably represents a combination of redistribution of subarachnoid hemorrhage and meningitis. This appears progressed on the FLAIR sequence.  2. Periventricular enhancement, increasing periventricular T2 FLAIR hyperintense signal abnormality, and debris within occipital horns of ventricles without typical evolution of blood signal is suspicious for ventriculitis and ventricular empyema.  3. Stable hemorrhages within the left paramedian anterior frontal and left posterior frontal parietal lobes. Enhancement and margin of hemorrhages is nonspecific can be seen as a reaction to hemorrhage or infection.  4. Stable mild enlargement of the lateral and third ventricles probably represents a component of hydrocephalus.  5.  Several punctate foci of susceptibility hypointensity in the supratentorial brain compatible with microhemorrhage are new from prior MR.  6. No acute infarct of the brain identified. No herniation or significant midline shift.     DG Chest Portable 1 View 09/08/2016 1. Tubes and lines are in satisfactorily position. There is a new right-sided PICC line with tip projecting over the SVC. 2. Atherosclerotic calcification of the aortic arch.    Microbiology results per pharmacy: 11/17 blood cx 2/2: serratia 11/18 urine cx: multiple 11/19 csf: GNR on GS, neg cx 11/19 VDRL - ip 11/19 HIV, HSV, Crypto Ag  - neg 11/19 blood cx: NGTD 11/19 fungal smear: NGTD 11/18 MRSA - negative 11/17 Urine - Ecoli 11/19 VDRL - ip 11/23 abscess wound - serratia resistant to ancef 11/23 abscess fluid - serratia resistant to ancef  PHYSICAL EXAM  Temp:  [98.6 F (37 C)-101.8 F (38.8 C)] 99 F (37.2 C) (11/26 0800) Pulse Rate:  [78-107] 87 (11/26 0800) Resp:  [12-31] 26 (11/26 0800) BP: (87-176)/(42-82) 138/80 (11/26 0800) SpO2:  [100 %] 100 % (11/26 0800) Arterial Line BP: (84-190)/(56-83) 163/72 (11/26 0800) FiO2 (%):  [30 %] 30 % (11/26 0735) Weight:  [64.5 kg (142 lb 3.2 oz)] 64.5 kg (142 lb 3.2 oz) (11/26 0400)  No changes in exam today, neurologically stable  General - Well nourished, well developed, intubated and on low dose propofol.  Ophthalmologic - Fundi not visualized due to noncooperation.  Cardiovascular - Regular rate and rhythm.  Neuro - intubated and on low dose propofol, eyes open, tracking to objects intermittently and blinking to visual threat bilaterally, however, not following commands. Eyes in the middle position and doll's eye sluggish, PERRL. Positive corneal and gag. On pain stimulation, RUE 0/5, RLE 2/5 on pain, LLE 3/5 on pain, and LUE intermittent spontaneous movement. DTR 1+ and no babinski. Sensation, coordination and gait not tested.    ASSESSMENT/PLAN Ms. Sydney Coleman is a 65 y.o. female with history of previous TIA, previous stroke, previous subarachnoid hemorrhage, hypertension, hypothyroidism, hyperlipidemia, COPD, chronic headaches, and anxiety presenting with nausea, vomiting, left gaze preference, altered mental status, and no longer speaking.  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  Surgical I&D - 08/17/2016 - POD #3 - NSG following - Per Dr Arnoldo Morale - possibly remove ventriculostomy tomorrow.  Ct Head good evacuation - EVD position corrected  ID  on board  Continue EVD drainage  Abscess cultures - SERRATIA MARCESCENS - Cefazolin resistant  ID and pharmacy on board, appreciate recs.  Tressie Ellis Day #2 - Flagyl Day # 2  Bacterial meningitis / encephalitis (no acute SAH this time)- hematogenous source from bacteremia vs. local process due to recent surgical procedure.   Resultant - fever, chill, leukocytosis, AMS  LP consistent with bacterial meningitis  BCx positive for serratia marcescens - pan sensitive except cefazolin.    CSF culture NGTD  CT head - concerning for small SAH x2 - left medial frontal and right inferior parietal, but most likely the proteinaceous secretions  MRI  - concerning for pus at above sites  Repeat MRI brain 09/05/16 - progressive ventriculitis and meningitis with increasing brain abscesses  WBC trending down 22.7-19.6-17.6-14.8-10.2-11.1-9.5 - 12.0 -> 10.2  febrile Tmax 102.8 - 100 -> 101.8 -> 99  ID on board  On Fortaz and Metronidazole now - Abx for 6 weeks duration  No need for TEE to rule out endocarditis as  G neg rods do not generally cause endocarditis.   ? Seizure  2-3 episodes of left UE shaking/tremor 09/03/16. Today with rhythmic left should movements with repositioning likely myoclonus will monitor  EEG x 2 - diffuse slowing  Continue keppra to 1000 mg bid  Hypernatremia  Na 145-149-153-154 -156 - 154 - 149 - 156 - 143 - 140  Likely due to dehydration with fever, ventilation and limited fluid intack  continue IVF   Close monitoring  Left pareital hemorrhage s/p craniotomy 4 weeks ago   Resultant  aphasia and dense right hemiplegia  CT head - encephalomacia at left hemisphere  UDS - positive for opiates  LDL - 31  HgbA1c -  5.7  VTE prophylaxis - subq heparin Diet NPO time specified - tube feedings  aspirin 81 mg daily prior to admission, now on ASA 81mg  daily  Ongoing aggressive stroke risk factor management  Therapy recommendations: pending  Disposition:  Pending  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 Krista Blue in clinic.  Unclear etiology at that time  Hypertension  Stable  BP goal < 160  126/87 Sunday AM  Hyperlipidemia  Home meds:  Pravachol 20 mg daily prior to admission  LDL 31  Resume statin on discharge  Tobacco abuse  Current smoker  Smoking cessation counseling will be provided  Other Stroke Risk Factors  Advanced age  Hx stroke/TIA  Family hx stroke (mother)  Other Active Problems  Hypokalemia - 3.4 -> 3.0 -> 4.0-> 3.3 -> 3.7 -> 2.7 - supplemented -> 3.6  Leukocytosis - 22.7 -> 19.6 -> 17.6 ->14.8 -> 13.0->10.2->11.2 -> 9.5 -> 12.0 -> 10.2  Hx of elevated serum homocysteine in 2016 - repeat homocysteine 8.3  Anemia - 9.3 / 27.8  Tube Feedings - 50 ml / hr (access for medications)   Hospital day # 8  This patient is critically ill due to bacterial meningitis, evacuation of abscess, recent left ICH s/p craniectomy, UTI and bacteremia, seizure and at significant risk of neurological worsening, death form sepsis, vasospasm, status epilepticus, recurrent brain bleed. This patient's care requires constant monitoring of vital signs, hemodynamics, respiratory and cardiac monitoring, review of multiple databases, neurological assessment, discussion with family, other specialists and medical decision making of high complexity. I spent 40 minutes of neurocritical care time in the care of this patient.   Personally examined patient and images, and have participated in and made any corrections needed to history, physical, neuro exam,assessment and plan as stated above.  I have personally obtained the history, evaluated lab date, reviewed imaging studies and agree with radiology interpretations.    Sarina Ill, MD Stroke Neurology      To contact Stroke Continuity provider, please refer to [email or web address] After hours, contact General Neurology

## 2016-09-09 NOTE — Progress Notes (Signed)
Patient ID: Sydney Coleman, female   DOB: 1951/08/10, 65 y.o.   MRN: [id number] Subjective:  The patient is without change. Her husband is at bedside. I answered all his questions.  Objective: Vital signs in last 24 hours: Temp:  [98.6 F (37 C)-101.8 F (38.8 C)] 99 F (37.2 C) (11/26 0800) Pulse Rate:  [78-107] 87 (11/26 0800) Resp:  [12-31] 26 (11/26 0800) BP: (87-176)/(42-82) 138/80 (11/26 0800) SpO2:  [100 %] 100 % (11/26 0800) Arterial Line BP: (84-190)/(56-83) 163/72 (11/26 0800) FiO2 (%):  [30 %] 30 % (11/26 0735) Weight:  [64.5 kg (142 lb 3.2 oz)] 64.5 kg (142 lb 3.2 oz) (11/26 0400)  Intake/Output from previous day: 11/25 0701 - 11/26 0700 In: 5852.5 [I.V.:3992.5; NG/GT:1200; IV Piggyback:410] Out: [id number] [Urine:3550; Drains:34] Intake/Output this shift: Total I/O In: 125 [I.V.:75; NG/GT:50] Out: 381 [Urine:375; Drains:6]  Physical exam the patient follows commands on the left. Her pupils are equal. Her ventriculostomy is draining.  Lab Results:  Recent Labs  09/08/16 0500 09/09/16 0420  WBC 10.2 10.6*  HGB 9.3* 9.5*  HCT 27.8* 28.3*  PLT 173 167   BMET  Recent Labs  09/08/16 0500 09/09/16 0420  NA 143 140  K 2.7* 3.6  CL 113* 110  CO2 23 23  GLUCOSE 162* 146*  BUN 16 10  CREATININE 0.46 0.38*  CALCIUM 7.6* 7.5*    Studies/Results: Dg Chest Port 1 View  Result Date: 09/08/2016 CLINICAL DATA:  Respiratory failure EXAM: PORTABLE CHEST 1 VIEW COMPARISON:  08/20/2016 FINDINGS: Endotracheal tube tip 3.7 cm above the carina. Right PICC line tip: SVC. Feeding tube extends down into the stomach. Atherosclerotic calcification of the aortic arch. Heart size within normal limits. The lungs appear clear. Capsular calcification of breast implants. IMPRESSION: 1. Tubes and lines are in satisfactorily position. There is a new right-sided PICC line with tip projecting over the SVC. 2. Atherosclerotic calcification of the aortic arch. Electronically Signed   By: Van Clines M.D.   On: 09/08/2016 08:58    Assessment/Plan: Postop day #3: The patient is neurologically stable. We will continue with supportive care. Perhaps we can remove the ventriculostomy tomorrow.  LOS: 8 days     [name] D 09/09/2016, 9:08 AM

## 2016-09-10 ENCOUNTER — Inpatient Hospital Stay (HOSPITAL_COMMUNITY): Payer: Medicare Other

## 2016-09-10 DIAGNOSIS — E871 Hypo-osmolality and hyponatremia: Secondary | ICD-10-CM

## 2016-09-10 DIAGNOSIS — G039 Meningitis, unspecified: Secondary | ICD-10-CM

## 2016-09-10 DIAGNOSIS — G0490 Encephalitis and encephalomyelitis, unspecified: Secondary | ICD-10-CM

## 2016-09-10 DIAGNOSIS — B9689 Other specified bacterial agents as the cause of diseases classified elsewhere: Secondary | ICD-10-CM

## 2016-09-10 DIAGNOSIS — A86 Unspecified viral encephalitis: Secondary | ICD-10-CM

## 2016-09-10 DIAGNOSIS — A498 Other bacterial infections of unspecified site: Secondary | ICD-10-CM

## 2016-09-10 LAB — GLUCOSE, CAPILLARY
GLUCOSE-CAPILLARY: 105 mg/dL — AB (ref 65–99)
GLUCOSE-CAPILLARY: 151 mg/dL — AB (ref 65–99)
Glucose-Capillary: 139 mg/dL — ABNORMAL HIGH (ref 65–99)
Glucose-Capillary: 153 mg/dL — ABNORMAL HIGH (ref 65–99)
Glucose-Capillary: 151 mg/dL — ABNORMAL HIGH (ref 65–99)
Glucose-Capillary: 138 mg/dL — ABNORMAL HIGH (ref 65–99)

## 2016-09-10 MED ORDER — SODIUM CHLORIDE 0.9 % IV SOLN
200.0000 mg | INTRAVENOUS | Status: DC
  Administered 2016-09-10 – 2016-09-11 (×2): 200 mg via INTRAVENOUS
  Filled 2016-09-10 (×4): qty 200

## 2016-09-10 NOTE — Care Management Important Message (Signed)
Important Message  Patient Details  Name: Sydney Coleman MRN: [id number] Date of Birth: [birth year]   Medicare Important Message Given:  Other (see comment)    [place], Ilka Lovick Abena 09/10/2016, 11:40 AM

## 2016-09-10 NOTE — Progress Notes (Signed)
Nutrition Follow-up  INTERVENTION:   Continue:  Vital AF 1.2 @ 50 ml/hr (1200 ml/day) via Cortrak Provides: 1440 kcal, 90 grams protein, and 973 ml H2O.   NUTRITION DIAGNOSIS:   Inadequate oral intake related to inability to eat as evidenced by NPO status. Ongoing.   GOAL:   Patient will meet greater than or equal to 90% of their needs Met.   MONITOR:   TF tolerance, I & O's, Vent status  ASSESSMENT:   Pt with hx of hemorrhagic stroke 11/1 s/p crani with clot evacuation in [facility] who had been at [facility] (SNF) for rehab. Admitted with altered mental status and per CT has two new subarachnoid hemorrhages and CNS infection.   Pt discussed during ICU rounds and with RN.   Cortrak tube in place, tip at LOT Weight up 16 lb since admission weight, pt is also positive 11.6 L Patient is currently intubated on ventilator support MV: 7.6 L/min Temp (24hrs), Avg:99.1 F (37.3 C), Min:97.7 F (36.5 C), Max:101.6 F (38.7 C)  Medications reviewed and include: senokot-s, D51/2NS @ 75 with KCl CBG's: [phone]  Diet Order:  Diet NPO time specified  Skin:  Reviewed, no issues (right head and knee incisions)  Last BM:  200 ml via rectal tube in the last 24 hours  Height:   Ht Readings from Last 1 Encounters:  08/31/2016 5' 3"  (1.6 m)    Weight:   Wt Readings from Last 1 Encounters:  09/10/16 145 lb 11.6 oz (66.1 kg)    Ideal Body Weight:  52.2 kg  BMI:  Body mass index is 25.81 kg/m.  Estimated Nutritional Needs:   Kcal:  1448  Protein:  75-90 grams  Fluid:  >1.5 L/day  EDUCATION NEEDS:   No education needs identified at this time  [place], [place], [place] Pager [phone] After Hours Pager

## 2016-09-10 NOTE — Progress Notes (Signed)
[name] for Infectious Disease    Date of Admission:  09/13/2016   Total days of antibiotics 11        Day 5 ceftaz        Day 3 metronidazole           ID: Sydney Coleman is a 65 y.o. female with 65 y.o. 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 LP c/w with CNS infection and has serratia bloodstream infection s/p redo craniotomy which exposed multiple areas of purulence in the brain as well as in the ventricle status post placement of frontoparietal ventricular drain. Cultures from the intracerebral abscess or showing gram-negative rods. 11/23 also shows c.glabrata. Ventriculostomy removed accidentally Active Problems:   Subarachnoid (nontraumatic) hemorrhage of newborn   Subarachnoid hemorrhage (HCC)   Superficial siderosis of central nervous system   Bacteremia   Brain abscess   Acute respiratory failure (HCC)   Bacterial meningitis   Endotracheally intubated   History of intracranial hemorrhage   History of craniotomy   Acute respiratory failure with hypoxia (HCC)   Serratia infection   Cerebral ventriculitis    Subjective: Tc 110.3 still remains febrile. Had CT to evaluate ventricles since ventricullostomy pulled out on 11/26  Medications:  . anidulafungin  200 mg Intravenous Q24H  . aspirin  81 mg Oral Daily  . cefTAZidime (FORTAZ)  IV  2 g Intravenous Q8H  . chlorhexidine gluconate (MEDLINE KIT)  15 mL Mouth Rinse BID  . heparin subcutaneous  5,000 Units Subcutaneous Q8H  . insulin aspart  2-6 Units Subcutaneous Q4H  . levETIRAcetam  1,000 mg Intravenous Q12H  . levothyroxine  25 mcg Per Tube QAC breakfast  . mouth rinse  15 mL Mouth Rinse 10 times per day  . metronidazole  500 mg Intravenous Q6H  . pantoprazole (PROTONIX) IV  40 mg Intravenous QHS  . senna-docusate  1 tablet Oral BID  . sodium chloride flush   10-40 mL Intracatheter Q12H  . cyanocobalamin  100 mcg Oral Daily    Objective: Vital signs in last 24 hours: Temp:  [97.7 F (36.5 C)-101.6 F (38.7 C)] 100.3 F (37.9 C) (11/27 1200) Pulse Rate:  [80-104] 91 (11/27 1300) Resp:  [14-28] 26 (11/27 1300) BP: (99-157)/(53-89) 128/73 (11/27 1300) SpO2:  [97 %-100 %] 97 % (11/27 1300) Arterial Line BP: (91-162)/(51-78) 145/65 (11/27 1200) FiO2 (%):  [30 %] 30 % (11/27 1102) Weight:  [145 lb 11.6 oz (66.1 kg)] 145 lb 11.6 oz (66.1 kg) (11/27 0431) Physical Exam  Constitutional:  sedated. On vent. No distress.  HENT: PERRLA, no scleral icterus,  Mouth/Throat: OETT in place Cardiovascular: Normal rate, regular rhythm and normal heart sounds. Exam reveals no gallop and no friction rub.  No murmur heard.  Pulmonary/Chest: Effort normal and breath sounds normal. No respiratory distress.  has no wheezes.  Abdominal: Soft. Bowel sounds are decreased  exhibits no distension. There is no tenderness.rectal tube in place. Urinary catheter in place  Neurological: moves upper extremities to light touch. upgoing toes. Does not follow command  Skin: Skin is warm and dry. No rash noted. No erythema.    Lab Results  Recent Labs  09/08/16 0500 09/09/16 0420  WBC 10.2 10.6*  HGB 9.3* 9.5*  HCT 27.8* 28.3*  NA 143 140  K 2.7* 3.6  CL 113* 110  CO2 23 23  BUN 16 10  CREATININE 0.46 0.38*   Sedimentation Rate  Recent Labs  09/08/16 0500  ESRSEDRATE 67*   C-Reactive Protein  Recent Labs  09/08/16 0500  CRP 8.4*   Microbiology: 11/24 blood cx  11/23 CNS cx = serratia and c.glabrata Studies/Results: Ct Head Wo Contrast  Result Date: 09/10/2016 CLINICAL DATA:  Patient pulled out ventriculostomy. Evaluate for change in ventricle size. EXAM: CT HEAD WITHOUT CONTRAST TECHNIQUE: Contiguous axial images were obtained from the base of the skull through the vertex without intravenous contrast. COMPARISON:  Three days ago FINDINGS: Brain:  Interval increase in lateral ventricular volume after removal ventriculostomy. The temporal horns are most prominently dilated. Stable blood products in the left parietal cavity and left thalamic region. Edema in this area is stable to improved. Stable anterior left frontal low density, a small hematoma by MRI. Subarachnoid and ventricular debris that is subtle by CT. No evidence of progression. Decreasing pneumocephalus. No midline shift. No evidence of acute cortical infarct. Vascular: No acute finding Skull: Left parietal craniectomy site is stable Sinuses/Orbits: Bilateral cataract resection. IMPRESSION: 1. Mild increase in lateral ventricular volume after ventriculostomy removal. The temporal horns are mildly dilated; no generalized ventriculomegaly. 2. Elsewhere stable exam. Electronically Signed   By: Monte Fantasia M.D.   On: 09/10/2016 11:56     Assessment/Plan: Polymicrobial brain abscess with meningitis/ventriculitis thought to be 2nd bacterial/fungal infection from initial ICH. - cultures have isolated serratia, and now c.glabrata. = will add anidulafungin in addn to ceftaz and metronidazole. Length of course maybe dependent on her follow up imaging. Recommend minimum of 4 wk. Still has ongoing fever, suggesting still large burden of disease. She had repeat head ct showing mild increase in lateral ventricles since losing ventriculostomy. Defer to dr Arnoldo Morale for need for new drain.  Will check c.glabrata sensitivities to see if can do high dose fluconazole  Hyponatremia = worsened over the last 2 days. Recommend work up as it can also contribute to encephalopathy  Neurologic insult from recent [place], and infection = defer to neurology for goals of care.  Respiratory distress requiring vent support = family reported that she would not want trach or long term support  Family discussing goals of care.  [facility] for Infectious Diseases Cell: [phone] Pager:  [phone]  09/10/2016, 2:17 PM

## 2016-09-10 NOTE — Progress Notes (Signed)
STROKE TEAM PROGRESS NOTE   SUBJECTIVE (INTERVAL HISTORY) Family at bedside. No significant changes over night. NS to consider replacement of drain today after CT. Dr. Leonie Coleman discussed with them the severity of the infection and planned treatment. Discussed expected deficits once she improved from infection. Discussed anticipate limited quality of life - discussed trach and PEG.    OBJECTIVE Temp:  [97.7 F (36.5 C)-101.6 F (38.7 C)] 98.9 F (37.2 C) (11/27 0800) Pulse Rate:  [80-104] 82 (11/27 0900) Cardiac Rhythm: Normal sinus rhythm (11/27 0800) Resp:  [14-28] 20 (11/27 0900) BP: (99-165)/(53-89) 133/89 (11/27 0900) SpO2:  [97 %-100 %] 100 % (11/27 0900) Arterial Line BP: (91-166)/(51-78) 143/65 (11/27 0900) FiO2 (%):  [30 %] 30 % (11/27 0836) Weight:  [66.1 kg (145 lb 11.6 oz)] 66.1 kg (145 lb 11.6 oz) (11/27 0431)  CBC:   Recent Labs Lab 09/08/16 0500 09/09/16 0420  WBC 10.2 10.6*  HGB 9.3* 9.5*  HCT 27.8* 28.3*  MCV 95.5 94.6  PLT 173 [id number]    Basic Metabolic Panel:   Recent Labs Lab 09/11/2016 1000  09/08/16 0500 09/09/16 0420  NA 156*  < > 143 140  K 3.0*  < > 2.7* 3.6  CL 122*  < > 113* 110  CO2 26  < > 23 23  GLUCOSE 137*  < > 162* 146*  BUN 21*  < > 16 10  CREATININE 0.46  < > 0.46 0.38*  CALCIUM 8.3*  < > 7.6* 7.5*  MG 1.9  --  1.8  --   PHOS 2.3*  --  2.0*  --   < > = values in this interval not displayed.  Lipid Panel:     Component Value Date/Time   CHOL 105 09/02/2016 0115   TRIG 117 09/05/2016 0426   HDL 64 09/02/2016 0115   CHOLHDL 1.6 09/02/2016 0115   VLDL 10 09/02/2016 0115   LDLCALC 31 09/02/2016 0115   HgbA1c:  Lab Results  Component Value Date   HGBA1C 5.7 (H) 09/02/2016   Urine Drug Screen:     Component Value Date/Time   LABOPIA POSITIVE (A) 08/19/2016 0958   COCAINSCRNUR NONE DETECTED 09/10/2016 0958   COCAINSCRNUR NONE DETECTED 05/11/2015 2037   LABBENZ NONE DETECTED 09/10/2016 0958   AMPHETMU NONE DETECTED 08/31/2016  0958   THCU NONE DETECTED 09/12/2016 0958   LABBARB NONE DETECTED 09/13/2016 0958      IMAGING No results found.    Microbiology results per pharmacy: 11/17 blood cx 2/2: serratia 11/18 urine cx: multiple 11/19 csf: GNR on GS, neg cx 11/19 VDRL - ip 11/19 HIV, HSV, Crypto Ag - neg 11/19 blood cx: NGTD 11/19 fungal smear: NGTD 11/18 MRSA - negative 11/17 Urine - Ecoli 11/19 VDRL - ip 11/23 abscess wound - serratia resistant to ancef 11/23 abscess fluid - serratia resistant to ancef 11/24 blood no growth x 2 days   PHYSICAL EXAM General - Well nourished, well developed, intubated and on low dose propofol. Ophthalmologic - Fundi not visualized due to noncooperation. Cardiovascular - Regular rate and rhythm. Neuro - intubated and on low dose propofol, eyes open, tracking to objects intermittently and blinking to visual threat bilaterally, however, not following commands. Eyes in the middle position and doll's eye sluggish, PERRL. Positive corneal and gag. On pain stimulation, RUE 0/5, RLE 2/5 on pain, LLE 3/5 on pain, and LUE intermittent spontaneous movement. DTR 1+ and no babinski. Sensation, coordination and gait not tested.    ASSESSMENT/PLAN Ms. Sydney Justice  Coleman is a 65 y.o. female with history of previous TIA, previous stroke, previous subarachnoid hemorrhage, hypertension, hypothyroidism, hyperlipidemia, COPD, chronic headaches, and anxiety presenting with nausea, vomiting, left gaze preference, altered mental status, and no longer speaking.  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  Surgical I&D - 08/15/2016 - POD #4 - NSG following -  Dr Sydney Coleman   Ct Head good evacuation  NS to eval today for reinsertion EVD   ID on board  Abscess cultures - SERRATIA MARCESCENS - Cefazolin resistant  ID and pharmacy on board, appreciate recs.  Sydney Coleman Day #3 - Flagyl Day #  3  Bacterial meningitis / encephalitis / ventriculitis (no acute SAH this time)- hematogenous source from bacteremia vs. local process due to recent surgical procedure.   Resultant - fever, chill, leukocytosis, AMS  LP consistent with bacterial meningitis  BCx positive for serratia marcescens - pan sensitive except cefazolin.    CSF culture NGTD  CT head - concerning for small SAH x2 - left medial frontal and right inferior parietal, but most likely the proteinaceous secretions  MRI  - concerning for pus at above sites  Repeat MRI brain 09/05/16 - progressive ventriculitis and meningitis with increasing brain abscesses  WBC trending down 10.6  febrile Tmax 101.6  ID on board  On Fortaz and Metronidazole now - Abx for 8 weeks duration  No need for TEE to rule out endocarditis as G neg rods do not generally cause endocarditis.   ? Seizure  2-3 episodes of left UE shaking/tremor 09/03/16. Today with rhythmic left should movements with repositioning likely myoclonus will monitor  EEG x 2 - diffuse slowing  Continue keppra to 1000 mg bid  Hypernatremia  Na 140  Likely due to dehydration with fever, ventilation and limited fluid intack  continue IVF   Close monitoring  Left pareital hemorrhage s/p craniotomy 4 weeks ago   Resultant  aphasia and dense right hemiplegia  CT head - encephalomacia at left hemisphere  UDS - positive for opiates  LDL - 31  HgbA1c -  5.7  VTE prophylaxis - subq heparin Diet NPO time specified - tube feedings  aspirin 81 mg daily prior to admission, now on ASA 81mg  daily  Ongoing aggressive stroke risk factor management  Therapy recommendations: pending  Disposition: Pending  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  Angio negative  Followed with Sydney Coleman in clinic.  Unclear etiology at that time  Hypertension  Stable  At BP goal < 160  Hyperlipidemia  Home meds:  Pravachol 20 mg daily prior to  admission  LDL 31  Resume statin on discharge  Tobacco abuse  Current smoker  Smoking cessation counseling will be provided  Other Stroke Risk Factors  Advanced age  Hx stroke/TIA  Family hx stroke (mother)  Other Active Problems  Hypokalemia - 3.6  Leukocytosis - 10.6  Hx of elevated serum homocysteine in 2016 - repeat homocysteine 8.3  Anemia - 9.5 / 28.3  Tube Feedings - 50 ml / hr (access for medications)  SVT 11/27, asx  Hospital day # 9 I have personally examined this patient, reviewed notes, independently viewed imaging studies, participated in medical decision making and plan of care.ROS completed by me personally and pertinent positives fully documented  I have made any additions or clarifications directly to the above note. Plan discussed with Dr. Arnoldo Coleman to repeat CT scan of the head  today and then decide on wether ventriculostomy needs to be replaced. I had a long discussion with the patient's husband and daughter at the bedside regarding her prognosis and expected neurological deficits and answered questions. Family will need to decide on tracheostomy and PEG tube over the next few days. They voiced understanding.  This patient is critically ill and at significant risk of neurological worsening, death and care requires constant monitoring of vital signs, hemodynamics,respiratory and cardiac monitoring, extensive review of multiple databases, frequent neurological assessment, discussion with family, other specialists and medical decision making of high complexity.I have made any additions or clarifications directly to the above note.This critical care time does not reflect procedure time, or teaching time or supervisory time of PA/NP/Med Resident etc but could involve care discussion time.  I spent 35 minutes of neurocritical care time  in the care of  this patient.     Antony Contras, MD Medical Director [facility] Pager: [phone] 09/10/2016  12:51 PM   To contact Stroke Continuity provider, please refer to [email or web address] After hours, contact General Neurology

## 2016-09-10 NOTE — Progress Notes (Signed)
PULMONARY / CRITICAL CARE MEDICINE   Name: Sydney Coleman MRN: [id number] DOB: November 01, 1950    ADMISSION DATE:  08/18/2016 CONSULTATION DATE:  09/13/2016  REFERRING MD:  Neuro /Dr. Cheral Marker   CHIEF COMPLAINT:  SAH   SUMMARY:   65 y/o F admitted to Neuro ICU on 09/12/2016 with new subarachnoid hemorrhages. 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 She was brought in to ER for gradually worsening mental status that started morning of 08/31/16 . She was at [facility] for rehabilitation and her family reported that her baseline mental status was alert and oriented times three, conversant and able to eat without difficulty. They reported that the patient started complaining of nausea and vomiting. Reportedly she was starting to act unusual and be less responsive at 8 AM on 11/17/17including some left gaze preference but not consistently so. Later in day on 08/31/16 she became nonverbal and acutely ill. She was transported by EMS for further evaluation.   She was taken to [facility] ER where CT head revealed two new subarachnoid hemorrhages, one involving sulci of the medial left frontal lobe and one involving sulci along the right occipital lobe. Also noted on OSH CT was recent craniotomy defect with encephalomalacia in the adjacent left parietofrontal region, consistent with evacuation of recent prior lobar hemorrhage. Chronic small vessel ischemic changes were also noted, evolving subacute infarct of left cerebral hemisphere . She was transferred to [facility] hospital to Neuro ICU.  PCCM consulted.   SUBJECTIVE:   Opens eyes On less sedation   VITAL SIGNS: BP 128/73   Pulse 91   Temp 100.3 F (37.9 C) (Axillary)   Resp (!) 26   Ht 5\' 3"  (1.6 m)   Wt 66.1 kg (145 lb 11.6 oz)   SpO2 97%   BMI 25.81 kg/m   HEMODYNAMICS:    VENTILATOR SETTINGS: Vent Mode: PSV;CPAP FiO2 (%):  [30 %] 30 % Set Rate:  [14 bmp] 14  bmp Vt Set:  [440 mL] 440 mL PEEP:  [5 cmH20] 5 cmH20 Pressure Support:  [5 cmH20] 5 cmH20 Plateau Pressure:  [13 cmH20-18 cmH20] 16 cmH20  INTAKE / OUTPUT: I/O last 3 completed shifts: In: 5680 [I.V.:2700; NG/GT:1800; IV Piggyback:1180] Out: [id number] [Urine:4920; Drains:32; Stool:200]  PHYSICAL EXAMINATION: General:  Critically ill appearing female in NAD on vent Neuro:  Eyes closed, moves BLE with stimulation, does not follow commands, R sided weakness.  HEENT:  Oral mucosa dry, ETT  Cardiovascular:  RRR  Lungs:  resps even non labored on vent, Decreased BS in bases w/ no wheezing  Abdomen:  Soft, NT,  Hypoactive BS  Musculoskeletal:  No acute deformities Skin: scalp incision w/ crusting noted.   LABS:  BMET  Recent Labs Lab 09/07/16 0444 09/08/16 0500 09/09/16 0420  NA 149* 143 140  K 3.7 2.7* 3.6  CL 111 113* 110  CO2 27 23 23   BUN 14 16 10   CREATININE 0.57 0.46 0.38*  GLUCOSE 123* 162* 146*    Electrolytes  Recent Labs Lab 09/04/16 1635  09/10/2016 1000 09/07/16 0444 09/08/16 0500 09/09/16 0420  CALCIUM  --   < > 8.3* 8.4* 7.6* 7.5*  MG 2.0  --  1.9  --  1.8  --   PHOS 2.5  --  2.3*  --  2.0*  --   < > = values in this interval not displayed.  CBC  Recent Labs Lab 09/07/16 0444 09/08/16 0500 09/09/16 0420  WBC 12.0* 10.2 10.6*  HGB 12.8 9.3* 9.5*  HCT 39.6 27.8* 28.3*  PLT 175 173 167    Coag's  Recent Labs Lab 09/04/2016 1223  INR 1.02    Sepsis Markers  Recent Labs Lab 09/07/16 1417  LATICACIDVEN 1.1    ABG  Recent Labs Lab 09/04/16 0416 09/05/2016 1259  PHART 7.496* 7.398  PCO2ART 35.1 53.9*  PO2ART 114* 419.0*    Liver Enzymes No results for input(s): AST, ALT, ALKPHOS, BILITOT, ALBUMIN in the last 168 hours.  Cardiac Enzymes No results for input(s): TROPONINI, PROBNP in the last 168 hours.  Glucose  Recent Labs Lab 09/09/16 1524 09/09/16 1919 09/09/16 2307 09/10/16 0324 09/10/16 0751 09/10/16 1155  GLUCAP  134* 141* 152* 139* 153* 105*    Imaging Ct Head Wo Contrast  Result Date: 09/10/2016 CLINICAL DATA:  Patient pulled out ventriculostomy. Evaluate for change in ventricle size. EXAM: CT HEAD WITHOUT CONTRAST TECHNIQUE: Contiguous axial images were obtained from the base of the skull through the vertex without intravenous contrast. COMPARISON:  Three days ago FINDINGS: Brain: Interval increase in lateral ventricular volume after removal ventriculostomy. The temporal horns are most prominently dilated. Stable blood products in the left parietal cavity and left thalamic region. Edema in this area is stable to improved. Stable anterior left frontal low density, a small hematoma by MRI. Subarachnoid and ventricular debris that is subtle by CT. No evidence of progression. Decreasing pneumocephalus. No midline shift. No evidence of acute cortical infarct. Vascular: No acute finding Skull: Left parietal craniectomy site is stable Sinuses/Orbits: Bilateral cataract resection. IMPRESSION: 1. Mild increase in lateral ventricular volume after ventriculostomy removal. The temporal horns are mildly dilated; no generalized ventriculomegaly. 2. Elsewhere stable exam. Electronically Signed   By: Monte Fantasia M.D.   On: 09/10/2016 11:56     STUDIES:  CT head 11/17 >> Likely subarachnoid hemorrhage at the left convexity, and also at the right occipital lobe. Packing material at the left convexity,reflecting recent evacuation of a hematoma. Evolving subacute infarct at the left cerebral hemisphere, primarily involving the white matter. CT head 11/18 >> no changes  MRI 11/18 >> Subacute parenchymal hematoma in the left cerebral hemisphere, the largest is along the left frontal parietal junction has edema, local mass effect, and measures up to 7 cm.2. Subacute hemorrhage or less likely debris in infratentorial cisterns and lateral ventricles. Mild lateral ventricularhydrocephalus when compared to 2016 comparison.  Periventricular T2 hyperintensity is likely a combination of CSFre- absorption and the chronic microvascular disease that was seenin 2016.4. Superficial siderosis around the cerebral convexities, progressed from 2016  EEG 11/18 >> no seizure, global encephalopathic, mod to severe diffuse slowing.  EEG 11/21 >> no seizure, diffuse cerebral dysfunction MRI 11/23>>> 1. Diffuse leptomeningeal enhancement and incomplete FLAIR suppression of sulci over convexities, cerebellar folia, and along the surface of the brainstem, including regions without susceptibility signal abnormality, probably represents a combination of redistribution of subarachnoid hemorrhage and meningitis. This appears progressed on the FLAIR sequence. 2. Periventricular enhancement, increasing periventricular T2 FLAIR hyperintense signal abnormality, and debris within occipital horns of ventricles without typical evolution of blood signal is suspicious for ventriculitis and ventricular empyema. 3. Stable hemorrhages within the left paramedian anterior frontal and left posterior frontal parietal lobes. Enhancement and margin of hemorrhages is nonspecific can be seen as a reaction to hemorrhage or infection. 4. Stable mild enlargement of the lateral and third ventricles probably represents a component of hydrocephalus. 5. Several punctate foci of susceptibility hypointensity in the  supratentorial brain compatible with microhemorrhage are new from prior MR. 6. No acute infarct of the brain identified. No herniation or significant midline shift.  CULTURES: BC 08/31/16 >> serratia UC 11/17 >> E coli CSF culture 11/19 >> GNR >>neg BCx2 11/19 >>  Intercerebral abscess 11/23>>> serratia (RES cefazolin otherwise sens)  ANTIBIOTICS: Merrem 11/18 >>off Flagyl 11/19 >> 11/19, 11/25>>> linazolid 11/17 >>off ceftaz 11/25>>>  SIGNIFICANT EVENTS: 11/17 Transfer from Rehab to [facility] ER, then to [facility] Neuro ICU for 2 new SAH    LINES/TUBES: ETT 11/19 >>  DISCUSSION: 65 yo with recent [facility] s/p craniotomy and clot evacuation admitted with recurrent SAH .BC panel with + serration. [employer] abx added . LP is consistent with CNS infection.   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 and local brain flap infection s/p recent surgical procedure Hx Left Parietal Hemorrhage EEG no seizure  Abscess/infection of surgical site - s/p washout and removal bone flap 11/23 P:   Monitor mental status closely  Neuro/NS following  Avoid sedating rx > off sedatives since 11/24 AED's per neuro    PULMONARY A: Tachypnea ?secondary neuro / infectious etiology  Acute respiratory failure in setting AMS  P:   PRVC 8 cc/kg  Wean PEEP / FiO2 for sats > 92% PSV wean but mental status limits extubation at this time Intermittent CXR  Appreciate neurology conversations with family -- she would not want trach, long term SNF, etc. They may decide to withdraw care based on this information. Family is discussing   CARDIOVASCULAR A:  HTN - improved.  P:  Labetalol IV PRN Hold norvasc /cozaar for now  Tele monitoring   RENAL A:   Hypokalemia  Hypophosphatemia P:   Trend BMP / UOP  Replete electrolytes as indicated.   GASTROINTESTINAL A:   At Risk Malnutrition  P:   TF per nutrition PPI   HEMATOLOGIC A:   No active issues  P:  Trend CBC SCD's for DVT prophylaxis   INFECTIOUS A:   Bacteremia -Serratia on BC panel -  Brain abscess source  GNR in CSF P:   Trend Temp /WBC  Follow cultures Appreciate help from ID ID managing abx  See neuro   ENDOCRINE  Hypothyroid -tsh ok  Hyperglycemia  P:   Continue Synthroid  SSI    FAMILY  - Updates: no family available 11/26 - Inter-disciplinary family meet or Palliative Care meeting due by:  09/08/16   Independent CC time 35 minutes.    Baltazar Apo, MD, PhD 09/10/2016, 2:32 PM [place]  Pulmonary and Critical Care [phone] or if no answer [phone]

## 2016-09-10 NOTE — Progress Notes (Signed)
Transported Pt to CT on full vent support. Returned Pt to CPAP/PS  5/5 upon return to 3M12. Pt tolerated transport well.

## 2016-09-11 LAB — AEROBIC/ANAEROBIC CULTURE W GRAM STAIN (SURGICAL/DEEP WOUND)

## 2016-09-11 LAB — BASIC METABOLIC PANEL
Anion gap: 7 (ref 5–15)
BUN: 11 mg/dL (ref 6–20)
CALCIUM: 8.1 mg/dL — AB (ref 8.9–10.3)
CO2: 26 mmol/L (ref 22–32)
CREATININE: 0.41 mg/dL — AB (ref 0.44–1.00)
Chloride: 107 mmol/L (ref 101–111)
GFR calc non Af Amer: >60 mL/min (ref >60)
Glucose, Bld: 161 mg/dL — ABNORMAL HIGH (ref 65–99)
Potassium: 3.5 mmol/L (ref 3.5–5.1)
SODIUM: 140 mmol/L (ref 135–145)

## 2016-09-11 LAB — CBC
HCT: 26.6 % — ABNORMAL LOW (ref 36.0–46.0)
Hemoglobin: 8.9 g/dL — ABNORMAL LOW (ref 12.0–15.0)
MCH: 32 pg (ref 26.0–34.0)
MCHC: 33.5 g/dL (ref 30.0–36.0)
MCV: 95.7 fL (ref 78.0–100.0)
PLATELETS: 229 10*3/uL (ref 150–400)
RBC: 2.78 MIL/uL — AB (ref 3.87–5.11)
RDW: 13.7 % (ref 11.5–15.5)
WBC: 9 10*3/uL (ref 4.0–10.5)

## 2016-09-11 LAB — AEROBIC/ANAEROBIC CULTURE (SURGICAL/DEEP WOUND)

## 2016-09-11 LAB — ANAEROBIC CULTURE

## 2016-09-11 LAB — GLUCOSE, CAPILLARY
GLUCOSE-CAPILLARY: 147 mg/dL — AB (ref 65–99)
GLUCOSE-CAPILLARY: 142 mg/dL — AB (ref 65–99)
GLUCOSE-CAPILLARY: 122 mg/dL — AB (ref 65–99)
GLUCOSE-CAPILLARY: 136 mg/dL — AB (ref 65–99)
Glucose-Capillary: 130 mg/dL — ABNORMAL HIGH (ref 65–99)
Glucose-Capillary: 135 mg/dL — ABNORMAL HIGH (ref 65–99)

## 2016-09-11 NOTE — Care Management Note (Signed)
Case Management Note  Patient Details  Name: Sydney Coleman MRN: [id number] Date of Birth: [birth year]  Subjective/Objective: Pt admitted on 08/15/2016 with brain abscess, polymicrobial meningitis, and ventriculitis. PTA, pt resided at [facility].                      Action/Plan: Family leaning towards comfort measures for pt, as they do not want trach and PEG.  Will follow/provide emotional support as needed.    Expected Discharge Date:                  Expected Discharge Plan:  Skilled Nursing Facility  In-House Referral:  Clinical Social Work  Discharge planning Services     Post Acute Care Choice:    Choice offered to:     DME Arranged:    DME Agency:     HH Arranged:    [place] Agency:     Status of Service:  In process, will continue to follow  If discussed at Long Length of Stay Meetings, dates discussed:    Additional Comments:  Reinaldo Raddle, RN, BSN  Trauma/Neuro ICU Case Manager [phone]

## 2016-09-11 NOTE — Progress Notes (Signed)
STROKE TEAM PROGRESS NOTE   SUBJECTIVE (INTERVAL HISTORY) Family at bedside. No significant changes over night. Had another discussion with her husband. Husband and daughter seems to be leaning towards comfort care. Patient may have had a living will, husband will try to find it.    OBJECTIVE Temp:  [98.6 F (37 C)-100.9 F (38.3 C)] 100.9 F (38.3 C) (11/28 1200) Pulse Rate:  [71-109] 109 (11/28 1209) Cardiac Rhythm: Normal sinus rhythm (11/28 0800) Resp:  [14-31] 31 (11/28 1209) BP: (75-150)/(57-84) 138/71 (11/28 1209) SpO2:  [99 %-100 %] 100 % (11/28 1209) Arterial Line BP: (95-139)/(50-64) 117/59 (11/28 0500) FiO2 (%):  [30 %] 30 % (11/28 1209) Weight:  [142 lb 13.7 oz (64.8 kg)] 142 lb 13.7 oz (64.8 kg) (11/28 0500)  CBC:   Recent Labs Lab 09/09/16 0420 09/11/16 0510  WBC 10.6* 9.0  HGB 9.5* 8.9*  HCT 28.3* 26.6*  MCV 94.6 95.7  PLT 167 [id number]    Basic Metabolic Panel:   Recent Labs Lab 09/01/2016 1000  09/08/16 0500 09/09/16 0420 09/11/16 0510  NA 156*  < > 143 140 140  K 3.0*  < > 2.7* 3.6 3.5  CL 122*  < > 113* 110 107  CO2 26  < > 23 23 26   GLUCOSE 137*  < > 162* 146* 161*  BUN 21*  < > 16 10 11   CREATININE 0.46  < > 0.46 0.38* 0.41*  CALCIUM 8.3*  < > 7.6* 7.5* 8.1*  MG 1.9  --  1.8  --   --   PHOS 2.3*  --  2.0*  --   --   < > = values in this interval not displayed.  Lipid Panel:     Component Value Date/Time   CHOL 105 09/02/2016 0115   TRIG 117 09/05/2016 0426   HDL 64 09/02/2016 0115   CHOLHDL 1.6 09/02/2016 0115   VLDL 10 09/02/2016 0115   LDLCALC 31 09/02/2016 0115   HgbA1c:  Lab Results  Component Value Date   HGBA1C 5.7 (H) 09/02/2016   Urine Drug Screen:     Component Value Date/Time   LABOPIA POSITIVE (A) 08/16/2016 0958   COCAINSCRNUR NONE DETECTED 09/10/2016 0958   COCAINSCRNUR NONE DETECTED 05/11/2015 2037   LABBENZ NONE DETECTED 08/26/2016 0958   AMPHETMU NONE DETECTED 08/21/2016 0958   THCU NONE DETECTED 08/20/2016 0958    LABBARB NONE DETECTED 09/12/2016 0958      IMAGING Ct Head Wo Contrast  Result Date: 09/10/2016 CLINICAL DATA:  Patient pulled out ventriculostomy. Evaluate for change in ventricle size. EXAM: CT HEAD WITHOUT CONTRAST TECHNIQUE: Contiguous axial images were obtained from the base of the skull through the vertex without intravenous contrast. COMPARISON:  Three days ago FINDINGS: Brain: Interval increase in lateral ventricular volume after removal ventriculostomy. The temporal horns are most prominently dilated. Stable blood products in the left parietal cavity and left thalamic region. Edema in this area is stable to improved. Stable anterior left frontal low density, a small hematoma by MRI. Subarachnoid and ventricular debris that is subtle by CT. No evidence of progression. Decreasing pneumocephalus. No midline shift. No evidence of acute cortical infarct. Vascular: No acute finding Skull: Left parietal craniectomy site is stable Sinuses/Orbits: Bilateral cataract resection. IMPRESSION: 1. Mild increase in lateral ventricular volume after ventriculostomy removal. The temporal horns are mildly dilated; no generalized ventriculomegaly. 2. Elsewhere stable exam. Electronically Signed   By: Monte Fantasia M.D.   On: 09/10/2016 11:56  Microbiology results per pharmacy: 11/17 blood cx 2/2: serratia 11/18 urine cx: multiple 11/19 csf: GNR on GS, neg cx 11/19 VDRL - ip 11/19 HIV, HSV, Crypto Ag - neg 11/19 blood cx: NGTD 11/19 fungal smear: NGTD 11/18 MRSA - negative 11/17 Urine - Ecoli 11/19 VDRL - ip 11/23 abscess wound - serratia resistant to ancef 11/23 abscess fluid - serratia resistant to ancef 11/23 also growing candida glabrata 11/24 blood no growth x 2 days   PHYSICAL EXAM General - Well nourished, well developed, intubated, opens eyes when awakened.  Cardiovascular - Regular rate and rhythm. Neuro - intubated, eyes open, tracking to objects intermittently and blinking to  visual threat bilaterally, however, not following commands. Eyes in the middle position and doll's eye sluggish, PERRL. Positive corneal and gag. Has left gaze preference. RUE 0/5, RLE 2/5 on pain, LLE 3/5 on pain, and LUE intermittent spontaneous movement. DTR 1+ and no babinski. Sensation, coordination and gait not tested.    ASSESSMENT/PLAN Sydney Coleman is a 65 y.o. female with history of previous TIA, previous stroke, previous subarachnoid hemorrhage, hypertension, hypothyroidism, hyperlipidemia, COPD, chronic headaches, and anxiety presenting with nausea, vomiting, left gaze preference, altered mental status, and no longer speaking.  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  Surgical I&D - 08/25/2016 - POD #4 - NSG following -  Dr Arnoldo Morale   Ct Head good evacuation  Patient's mental status did not improve with ventriculostomy so would not recommend repeat ventriculostomy   ID on board  Abscess cultures - SERRATIA MARCESCENS - Cefazolin resistant and also candida Glabrata ID and pharmacy on board, appreciate recs.    Family seems to be leaning towards comfort measures for now. Cont abx for now. No neurosurgical intervention planned for now.  Polymicrobial meningitis / encephalitis / ventriculitis (no acute SAH this time)- hematogenous source from bacteremia vs. local process due to recent surgical procedure.   Resultant - fever, chill, leukocytosis, AMS  LP consistent with bacterial meningitis and also fungal  BCx positive for serratia marcescens - pan sensitive except cefazolin.  Wound culture also growing candida glabrata  CSF culture NGTD  CT head - concerning for small SAH x2 - left medial frontal and right inferior parietal, but most likely the proteinaceous secretions  MRI  - concerning for pus at above sites  Repeat MRI brain 09/05/16 - progressive  ventriculitis and meningitis with increasing brain abscesses  WBC trending down  febrile Tmax 100.9  ID on board  On Fortaz and Metronidazole now along with anidafungin  No need for TEE to rule out endocarditis as G neg rods do not generally cause endocarditis.   May consider d/cing metronidazole. For the fungal treatment, may need amphotericin if family decides to do aggressive care. However, family seems to be leaning towards comfort care for now.    Tremors  2-3 episodes of left UE shaking/tremor 09/03/16. EEG negative for seizures.   On keppra to 1000 mg bid  Consider d/cing keppra.   Hypernatremia - resolved.   Likely due to dehydration with fever, ventilation and limited fluid intake  - cont d51/2NS   Left pareital hemorrhage s/p craniotomy 4 weeks ago   Resultant  aphasia and dense right hemiplegia  CT head - encephalomacia at left hemisphere  UDS - positive for opiates  LDL - 31  HgbA1c -  5.7  VTE prophylaxis - subq heparin Diet NPO time specified -  tube feedings  aspirin 81 mg daily prior to admission, now on ASA 81mg  daily  Ongoing aggressive stroke risk factor management  Therapy recommendations: pending  Disposition: Pending  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 Krista Blue in clinic.  Unclear etiology at that time  Hypertension  Stable  At BP goal < 160  Hyperlipidemia  Home meds:  Pravachol 20 mg daily prior to admission  LDL 31  Resume statin on discharge  Tobacco abuse  Current smoker  Smoking cessation counseling will be provided  Other Stroke Risk Factors  Advanced age  Hx stroke/TIA  Family hx stroke (mother)  Other Active Problems  Hypokalemia -replete prn  Anemia - 8.9  Tube Feedings - 50 ml / hr (access for medications)   Dellia Nims, M.D. PGY-3  I have personally examined this patient, reviewed notes, independently viewed imaging studies, participated in  medical decision making and plan of care.ROS completed by me personally and pertinent positives fully documented  I have made any additions or clarifications directly to the above note. Agree with note above. D/w Dr Arnoldo Morale and Lamonte Sakai.Await family decision on comfort care. They do not want trach and PEG This patient is critically ill and at significant risk of neurological worsening, death and care requires constant monitoring of vital signs, hemodynamics,respiratory and cardiac monitoring, extensive review of multiple databases, frequent neurological assessment, discussion with family, other specialists and medical decision making of high complexity.I have made any additions or clarifications directly to the above note.This critical care time does not reflect procedure time, or teaching time or supervisory time of PA/NP/Med Resident etc but could involve care discussion time.  I spent 35 minutes of neurocritical care time  in the care of  this patient.      Antony Contras, MD Medical Director [facility] Pager: [phone] 09/11/2016 2:12 PM

## 2016-09-11 NOTE — Progress Notes (Signed)
PULMONARY / CRITICAL CARE MEDICINE   Name: Sydney Coleman MRN: [id number] DOB: 1951-06-21    ADMISSION DATE:  08/22/2016 CONSULTATION DATE:  09/10/2016  REFERRING MD:  Neuro /Dr. Cheral Marker   CHIEF COMPLAINT:  SAH   SUMMARY:   65 y/o F admitted to Neuro ICU on 08/30/2016 with new subarachnoid hemorrhages. 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 She was brought in to ER for gradually worsening mental status that started morning of 08/31/16 . She was at [facility] for rehabilitation and her family reported that her baseline mental status was alert and oriented times three, conversant and able to eat without difficulty. They reported that the patient started complaining of nausea and vomiting. Reportedly she was starting to act unusual and be less responsive at 8 AM on 11/17/17including some left gaze preference but not consistently so. Later in day on 08/31/16 she became nonverbal and acutely ill. She was transported by EMS for further evaluation.   She was taken to [facility] ER where CT head revealed two new subarachnoid hemorrhages, one involving sulci of the medial left frontal lobe and one involving sulci along the right occipital lobe. Also noted on OSH CT was recent craniotomy defect with encephalomalacia in the adjacent left parietofrontal region, consistent with evacuation of recent prior lobar hemorrhage. Chronic small vessel ischemic changes were also noted, evolving subacute infarct of left cerebral hemisphere . She was transferred to [facility] hospital to Neuro ICU.  PCCM consulted.   SUBJECTIVE:   Opens eyes On less sedation   VITAL SIGNS: BP 138/73   Pulse 91   Temp 99 F (37.2 C) (Axillary)   Resp (!) 23   Ht 5\' 3"  (1.6 m)   Wt 64.8 kg (142 lb 13.7 oz)   SpO2 100%   BMI 25.31 kg/m   HEMODYNAMICS:    VENTILATOR SETTINGS: Vent Mode: PSV;CPAP FiO2 (%):  [30 %] 30 % Set Rate:  [14 bmp] 14 bmp Vt  Set:  [440 mL] 440 mL PEEP:  [5 cmH20] 5 cmH20 Pressure Support:  [5 cmH20] 5 cmH20  INTAKE / OUTPUT: I/O last 3 completed shifts: In: 6275 [I.V.:2645; NG/GT:1840; IV Piggyback:1790] Out: QP:[id number]; Stool:500]  PHYSICAL EXAMINATION: General:  Critically ill appearing female in NAD on vent Neuro:  Eyes closed, moves BLE with stimulation, does not follow commands, R sided weakness.  HEENT:  Oral mucosa dry, ETT  Cardiovascular:  RRR  Lungs:  resps even non labored on vent, Decreased BS in bases w/ no wheezing  Abdomen:  Soft, NT,  Hypoactive BS  Musculoskeletal:  No acute deformities Skin: scalp incision w/ crusting noted.   LABS:  BMET  Recent Labs Lab 09/08/16 0500 09/09/16 0420 09/11/16 0510  NA 143 140 140  K 2.7* 3.6 3.5  CL 113* 110 107  CO2 23 23 26   BUN 16 10 11   CREATININE 0.46 0.38* 0.41*  GLUCOSE 162* 146* 161*    Electrolytes  Recent Labs Lab 09/04/16 1635  08/20/2016 1000  09/08/16 0500 09/09/16 0420 09/11/16 0510  CALCIUM  --   < > 8.3*  < > 7.6* 7.5* 8.1*  MG 2.0  --  1.9  --  1.8  --   --   PHOS 2.5  --  2.3*  --  2.0*  --   --   < > = values in this interval not displayed.  CBC  Recent Labs Lab 09/08/16 0500 09/09/16 0420 09/11/16 0510  WBC 10.2 10.6* 9.0  HGB 9.3* 9.5* 8.9*  HCT 27.8* 28.3* 26.6*  PLT 173 167 229    Coag's  Recent Labs Lab 09/03/2016 1223  INR 1.02    Sepsis Markers  Recent Labs Lab 09/07/16 1417  LATICACIDVEN 1.1    ABG  Recent Labs Lab 09/02/2016 1259  PHART 7.398  PCO2ART 53.9*  PO2ART 419.0*    Liver Enzymes No results for input(s): AST, ALT, ALKPHOS, BILITOT, ALBUMIN in the last 168 hours.  Cardiac Enzymes No results for input(s): TROPONINI, PROBNP in the last 168 hours.  Glucose  Recent Labs Lab 09/10/16 1155 09/10/16 1515 09/10/16 1942 09/10/16 2314 09/11/16 0338 09/11/16 0839  GLUCAP 105* 151* 151* 138* 147* 142*    Imaging Ct Head Wo Contrast  Result Date:  09/10/2016 CLINICAL DATA:  Patient pulled out ventriculostomy. Evaluate for change in ventricle size. EXAM: CT HEAD WITHOUT CONTRAST TECHNIQUE: Contiguous axial images were obtained from the base of the skull through the vertex without intravenous contrast. COMPARISON:  Three days ago FINDINGS: Brain: Interval increase in lateral ventricular volume after removal ventriculostomy. The temporal horns are most prominently dilated. Stable blood products in the left parietal cavity and left thalamic region. Edema in this area is stable to improved. Stable anterior left frontal low density, a small hematoma by MRI. Subarachnoid and ventricular debris that is subtle by CT. No evidence of progression. Decreasing pneumocephalus. No midline shift. No evidence of acute cortical infarct. Vascular: No acute finding Skull: Left parietal craniectomy site is stable Sinuses/Orbits: Bilateral cataract resection. IMPRESSION: 1. Mild increase in lateral ventricular volume after ventriculostomy removal. The temporal horns are mildly dilated; no generalized ventriculomegaly. 2. Elsewhere stable exam. Electronically Signed   By: Monte Fantasia M.D.   On: 09/10/2016 11:56     STUDIES:  CT head 11/17 >> Likely subarachnoid hemorrhage at the left convexity, and also at the right occipital lobe. Packing material at the left convexity,reflecting recent evacuation of a hematoma. Evolving subacute infarct at the left cerebral hemisphere, primarily involving the white matter. CT head 11/18 >> no changes  MRI 11/18 >> Subacute parenchymal hematoma in the left cerebral hemisphere, the largest is along the left frontal parietal junction has edema, local mass effect, and measures up to 7 cm.2. Subacute hemorrhage or less likely debris in infratentorial cisterns and lateral ventricles. Mild lateral ventricularhydrocephalus when compared to 2016 comparison. Periventricular T2 hyperintensity is likely a combination of CSFre- absorption and the  chronic microvascular disease that was seenin 2016.4. Superficial siderosis around the cerebral convexities, progressed from 2016  EEG 11/18 >> no seizure, global encephalopathic, mod to severe diffuse slowing.  EEG 11/21 >> no seizure, diffuse cerebral dysfunction MRI 11/23>>> 1. Diffuse leptomeningeal enhancement and incomplete FLAIR suppression of sulci over convexities, cerebellar folia, and along the surface of the brainstem, including regions without susceptibility signal abnormality, probably represents a combination of redistribution of subarachnoid hemorrhage and meningitis. This appears progressed on the FLAIR sequence. 2. Periventricular enhancement, increasing periventricular T2 FLAIR hyperintense signal abnormality, and debris within occipital horns of ventricles without typical evolution of blood signal is suspicious for ventriculitis and ventricular empyema. 3. Stable hemorrhages within the left paramedian anterior frontal and left posterior frontal parietal lobes. Enhancement and margin of hemorrhages is nonspecific can be seen as a reaction to hemorrhage or infection. 4. Stable mild enlargement of the lateral and third ventricles probably represents a component of hydrocephalus. 5. Several punctate foci of susceptibility hypointensity in the supratentorial brain compatible with microhemorrhage  are new from prior MR. 6. No acute infarct of the brain identified. No herniation or significant midline shift.  CULTURES: BC 08/31/16 >> serratia UC 11/17 >> E coli CSF culture 11/19 >> GNR >>neg BCx2 11/19 >>  Intercerebral abscess 11/23>>> serratia (RES cefazolin otherwise sens)  ANTIBIOTICS: Merrem 11/18 >>off Flagyl 11/19 >> 11/19, 11/25>>> linazolid 11/17 >>off ceftaz 11/25>>>  SIGNIFICANT EVENTS: 11/17 Transfer from Rehab to [facility] ER, then to [facility] Neuro ICU for 2 new SAH   LINES/TUBES: ETT 11/19 >>  DISCUSSION: 65 yo with recent [facility] s/p craniotomy and clot  evacuation admitted with recurrent SAH .BC panel with + serration. [employer] abx added . LP consistent with CNS infection.   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 and local brain flap infection s/p recent surgical procedure Hx Left Parietal Hemorrhage EEG no seizure  Abscess/infection of surgical site - s/p washout and removal bone flap 11/23 P:   Monitor mental status closely  Neuro/NS following  Avoiding sedating meds, off sedatives since 11/24 AED's per neuro    PULMONARY A: Tachypnea ?secondary neuro / infectious etiology  Acute respiratory failure in setting AMS  P:   PRVC 8 cc/kg  Wean PEEP / FiO2 for sats > 92% PSV wean but mental status limits extubation at this time Intermittent CXR  Appreciate neurology conversations with family -- she would not want trach, long term SNF, etc. They may decide to withdraw care based on this information. Family is discussing, will decide this week. I strongly advocate transition to comfort care.   CARDIOVASCULAR A:  HTN - improved.  P:  Labetalol IV PRN Hold norvasc /cozaar for now  Tele monitoring   RENAL A:   Hypokalemia  Hypophosphatemia P:   Trend BMP / UOP  Replete electrolytes as indicated.   GASTROINTESTINAL A:   At Risk Malnutrition  P:   TF per nutrition PPI   HEMATOLOGIC A:   No active issues  P:  Trend CBC SCD's for DVT prophylaxis   INFECTIOUS A:   Bacteremia -Serratia on BC panel -  Brain abscess source  GNR in CSF P:   Trend Temp /WBC  Follow cultures Appreciate help from ID ID managing abx  See neuro   ENDOCRINE  Hypothyroid -tsh ok  Hyperglycemia  P:   Continue Synthroid  SSI    FAMILY  - Updates: no family available 11/26 - Inter-disciplinary family meet or Palliative Care meeting due by:  09/08/16   Independent CC time 35 minutes.    Baltazar Apo, MD, PhD 09/11/2016, 11:27 AM [facility] [phone] or if no answer [phone]

## 2016-09-12 DIAGNOSIS — Z789 Other specified health status: Secondary | ICD-10-CM

## 2016-09-12 DIAGNOSIS — Z4659 Encounter for fitting and adjustment of other gastrointestinal appliance and device: Secondary | ICD-10-CM

## 2016-09-12 DIAGNOSIS — B49 Unspecified mycosis: Secondary | ICD-10-CM

## 2016-09-12 LAB — CULTURE, BLOOD (ROUTINE X 2)
CULTURE: NO GROWTH
Culture: NO GROWTH

## 2016-09-12 LAB — GLUCOSE, CAPILLARY
GLUCOSE-CAPILLARY: 132 mg/dL — AB (ref 65–99)
Glucose-Capillary: 128 mg/dL — ABNORMAL HIGH (ref 65–99)
Glucose-Capillary: 105 mg/dL — ABNORMAL HIGH (ref 65–99)

## 2016-09-12 LAB — VDRL, CSF: SYPHILIS VDRL QUANT CSF: NONREACTIVE

## 2016-09-12 MED ORDER — SODIUM CHLORIDE 0.9 % IV SOLN
10.0000 mg/h | INTRAVENOUS | Status: DC
  Administered 2016-09-12: 2 mg/h via INTRAVENOUS
  Administered 2016-09-14: 6 mg/h via INTRAVENOUS
  Filled 2016-09-12: qty 5

## 2016-09-12 MED ORDER — MORPHINE BOLUS VIA INFUSION
5.0000 mg | INTRAVENOUS | Status: DC | PRN
  Administered 2016-09-14 (×3): 5 mg via INTRAVENOUS
  Filled 2016-09-12: qty 20

## 2016-09-12 MED ORDER — SODIUM CHLORIDE 0.9 % IV SOLN
10.0000 mg/h | INTRAVENOUS | Status: DC
  Filled 2016-09-12: qty 10

## 2016-09-12 NOTE — Progress Notes (Signed)
[facility] Progress Note Patient Name: Sydney Coleman DOB: 1950/11/21 MRN: [id number]   Date of Service  09/12/2016  HPI/Events of Note  Pt now comfort measures, RN requested transfer orders to medical floor  eICU Interventions  Orders placed.      Intervention Category Minor Interventions: Routine modifications to care plan (e.g. PRN medications for pain, fever)  Laverle Hobby 09/12/2016, 3:34 PM

## 2016-09-12 NOTE — Progress Notes (Signed)
Visited and prayed again w/ pt's husband as medical staff began preparing to transition to comfort care later this afternoon after pt's daughter is comfortable with that process. Husband says, through tears, he knows she'll be in a better place, but this is so hard. Chaplain available for follow-up.    09/12/16 1300  Clinical Encounter Type  Visited With Patient and family together  Visit Type Psychological support;Spiritual support;Other (Comment) (end of life)  Referral From Chaplain  Spiritual Encounters  Spiritual Needs Brochure;Emotional;Grief support  Stress Factors  Family Stress Factors Family relationships;Loss;Loss of control   Gerrit Heck, Chaplain

## 2016-09-12 NOTE — Plan of Care (Signed)
Problem: Nutrition: Goal: Adequate nutrition will be maintained Outcome: Completed/Met Date Met: 09/12/16 Tube feeds

## 2016-09-12 NOTE — Progress Notes (Signed)
Visited with husband of pt as doctor gave news that daughter was on way, and explained how comfort care could proceed: that medication could start now and when husband and daughter had a last visit, tubes could be removed and  pt would breathe room air for an unknown amount of time. Provided spiritual/emotional support and prayer w/ husband and medical providers. Husband said their pastor is planning  to come by this afternoon. Chaplain available for follow-up.   Gerrit Heck, Chaplain

## 2016-09-12 NOTE — Progress Notes (Signed)
Responded to consult for end of life. Prayed for pt alone in rm, didn't find husband in waiting rm, spoke w/ medical providers, nurse said husband accepting of situation and knows what wife would have wanted, doctor about to call daughter re: transition to comfort care. Chaplain available for follow-up.   09/12/16 1000  Clinical Encounter Type  Visited With Patient  Visit Type Initial  Referral From Physician  Spiritual Encounters  Spiritual Needs Other (Comment) (end of life)  Stress Factors  Patient Stress Factors Other (Comment)  Family Stress Factors Loss (end of life)   Gerrit Heck, Chaplain

## 2016-09-12 NOTE — Progress Notes (Addendum)
PULMONARY / CRITICAL CARE MEDICINE   Name: Sydney Coleman MRN: [id number] DOB: 1950/12/16    ADMISSION DATE:  09/04/2016 CONSULTATION DATE:  08/20/2016  REFERRING MD:  Neuro /Dr. Cheral Marker   CHIEF COMPLAINT:  SAH   SUMMARY:   65 y/o F admitted to Neuro ICU on 08/31/2016 with new subarachnoid hemorrhages. 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 She was brought in to ER for gradually worsening mental status that started morning of 08/31/16 . She was at [facility] for rehabilitation and her family reported that her baseline mental status was alert and oriented times three, conversant and able to eat without difficulty. They reported that the patient started complaining of nausea and vomiting. Reportedly she was starting to act unusual and be less responsive at 8 AM on 11/17/17including some left gaze preference but not consistently so. Later in day on 08/31/16 she became nonverbal and acutely ill. She was transported by EMS for further evaluation.   She was taken to [facility] ER where CT head revealed two new subarachnoid hemorrhages, one involving sulci of the medial left frontal lobe and one involving sulci along the right occipital lobe. Also noted on OSH CT was recent craniotomy defect with encephalomalacia in the adjacent left parietofrontal region, consistent with evacuation of recent prior lobar hemorrhage. Chronic small vessel ischemic changes were also noted, evolving subacute infarct of left cerebral hemisphere . She was transferred to [facility] hospital to Neuro ICU.  PCCM consulted.   SUBJECTIVE:   Calm on vent No changes in neurostatus  VITAL SIGNS: BP (!) 146/70 (BP Location: Right Leg)   Pulse 72   Temp 97.9 F (36.6 C) (Axillary)   Resp (!) 22   Ht 5\' 3"  (1.6 m)   Wt 64.8 kg (142 lb 13.7 oz)   SpO2 100%   BMI 25.31 kg/m   HEMODYNAMICS:    VENTILATOR SETTINGS: Vent Mode: CPAP FiO2 (%):  [30 %]  30 % Set Rate:  [14 bmp-16 bmp] 14 bmp Vt Set:  [440 mL] 440 mL PEEP:  [5 cmH20] 5 cmH20 Pressure Support:  [5 cmH20] 5 cmH20 Plateau Pressure:  [14 cmH20] 14 cmH20  INTAKE / OUTPUT: I/O last 3 completed shifts: In: [id number] [I.V.:2710; NG/GT:1500; IV Piggyback:1380] Out: [id number] [Urine:5300; Stool:975]  PHYSICAL EXAMINATION: General:  Critically ill appearing female in NAD on vent Neuro:  Eyes closed, moves BLE to pain, does not follow commands, R sided weakness HEENT:  Oral mucosa dry, ETT  Cardiovascular:  RRR s1 s2  Lungs: reduced Abdomen:  Soft, NT, BS  Musculoskeletal:  No acute deformities Skin: scalp incision w/ crusting noted.   LABS:  BMET  Recent Labs Lab 09/08/16 0500 09/09/16 0420 09/11/16 0510  NA 143 140 140  K 2.7* 3.6 3.5  CL 113* 110 107  CO2 23 23 26   BUN 16 10 11   CREATININE 0.46 0.38* 0.41*  GLUCOSE 162* 146* 161*    Electrolytes  Recent Labs Lab 09/03/2016 1000  09/08/16 0500 09/09/16 0420 09/11/16 0510  CALCIUM 8.3*  < > 7.6* 7.5* 8.1*  MG 1.9  --  1.8  --   --   PHOS 2.3*  --  2.0*  --   --   < > = values in this interval not displayed.  CBC  Recent Labs Lab 09/08/16 0500 09/09/16 0420 09/11/16 0510  WBC 10.2 10.6* 9.0  HGB 9.3* 9.5* 8.9*  HCT 27.8* 28.3* 26.6*  PLT 173  [place] [facility] 09/13/2016 1223  INR 1.02    Sepsis Markers  Recent Labs Lab 09/07/16 1417  LATICACIDVEN 1.1    ABG  Recent Labs Lab 08/20/2016 1259  PHART 7.398  PCO2ART 53.9*  PO2ART 419.0*    Liver Enzymes No results for input(s): AST, ALT, ALKPHOS, BILITOT, ALBUMIN in the last 168 hours.  Cardiac Enzymes No results for input(s): TROPONINI, PROBNP in the last 168 hours.  Glucose  Recent Labs Lab 09/11/16 1138 09/11/16 1534 09/11/16 1930 09/11/16 2311 09/12/16 0351 09/12/16 0745  GLUCAP 130* 135* 122* 136* 128* 132*    Imaging No results found.   STUDIES:  CT head 11/17 >> Likely subarachnoid hemorrhage  at the left convexity, and also at the right occipital lobe. Packing material at the left convexity,reflecting recent evacuation of a hematoma. Evolving subacute infarct at the left cerebral hemisphere, primarily involving the white matter. CT head 11/18 >> no changes  MRI 11/18 >> Subacute parenchymal hematoma in the left cerebral hemisphere, the largest is along the left frontal parietal junction has edema, local mass effect, and measures up to 7 cm.2. Subacute hemorrhage or less likely debris in infratentorial cisterns and lateral ventricles. Mild lateral ventricularhydrocephalus when compared to 2016 comparison. Periventricular T2 hyperintensity is likely a combination of CSFre- absorption and the chronic microvascular disease that was seenin 2016.4. Superficial siderosis around the cerebral convexities, progressed from 2016  EEG 11/18 >> no seizure, global encephalopathic, mod to severe diffuse slowing.  EEG 11/21 >> no seizure, diffuse cerebral dysfunction MRI 11/23>>> 1. Diffuse leptomeningeal enhancement and incomplete FLAIR suppression of sulci over convexities, cerebellar folia, and along the surface of the brainstem, including regions without susceptibility signal abnormality, probably represents a combination of redistribution of subarachnoid hemorrhage and meningitis. This appears progressed on the FLAIR sequence. 2. Periventricular enhancement, increasing periventricular T2 FLAIR hyperintense signal abnormality, and debris within occipital horns of ventricles without typical evolution of blood signal is suspicious for ventriculitis and ventricular empyema. 3. Stable hemorrhages within the left paramedian anterior frontal and left posterior frontal parietal lobes. Enhancement and margin of hemorrhages is nonspecific can be seen as a reaction to hemorrhage or infection. 4. Stable mild enlargement of the lateral and third ventricles probably represents a component of hydrocephalus. 5.  Several punctate foci of susceptibility hypointensity in the supratentorial brain compatible with microhemorrhage are new from prior MR. 6. No acute infarct of the brain identified. No herniation or significant midline shift.  CULTURES: BC 08/31/16 >> serratia UC 11/17 >> E coli CSF culture 11/19 >> GNR >>neg BCx2 11/19 >>  Intercerebral abscess 11/23>>> serratia (RES cefazolin otherwise sens)  ANTIBIOTICS: Merrem 11/18 >>off Flagyl 11/19 >> 11/19, 11/25>>> linazolid 11/17 >>off ceftaz 11/25>>> eraxis 11/24>>>  SIGNIFICANT EVENTS: 11/17 Transfer from Rehab to [facility] ER, then to [facility] Neuro ICU for 2 new SAH   LINES/TUBES: ETT 11/19 >>  DISCUSSION: 65 yo with recent [facility] s/p craniotomy and clot evacuation admitted with recurrent SAH .BC panel with + serration. [employer] abx added . LP consistent with CNS infection.   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 and local brain flap infection s/p recent surgical procedure Hx Left Parietal Hemorrhage EEG no seizure  Abscess/infection of surgical site - s/p washout and removal bone flap 11/23 P:   Monitor mental status closely  Neuro/NS following  Avoiding sedating meds, off sedatives since 11/24 AED's per neuro Is there  a role repeat LP and re observe neurostatus Appears to have poor outcome  PULMONARY A: Tachypnea ?secondary neuro / infectious etiology  Acute respiratory failure in setting AMS  P:   PS 5, cpap 5, goal 2 hours Will discuss comfort vs trach No extubation Appears to need comfort care  CARDIOVASCULAR A:  HTN - improved.  P:  Labetalol IV PRN Hold norvasc /cozaar for now  Tele monitoring   RENAL A:   Hypokalemia  Hypophosphatemia P:   Limit any free water No role repeat labs  GASTROINTESTINAL A:   At Risk Malnutrition  P:   TF per nutrition PPI   HEMATOLOGIC A:   No active issues  P:  Trend CBC SCD's for DVT prophylaxis    INFECTIOUS A:   Bacteremia -Serratia on BC panel -  Brain abscess source  GNR in CSF Candida also noted P:   Trend Temp /WBC  ceftaz eraxis  ENDOCRINE  Hypothyroid -tsh ok  Hyperglycemia  P:   Continue Synthroid  SSI    FAMILY  - Updates: I updated husband, DNR established, consider comfort care - Inter-disciplinary family meet or Palliative Care meeting due by:  09/08/16  Ccm time 35 min   Lavon Paganini. Titus Mould, [place] Pgr: [facility]   Further d/w husband. NO escalation. Full comfort asap , I will call daughter to discuss for comfort thur not frid.tart meds now. Stop all other meds.

## 2016-09-12 NOTE — Progress Notes (Signed)
STROKE TEAM PROGRESS NOTE   SUBJECTIVE (INTERVAL HISTORY) Family at bedside. No significant changes over night. We have discussed with her husband and he is planning to do terminal extubation on Friday. Husband is agreeable to make her DNR for now.    OBJECTIVE Temp:  [97.9 F (36.6 C)-99.2 F (37.3 C)] 98.6 F (37 C) (11/29 1121) Pulse Rate:  [68-90] 81 (11/29 1121) Cardiac Rhythm: Normal sinus rhythm (11/29 0800) Resp:  [13-23] 21 (11/29 1121) BP: (117-163)/(63-96) 136/71 (11/29 1121) SpO2:  [97 %-100 %] 97 % (11/29 1121) FiO2 (%):  [30 %] 30 % (11/29 1142) Weight:  [142 lb 13.7 oz (64.8 kg)] 142 lb 13.7 oz (64.8 kg) (11/29 0500)  CBC:   Recent Labs Lab 09/09/16 0420 09/11/16 0510  WBC 10.6* 9.0  HGB 9.5* 8.9*  HCT 28.3* 26.6*  MCV 94.6 95.7  PLT 167 [id number]    Basic Metabolic Panel:   Recent Labs Lab 08/28/2016 1000  09/08/16 0500 09/09/16 0420 09/11/16 0510  NA 156*  < > 143 140 140  K 3.0*  < > 2.7* 3.6 3.5  CL 122*  < > 113* 110 107  CO2 26  < > 23 23 26   GLUCOSE 137*  < > 162* 146* 161*  BUN 21*  < > 16 10 11   CREATININE 0.46  < > 0.46 0.38* 0.41*  CALCIUM 8.3*  < > 7.6* 7.5* 8.1*  MG 1.9  --  1.8  --   --   PHOS 2.3*  --  2.0*  --   --   < > = values in this interval not displayed.  Lipid Panel:     Component Value Date/Time   CHOL 105 09/02/2016 0115   TRIG 117 09/05/2016 0426   HDL 64 09/02/2016 0115   CHOLHDL 1.6 09/02/2016 0115   VLDL 10 09/02/2016 0115   LDLCALC 31 09/02/2016 0115   HgbA1c:  Lab Results  Component Value Date   HGBA1C 5.7 (H) 09/02/2016   Urine Drug Screen:     Component Value Date/Time   LABOPIA POSITIVE (A) 09/08/2016 0958   COCAINSCRNUR NONE DETECTED 08/16/2016 0958   COCAINSCRNUR NONE DETECTED 05/11/2015 2037   LABBENZ NONE DETECTED 09/08/2016 0958   AMPHETMU NONE DETECTED 09/07/2016 0958   THCU NONE DETECTED 09/12/2016 0958   LABBARB NONE DETECTED 09/08/2016 0958      IMAGING No results found.     Microbiology results per pharmacy: 11/17 blood cx 2/2: serratia 11/18 urine cx: multiple 11/19 csf: GNR on GS, neg cx 11/19 VDRL - ip 11/19 HIV, HSV, Crypto Ag - neg 11/19 blood cx: NGTD 11/19 fungal smear: NGTD 11/18 MRSA - negative 11/17 Urine - Ecoli 11/19 VDRL - ip 11/23 abscess wound - serratia resistant to ancef 11/23 abscess fluid - serratia resistant to ancef 11/23 also growing candida glabrata 11/24 blood no growth x 2 days   PHYSICAL EXAM General - Well nourished, well developed, intubated, opens eyes when awakened.  Cardiovascular - Regular rate and rhythm. Neuro - intubated, eyes open, tracking to objects intermittently and blinking to visual threat bilaterally, however, not following commands. Eyes in the middle position and doll's eye sluggish, PERRL. Positive corneal and gag. Has left gaze preference. RUE 0/5, RLE 2/5 on pain, LLE 3/5 on pain, and LUE intermittent spontaneous movement. DTR 1+ and no babinski. Sensation, coordination and gait not tested.    ASSESSMENT/PLAN Sydney Coleman is a 65 y.o. female with history of previous TIA, previous stroke, previous  subarachnoid hemorrhage, hypertension, hypothyroidism, hyperlipidemia, COPD, chronic headaches, and anxiety presenting with nausea, vomiting, left gaze preference, altered mental status, and no longer speaking.  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  Surgical I&D - 08/17/2016 - POD #4 - NSG following -  Dr Arnoldo Morale   Ct Head good evacuation  Patient's mental status did not improve with ventriculostomy so would not recommend repeat ventriculostomy   ID on board  Abscess cultures - SERRATIA MARCESCENS - Cefazolin resistant and also candida Glabrata ID and pharmacy on board, appreciate recs.    Family seems to be leaning towards comfort measures due to her lack of improvement for  last several days and her overall poor prognosis. Planning for terminal extubation on Friday. Cont abx for now. No neurosurgical intervention planned for now.  Polymicrobial meningitis / encephalitis / ventriculitis (no acute SAH this time)- hematogenous source from bacteremia vs. local process due to recent surgical procedure.   Resultant - fever, chill, leukocytosis, AMS  LP consistent with bacterial meningitis and also fungal  BCx positive for serratia marcescens - pan sensitive except cefazolin.  Wound culture also growing candida glabrata  CSF culture NGTD  CT head - concerning for small SAH x2 - left medial frontal and right inferior parietal, but most likely the proteinaceous secretions  MRI  - concerning for pus at above sites  Repeat MRI brain 09/05/16 - progressive ventriculitis and meningitis with increasing brain abscesses  WBC trending down  febrile Tmax 100.9  ID on board  On Fortaz and Metronidazole now along with anidafungin  No need for TEE to rule out endocarditis as G neg rods do not generally cause endocarditis.   May consider d/cing metronidazole. For the fungal treatment, may need amphotericin if family decides to do aggressive care. However, family seems to be leaning towards comfort care for now.    Tremors  2-3 episodes of left UE shaking/tremor 09/03/16. EEG negative for seizures.   On keppra to 1000 mg bid  Consider d/cing keppra.   Hypernatremia - resolved.   Likely due to dehydration with fever, ventilation and limited fluid intake  - cont d51/2NS   Left pareital hemorrhage s/p craniotomy 4 weeks ago   Resultant  aphasia and dense right hemiplegia  CT head - encephalomacia at left hemisphere  UDS - positive for opiates  LDL - 31  HgbA1c -  5.7  VTE prophylaxis - subq heparin Diet NPO time specified - tube feedings  aspirin 81 mg daily prior to admission, now on ASA 81mg  daily  Ongoing aggressive stroke risk factor  management  Therapy recommendations: pending  Disposition: Pending  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 Krista Blue in clinic.  Unclear etiology at that time  Hypertension  Stable  At BP goal < 160  Hyperlipidemia  Home meds:  Pravachol 20 mg daily prior to admission  LDL 31  Resume statin on discharge  Tobacco abuse  Current smoker  Smoking cessation counseling will be provided  Other Stroke Risk Factors  Advanced age  Hx stroke/TIA  Family hx stroke (mother)  Other Active Problems  Hypokalemia -replete prn  Anemia   Tube Feedings - 50 ml / hr (access for medications)   Dellia Nims, M.D. PGY-3  I have personally examined this patient, reviewed notes, independently viewed imaging studies, participated in medical decision making and plan of care.ROS completed  by me personally and pertinent positives fully documented  I have made any additions or clarifications directly to the above note. Agree with note above. I spoke to the patient's husband at the bedside and again redirected her poor prognosis. He is agreeable to DO NOT RESUSCITATE and likely wants to withdraw support and make her comfort care on 10-02-2016. Discussed with Dr. Titus Mould This patient is critically ill and at significant risk of neurological worsening, death and care requires constant monitoring of vital signs, hemodynamics,respiratory and cardiac monitoring, extensive review of multiple databases, frequent neurological assessment, discussion with family, other specialists and medical decision making of high complexity.I have made any additions or clarifications directly to the above note.This critical care time does not reflect procedure time, or teaching time or supervisory time of PA/NP/Med Resident etc but could involve care discussion time.  I spent 30 minutes of neurocritical care time  in the care of  this patient.      Antony Contras, MD Medical  Director [facility] Pager: [phone] 09/12/2016 3:23 PM

## 2016-09-13 NOTE — Progress Notes (Signed)
STROKE TEAM PROGRESS NOTE   SUBJECTIVE (INTERVAL HISTORY) Daughter at bedside. Patient is on morphine drip. She seems to be resting peacefully. She has been transferred to the hospice floor bed OBJECTIVE Temp:  [98.8 F (37.1 C)] 98.8 F (37.1 C) (11/30 1345) Pulse Rate:  [71-87] 87 (11/30 1345) Resp:  [14-17] 16 (11/30 1345) BP: (127-173)/(70-76) 173/76 (11/30 1345) SpO2:  [92 %-96 %] 94 % (11/30 1345) Weight:  [62 lb 4.8 oz (28.3 kg)] 62 lb 4.8 oz (28.3 kg) (11/30 0451)  CBC:   Recent Labs Lab 09/09/16 0420 09/11/16 0510  WBC 10.6* 9.0  HGB 9.5* 8.9*  HCT 28.3* 26.6*  MCV 94.6 95.7  PLT 167 [id number]    Basic Metabolic Panel:   Recent Labs Lab 09/08/16 0500 09/09/16 0420 09/11/16 0510  NA 143 140 140  K 2.7* 3.6 3.5  CL 113* 110 107  CO2 23 23 26   GLUCOSE 162* 146* 161*  BUN 16 10 11   CREATININE 0.46 0.38* 0.41*  CALCIUM 7.6* 7.5* 8.1*  MG 1.8  --   --   PHOS 2.0*  --   --     Lipid Panel:     Component Value Date/Time   CHOL 105 09/02/2016 0115   TRIG 117 09/05/2016 0426   HDL 64 09/02/2016 0115   CHOLHDL 1.6 09/02/2016 0115   VLDL 10 09/02/2016 0115   LDLCALC 31 09/02/2016 0115   HgbA1c:  Lab Results  Component Value Date   HGBA1C 5.7 (H) 09/02/2016   Urine Drug Screen:     Component Value Date/Time   LABOPIA POSITIVE (A) 08/31/2016 0958   COCAINSCRNUR NONE DETECTED 09/13/2016 0958   COCAINSCRNUR NONE DETECTED 05/11/2015 2037   LABBENZ NONE DETECTED 08/27/2016 0958   AMPHETMU NONE DETECTED 08/31/2016 0958   THCU NONE DETECTED 08/22/2016 0958   LABBARB NONE DETECTED 09/05/2016 0958      IMAGING No results found.    Microbiology results per pharmacy: 11/17 blood cx 2/2: serratia 11/18 urine cx: multiple 11/19 csf: GNR on GS, neg cx 11/19 VDRL - ip 11/19 HIV, HSV, Crypto Ag - neg 11/19 blood cx: NGTD 11/19 fungal smear: NGTD 11/18 MRSA - negative 11/17 Urine - Ecoli 11/19 VDRL - ip 11/23 abscess wound - serratia resistant to  ancef 11/23 abscess fluid - serratia resistant to ancef 11/23 also growing candida glabrata 11/24 blood no growth x 2 days   PHYSICAL EXAM General - Well nourished, well developed, intubated, opens eyes when awakened.  Cardiovascular - Regular rate and rhythm. Neuro -extubated eyes open, tracking to objects intermittently and blinking to visual threat bilaterally, however, not following commands. Eyes in the middle position and doll's eye sluggish, PERRL. Positive corneal and gag. Has left gaze preference. RUE 0/5, RLE 2/5 on pain, LLE 3/5 on pain, and LUE intermittent spontaneous movement. DTR 1+ and no babinski. Sensation, coordination and gait not tested.    ASSESSMENT/PLAN Ms. RUKHSAR HARMES is a 65 y.o. female with history of previous TIA, previous stroke, previous subarachnoid hemorrhage, hypertension, hypothyroidism, hyperlipidemia, COPD, chronic headaches, and anxiety presenting with nausea, vomiting, left gaze preference, altered mental status, and no longer speaking.  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  Surgical I&D - 09/01/2016 - POD #4 - NSG following -  Dr Arnoldo Morale   Ct Head good evacuation  Patient's mental status did not improve with ventriculostomy so would not recommend repeat ventriculostomy  ID on board  Abscess cultures - SERRATIA MARCESCENS - Cefazolin resistant and also candida Glabrata ID and pharmacy on board, appreciate recs.    Family seems to be leaning towards comfort measures due to her lack of improvement for last several days and her overall poor prognosis. Planning for terminal extubation on Friday. Cont abx for now. No neurosurgical intervention planned for now.  Polymicrobial meningitis / encephalitis / ventriculitis (no acute SAH this time)- hematogenous source from bacteremia vs. local process due to recent surgical procedure.    Resultant - fever, chill, leukocytosis, AMS  LP consistent with bacterial meningitis and also fungal  BCx positive for serratia marcescens - pan sensitive except cefazolin.  Wound culture also growing candida glabrata  CSF culture NGTD  CT head - concerning for small SAH x2 - left medial frontal and right inferior parietal, but most likely the proteinaceous secretions  MRI  - concerning for pus at above sites  Repeat MRI brain 09/05/16 - progressive ventriculitis and meningitis with increasing brain abscesses  WBC trending down  febrile Tmax 100.9  ID on board  On Fortaz and Metronidazole now along with anidafungin  No need for TEE to rule out endocarditis as G neg rods do not generally cause endocarditis.    .    Tremors  2-3 episodes of left UE shaking/tremor 09/03/16. EEG negative for seizures.   On keppra to 1000 mg bid  Consider d/cing keppra.   Hypernatremia - resolved.   Likely due to dehydration with fever, ventilation and limited fluid intake  - cont d51/2NS   Left pareital hemorrhage s/p craniotomy 4 weeks ago   Resultant  aphasia and dense right hemiplegia  CT head - encephalomacia at left hemisphere  UDS - positive for opiates  LDL - 31  HgbA1c -  5.7  VTE prophylaxis - subq heparin Diet NPO time specified -   aspirin 81 mg daily prior to admission, now on ASA 81mg  daily  Ongoing aggressive stroke risk factor management  Therapy recommendations: n/a  Disposition:hospice nursing home  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 Krista Blue in clinic.  Unclear etiology at that time  Hypertension  Stable  At BP goal < 160  Hyperlipidemia  Home meds:  Pravachol 20 mg daily prior to admission  LDL 31  Resume statin on discharge  Tobacco abuse  Current smoker  Smoking cessation counseling will be provided  Other Stroke Risk Factors  Advanced age  Hx stroke/TIA  Family hx stroke  (mother)  Other Active Problems  Hypokalemia -replete prn  Anemia   Tube Feedings - 50 ml / hr (access for medications)      I have personally examined this patient, reviewed notes, independently viewed imaging studies, participated in medical decision making and plan of care.ROS completed by me personally and pertinent positives fully documented  I have made any additions or clarifications directly to the above note.   I spoke to the patient's daughter at the bedside and answered questions. Family seems comfortable with decision on comfort care. They're agreeable to transferring to hospice nursing home or the next few days in bed available.       Antony Contras, MD Medical Director [facility] Pager: [phone] 09/13/2016 3:29 PM

## 2016-09-13 NOTE — Progress Notes (Signed)
CSW made referral to [facility] per daughter's request.  Cedric Fishman [facility] [phone]

## 2016-09-13 NOTE — Progress Notes (Signed)
Called SW fior referral to [facility].

## 2016-09-13 NOTE — Progress Notes (Signed)
Nutrition Brief Note  Chart reviewed. Pt now transitioning to comfort care.  No further nutrition interventions warranted at this time.  Please re-consult as needed.   Rhett Najera RD, LDN, CNSC [phone] Pager [phone] After Hours Pager    

## 2016-09-13 NOTE — Clinical Social Work Note (Signed)
Clinical Social Work Assessment  Patient Details  Name: Sydney Coleman MRN: [id number] Date of Birth: Feb 01, 1951  Date of referral:  09/13/16               Reason for consult:  End of Life/Hospice                Permission sought to share information with:  Facility Sport and exercise psychologist, Family Supports Permission granted to share information::  No (Disoriented)  Name::     Building surveyor::  Hospice  Relationship::  Daughter  Contact Information:  [phone]  Housing/Transportation Living arrangements for the past 2 months:  [place] of Information:  Adult Children Patient Interpreter Needed:  None Criminal Activity/Legal Involvement Pertinent to Current Situation/Hospitalization:  No - Comment as needed Significant Relationships:  Adult Children, Spouse Lives with:  Spouse Do you feel safe going back to the place where you live?  No Need for family participation in patient care:  Yes (Comment)  Care giving concerns:  CSW received consult regarding Hospice placement. Patient's family would like her to be placed at [facility]. CSW to follow for discharge needs.    Social Worker assessment / plan:  Residential Hospice Referral made.   Employment status:  Retired Nurse, adult PT Recommendations:  Not assessed at this time Information / Referral to community resources:  Other (Comment Required) (Residential Hospice)  Patient/Family's Response to care:  Patient's family is sad at this turn of medical events, but they are accepting of plan.   Patient/Family's Understanding of and Emotional Response to Diagnosis, Current Treatment, and Prognosis:  Patient's daughter is realistic of plan and understands CSW role. No current questions or concerns.   Emotional Assessment Appearance:  Appears older than stated age Attitude/Demeanor/Rapport:  Unable to Assess Affect (typically observed):  Unable to Assess Orientation:    (Disoriented) Alcohol / Substance use:  Not Applicable Psych involvement (Current and /or in the community):  No (Comment)  Discharge Needs  Concerns to be addressed:  Care Coordination Readmission within the last 30 days:  No Current discharge risk:  None Barriers to Discharge:  Continued Medical Work up   [employer], [place] 09/13/2016, 2:27 PM

## 2016-09-14 MED ORDER — GLYCOPYRROLATE 0.2 MG/ML IJ SOLN
0.1000 mg | Freq: Once | INTRAMUSCULAR | Status: AC
  Administered 2016-09-14: 0.1 mg via INTRAVENOUS
  Filled 2016-09-14: qty 1

## 2016-09-14 DEATH — deceased

## 2016-09-19 ENCOUNTER — Ambulatory Visit: Payer: Medicare Other | Admitting: Neurology

## 2016-09-23 LAB — CULTURE, FUNGUS WITHOUT SMEAR

## 2016-10-15 NOTE — Discharge Summary (Signed)
Patient ID: Sydney Coleman MRN: [id number] DOB/AGE: 1951-07-04 66 y.o.  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  Pertinent Medical Diagnosis: Active Problems:   Subarachnoid bleed ([facility])   Superficial siderosis of central nervous system   Bacteremia   Brain abscess   Acute respiratory failure ([facility])   Bacterial meningitis   Endotracheally intubated   History of intracranial hemorrhage   History of craniotomy   Acute respiratory failure with hypoxia ([facility])   Serratia infection   Cerebral ventriculitis   Fungal infection   Encounter for feeding tube placement   Hospital Course:  Sydney Coleman is an 66 y.o. female who was transferred from [facility] for further management of new subarachnoid hemorrhages.   Per provider notes at [facility]: "Sydney Coleman a 66 y.o.femalewith a complicated medical history that most recently includes hemorrhagic stroke approximately 17 days ago while she was in [place] resulting in craniotomy and clot evacuation. She presents tonight by EMS for evaluation of gradually worsening mental status since early this morning. She is currently at [facility] for rehabilitation,, and her family reports that her current status is alert and oriented times three, conversant, and able to eat without difficulty. They report that yesterday the patient started complaining of nausea and vomiting. Reportedly she was starting to act unusual and be less responsive at 8 AM today including with some left gaze preference but not consistently so. By tonight she is nonverbal and appears acutely ill. She was transported by EMS for further evaluation. Her family is present at bedside and states that  this is very far from her post procedural baseline."  "Her last known normal was last night with only nausea and vomiting reported as symptoms yesterday. She is ill appearing upon arrival with tachycardia, tachypnea, altered mental status, but afebrile and normotensive (in fact slightly hypertensive).""Pt began to have nausea/vomiting yesterday. Pt's eyes began to look left at approx 8am this morning. [name] reports when she got to [facility] at 1800 today patient no longer verbal."  Family stated that symptoms began 08/31/16 in the afternoon with "not feeling well" progressing to nausea and vomiting. She was taken to [facility] where CT head revealed two new subarachnoid hemorrhages, one involving sulci of the medial left frontal lobe and one involving sulci along the right occipital lobe. Also noted on OSH CT was recent craniotomy defect with encephalomalacia in the adjacent left parietofrontal region, consistent with evacuation of recent prior lobar hemorrhage. Chronic small vessel ischemic changes were also noted. Since admission to [facility] the patient remained nonverbal and globally aphasic with examination findings being out of proportion to the small new abnormalities on the CT scan. EEG was obtained which showed severely left hemispheric slowing but no definite epileptiform activity. MRI scan of the brain was obtained on 09/02/16 which showed subacute left parietal hematoma and proteinaceous products in the cisterns and cyst which may have given the appearance of subarachnoid hemorrhage on CT scan but no active hemorrhage is noted. Postcontrast images showed enhancement of the cisterns and around the brain stem raising concern for ventriculitis and meningitis. A spinal tap was obtained which showed finding consistent with meningitis with 1900 white cells with neutrophils with 95% CSF glucose was less than 20 and protein was elevated at 978 mg percent. CSF  cultures subsequently grew Serratia.  Scalp wound culture grew Candida glabrata. Patient was seen on consultation by infectious disease and started on antibiotics. Despite 10 days of antibiotics patient showed no significant aortic improvement in fact a follow-up MRI scan on 08/15/2016 showed diffuse leptomeningeal enhancement which appear to have been progressed compared to previous MRI scan with periventricular enhancement and debris in the occipital horns suspicious for ventriculitis and ventricular empyema. Patient prognosis is felt to be quite poor. He she was seen in consultation by neurosurgery and underwent craniotomy on 08/31/2016 by Dr. Mariann Barter with drainage of abscess and placement of ventricular catheter drain. The catheter drain was involuntary dislodged a few days later but without significant change in neurological exam. Follow-up CT scans also showed no significant change in patient's ventricular size. After prolonged ventilatory support and aggressive critical care support and antibiotics under the guidance of the infectious disease team patient's condition did not improve. It became clear that the patient would require prolonged ventilatory support and tracheostomy PEG tube as well as nursing home placement the patient's family did not want that. I had multiple discussions with the patient's husband and daughter and they were quite clear that patient would not want a life of disability and living in a nursing home with prolonged ventilatory and nutritional support. They hence made the patient DO NOT RESUSCITATE and comfort care and ventilatory support was withdrawn. Patient was placed on morphine drip for comfort and transferred to the hospice floor bed. Transfer in place to transfer her to hospice nursing home the patient's condition declined on 2016/09/23 a.m. and she was found to be pulseless and not breathing On 10:37 AM on 09/23/16 and she was pronounced dead by the RN. Family was notified.  Signed: Nicholad Kautzman 2016-09-23, 3:15  PM

## 2016-10-15 NOTE — Progress Notes (Addendum)
0730 Pt is unresponsive, abdominal breathing noted, diaphoretic. Morphine bolus given, emotional support given to the daughter. Dr Leonie Man notified of present status. [id number] Breathing is labored, Morphine drip titrated. Pt's spouse and daughter present. 1037 Pt DNR, unresponsive, no respiration, no pulse. [name] and spouse at the bedside.  Time of death 29, verified by me and another RN, Cassandria Anger. Raye Sorrow PA of Dr Leonie Man notified. [place] donor service notified. Post-mortem care done after the family leave.  43 Dr Leonie Man came in and signed Death certificate. Wasted Morphine sulfate 110 ml witnessed by Precious Bard, RN.  Signed by Precious Bard, RN

## 2016-10-15 NOTE — Care Management Important Message (Signed)
Important Message  Patient Details  Name: Sydney Coleman MRN: [id number] Date of Birth: June 27, 1951   Medicare Important Message Given:  Yes    Nathen May 09-22-16, 1:21 PM

## 2016-10-15 NOTE — Consult Note (Signed)
            [facility] CM Primary Care Navigator  10-14-16  Sydney Coleman October 21, 1950 [id number]  Went to see patient to identify possible discharge needs but RN reports that patient had expired today at 10:37 am.  Await for funeral home to pick-up.  For additional questions please contact:  Edwena Felty A. Brexlee Heberlein, BSN, RN-BC [facility] PRIMARY CARE Navigator Cell: [phone]

## 2016-10-15 NOTE — Progress Notes (Signed)
Pt's body to morgue at 1215.

## 2016-10-15 DEATH — deceased

## 2016-10-16 ENCOUNTER — Inpatient Hospital Stay: Payer: Medicare Other | Admitting: Internal Medicine

## 2018-03-20 IMAGING — US US THYROID
1 series · 14 of 25 positions shown · non-contrast
Comparison: None.

CLINICAL DATA: Hyperthyroidism. Right hemithyroidectomy. Left
thyroid nodule noted on recent CTA neck.

EXAM:
THYROID ULTRASOUND
TECHNIQUE: Ultrasound examination of the thyroid gland and adjacent soft
tissues was performed.

[Series 1: us thyroid · 0.07mm/px · 14 of 37 slices shown]
[im 1/37]
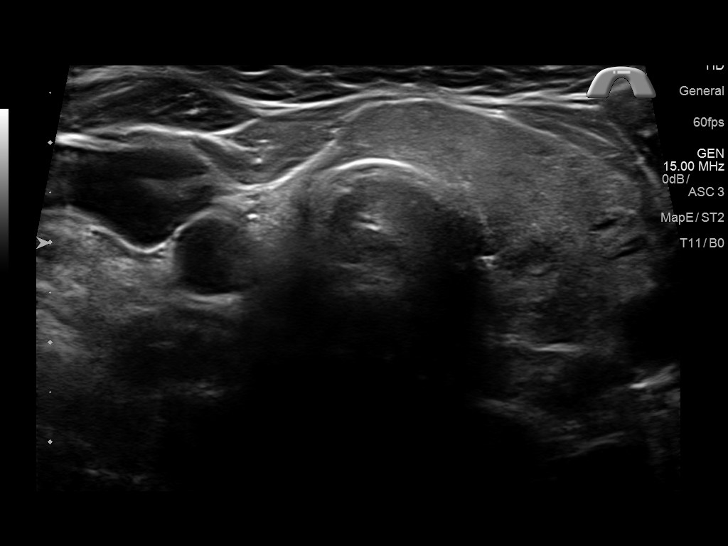
[im 4/37]
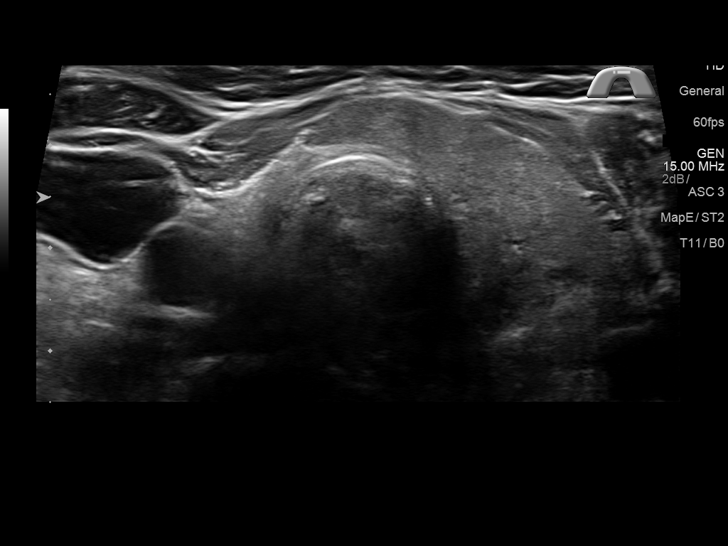
[im 7/37]
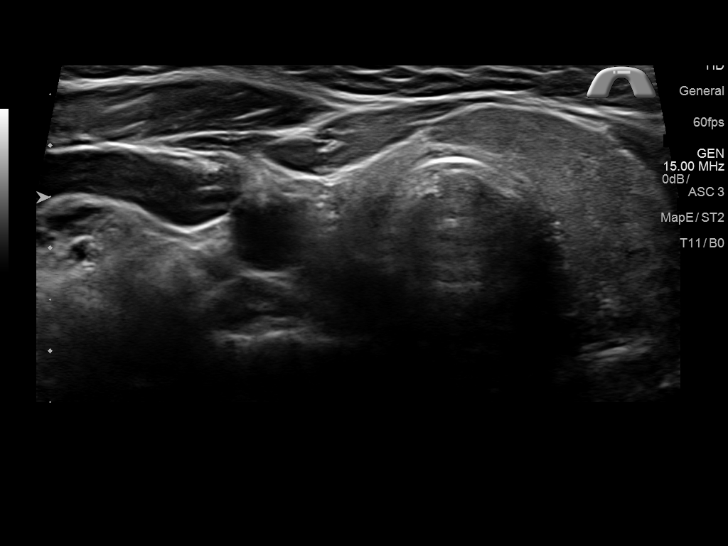
[im 10/37]
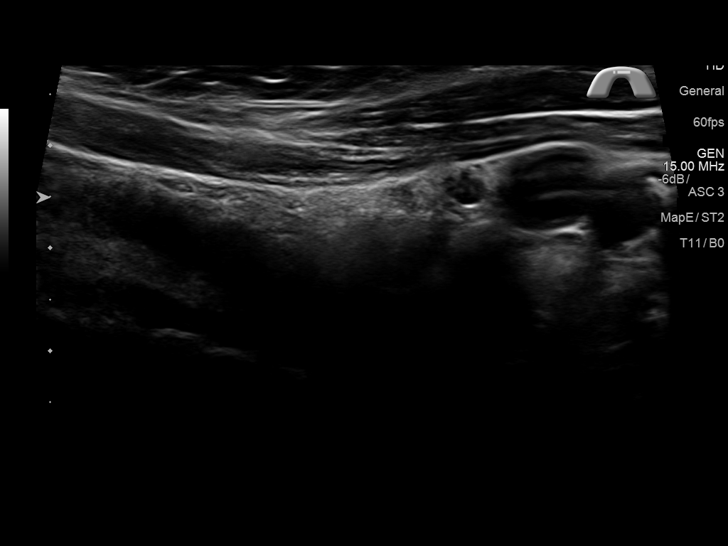
[im 13/37]
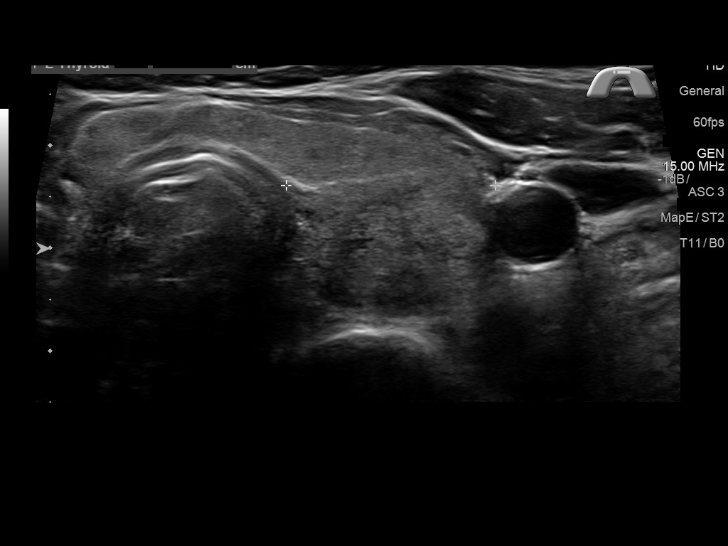
[im 14/37]
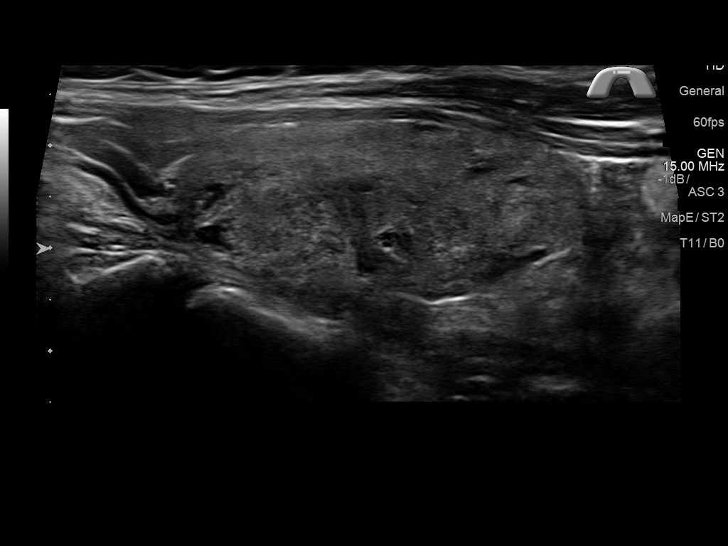
[im 17/37]
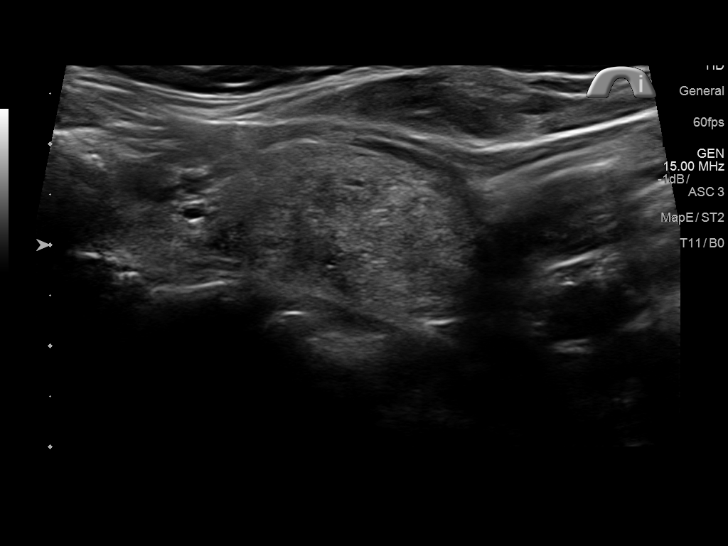
[im 20/37]
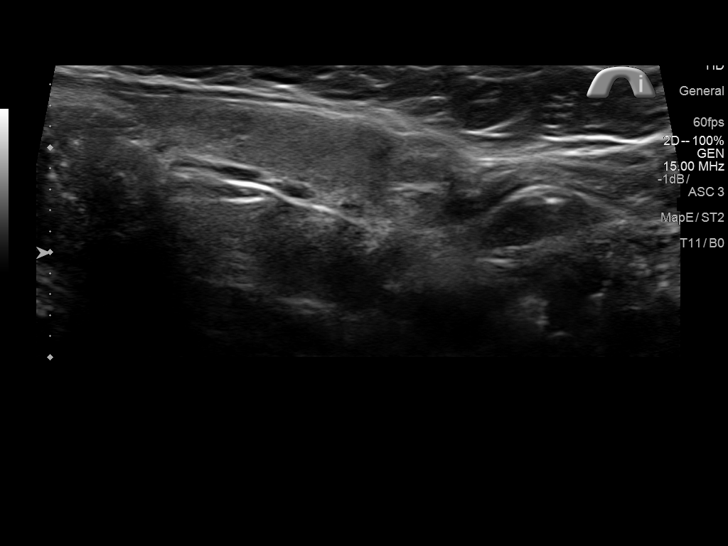
[im 23/37]
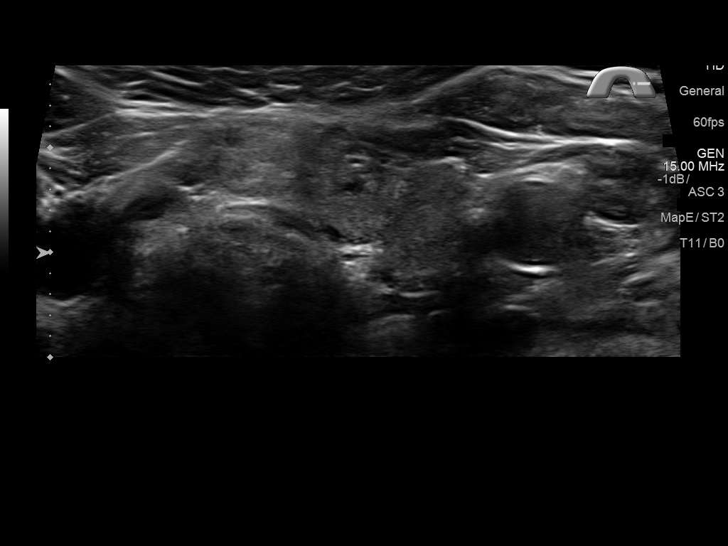
[im 25/37]
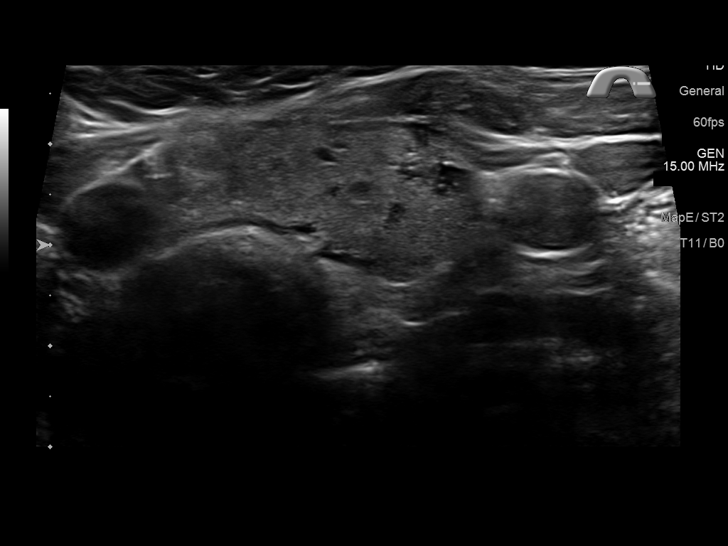
[im 28/37]
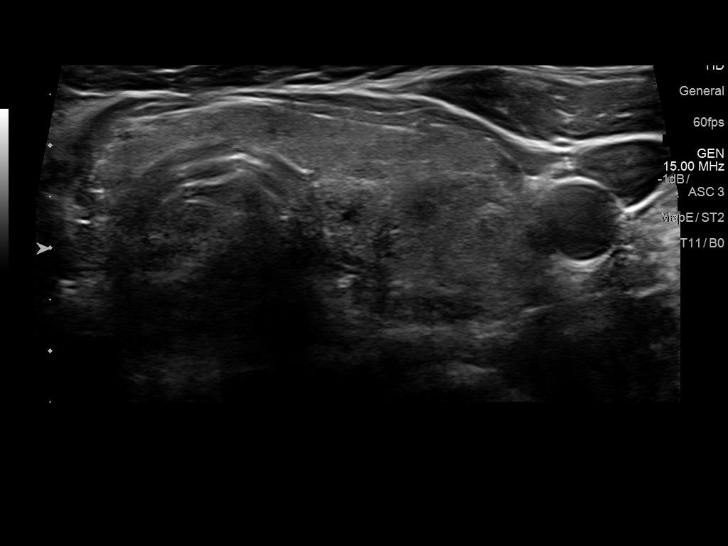
[im 31/37]
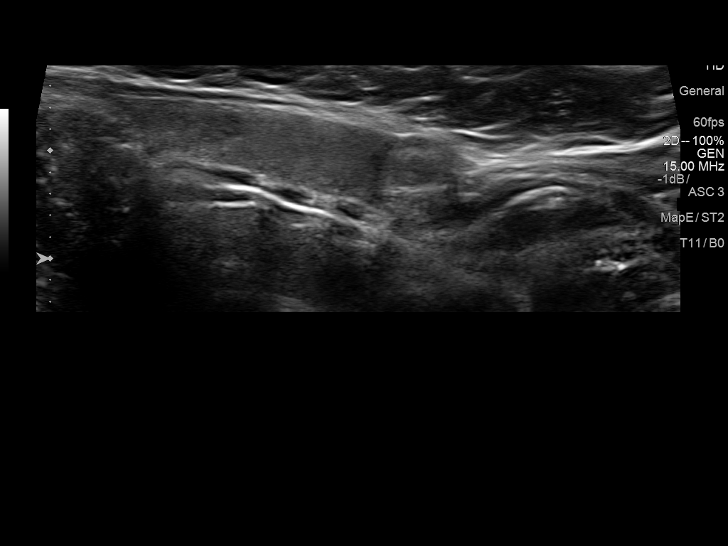
[im 34/37]
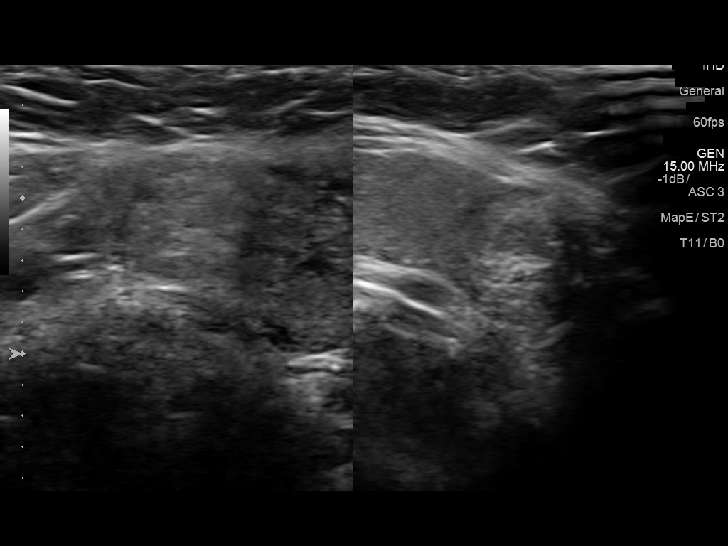
[im 37/37]
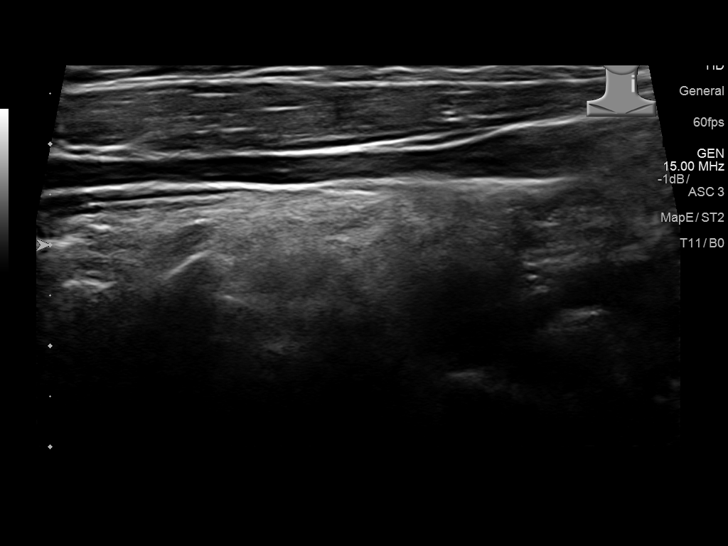

[14 of 25 positions shown; findings below may reference images not displayed]

FINDINGS: Right thyroid lobe

Surgically absent.  No residual tissue.

Left thyroid lobe

Measurements: 4.2 x 1.8 x 2.0 cm. Left mid lobe heterogeneous solid
nodule measures 2.1 x 1.4 x 1.7 cm. There is increased vascularity
within the nodule. There is also a smaller lower pole nodule
measuring 1.0 x 1.0 x 0.9 cm.

Isthmus

Thickness: 5 mm.  No nodules visualized.

Lymphadenopathy

None visualized.
IMPRESSION: Status post right hemithyroidectomy without evidence of residual or
recurrent tissue.

There are 2 nodules in the left lobe which are solid. The dominant
nodule measures 2.1 cm. Findings meet consensus criteria for biopsy.
Ultrasound-guided fine needle aspiration should be considered, as
per the consensus statement: Management of Thyroid Nodules Detected
at US: Society of Radiologists in Ultrasound Consensus Conference

## 2018-04-10 IMAGING — US US THYROID BIOPSY
1 series · 13 of 16 positions shown · non-contrast
Comparison: 02/15/2016

INDICATION: 65-year-old female with a history of thyroid nodule

EXAM:
ULTRASOUND GUIDED NEEDLE ASPIRATE BIOPSY OF THE THYROID GLAND

[Series 1: us thyroid biopsy · 0.07mm/px · 13 of 16 slices shown]
[im 1/16]
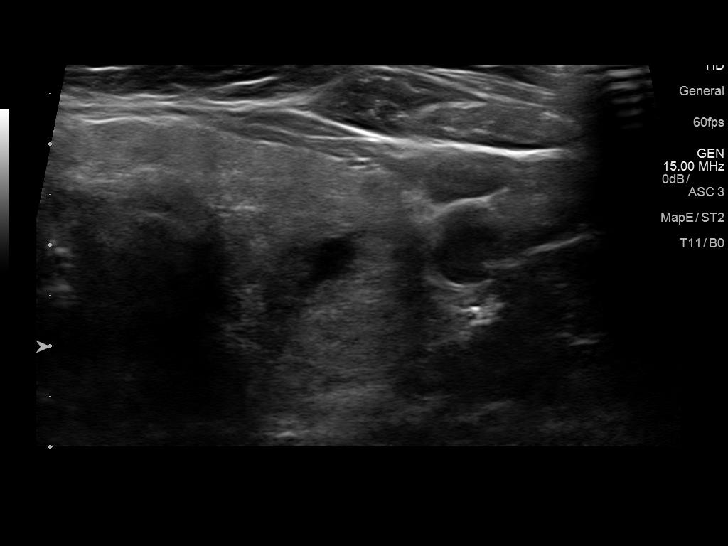
[im 2/16]
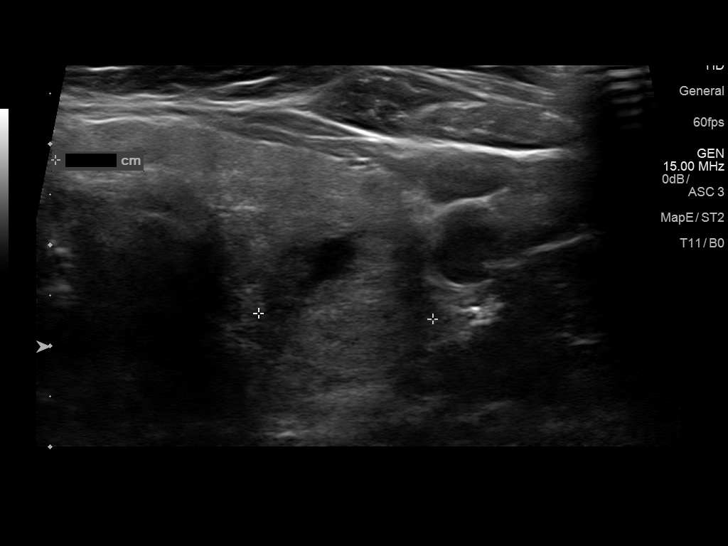
[im 4/16]
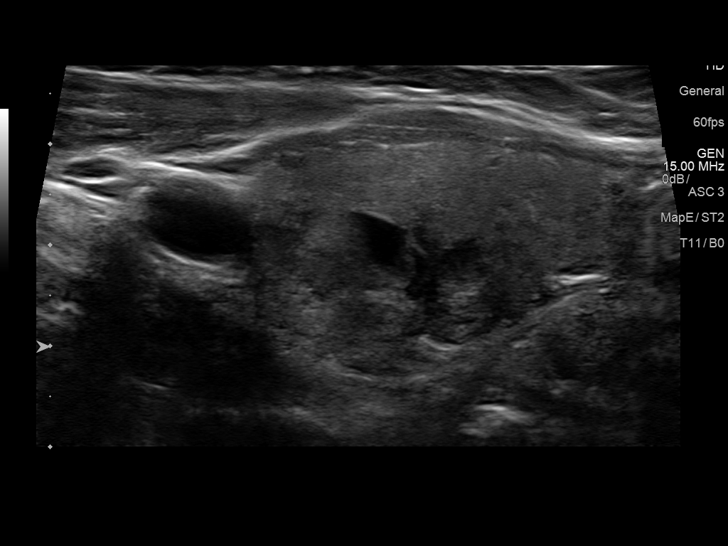
[im 5/16]
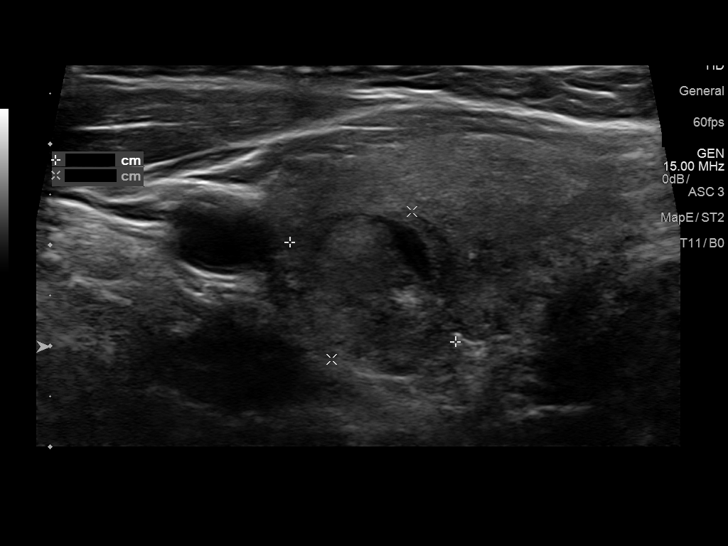
[im 6/16]
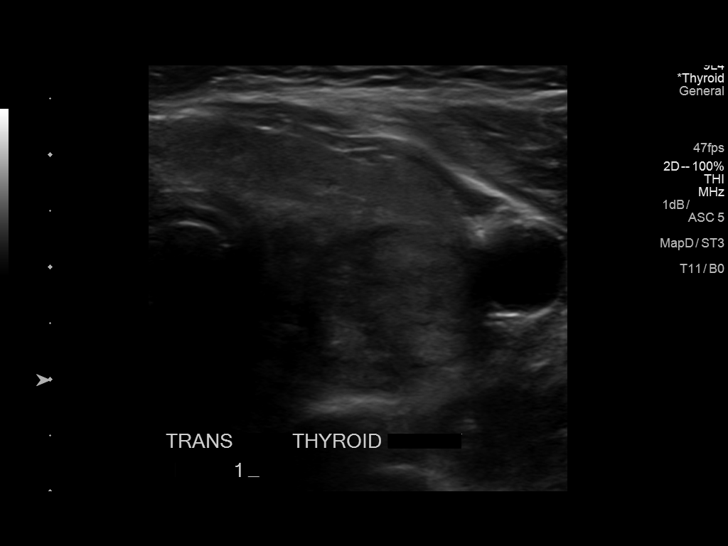
[im 7/16]
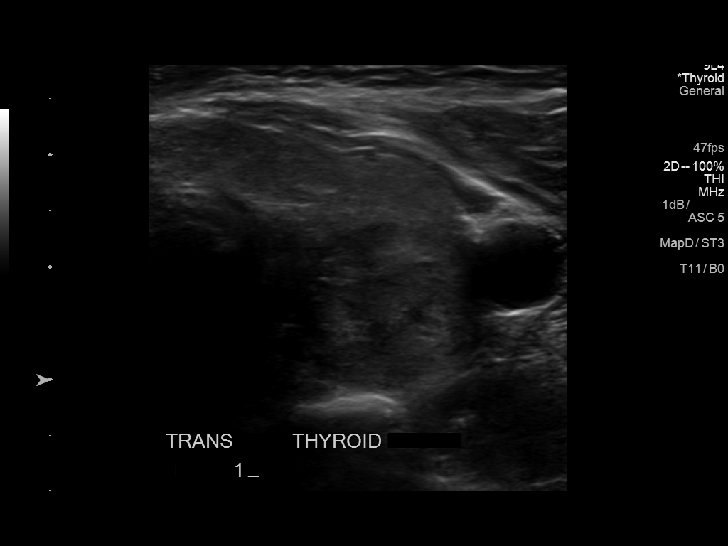
[im 9/16]
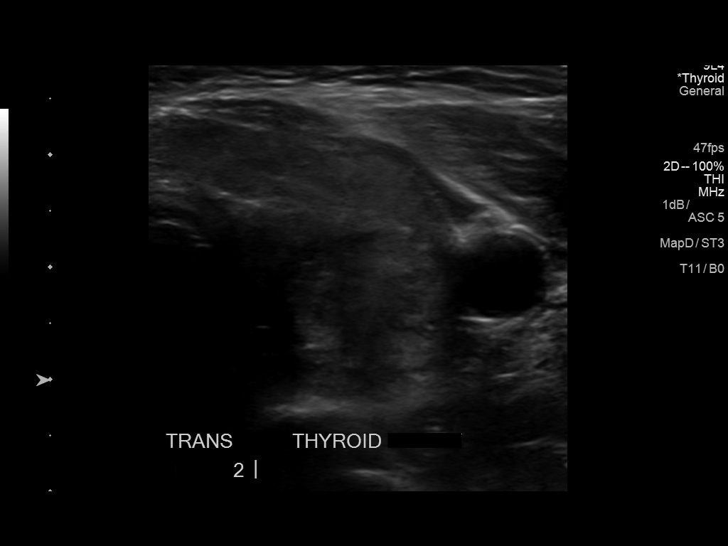
[im 10/16]
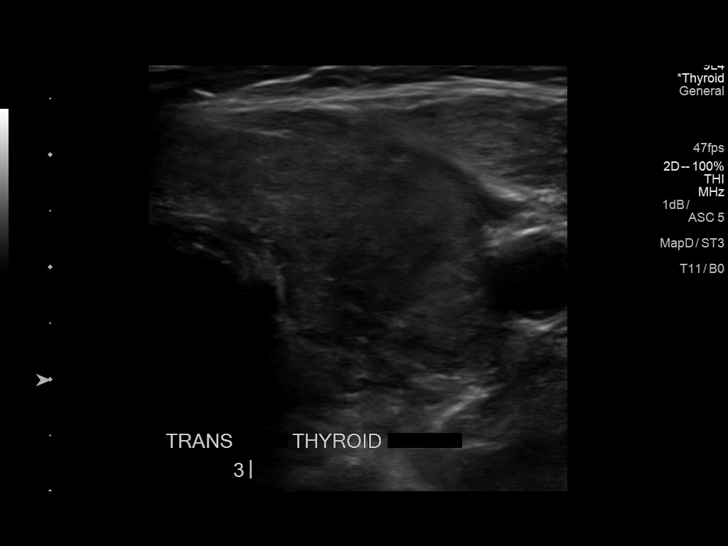
[im 11/16]
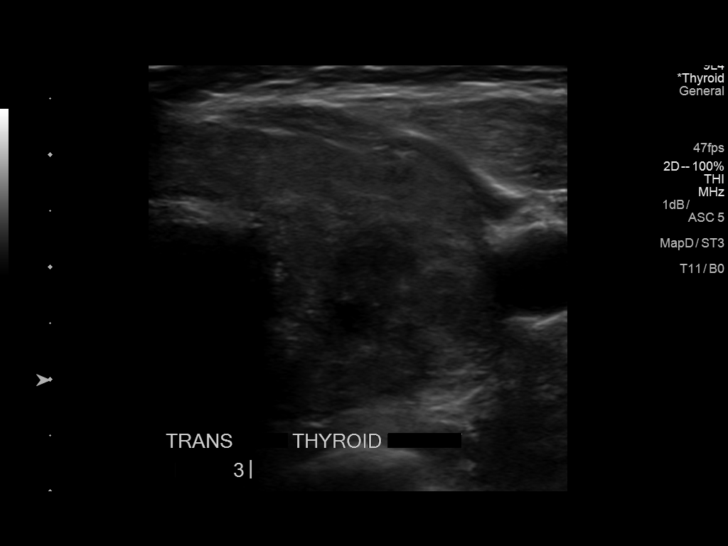
[im 12/16]
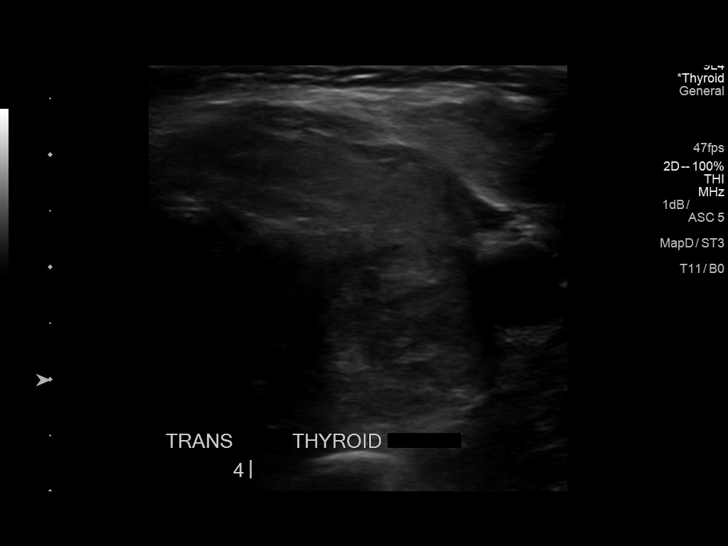
[im 13/16]
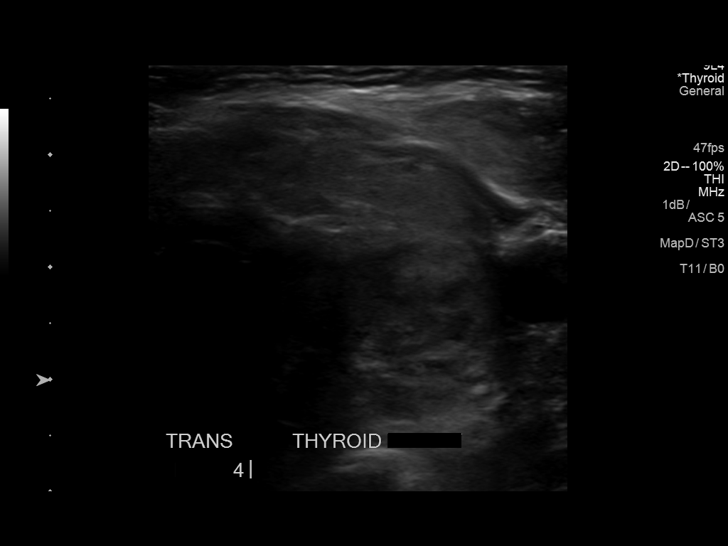
[im 15/16]
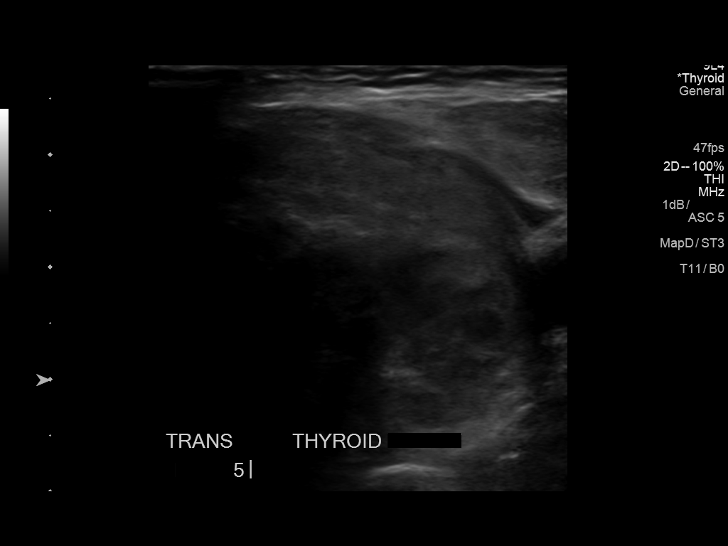
[im 16/16]
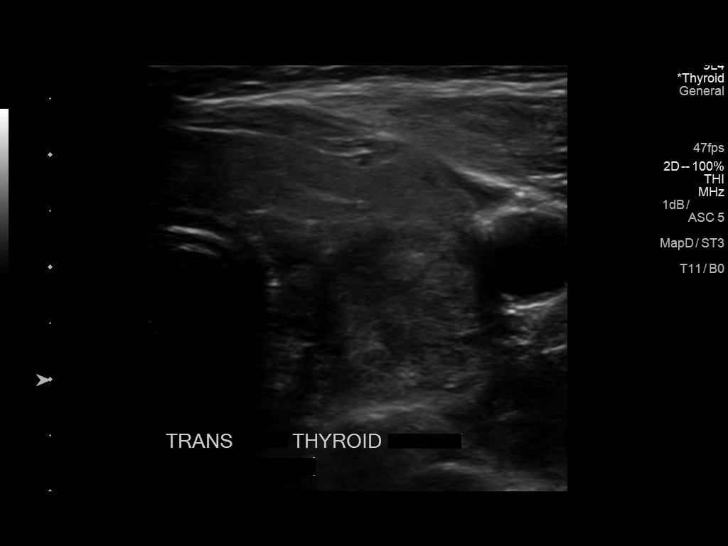

[13 of 16 positions shown; findings below may reference images not displayed]

MEDICATIONS:
1% lidocaine

COMPLICATIONS:
None

PROCEDURE:
The procedure, risks, benefits, and alternatives were explained to
the patient. Questions regarding the procedure were encouraged and
answered. The patient understands and consents to the procedure.

Ultrasound survey was performed with images stored and sent to PACs.

The left neck was prepped with Betadine in a sterile fashion, and a
sterile drape was applied covering the operative field. A sterile
gown and sterile gloves were used for the procedure. Local
anesthesia was provided with 1% Lidocaine.

Ultrasound guidance was used to infiltrate the region with 1%
lidocaine for local anesthesia. Three separate 25 gauge fine needle
biopsy were then acquired of the left thyroid nodule using
ultrasound guidance. Images were stored.

Slide preparation was performed. Cytotechnologist was present to
confirm adequacy.

Two additional separate 25 gauge fine-needle aspiration/biopsy were
acquired for AFIRMA.

Final image was stored after biopsy.

Patient tolerated the procedure well and remained hemodynamically
stable throughout.

No complications were encountered and no significant blood loss was
encounter
IMPRESSION: Status post ultrasound-guided left thyroid nodule biopsy. Tissue
specimen sent to pathology for complete histopathologic analysis.
